# Patient Record
Sex: Female | Born: 1960
Health system: Southern US, Community
[De-identification: ages and names within clinical notes are randomized; demographics above are authoritative.]

## PROBLEM LIST (undated history)

## (undated) DIAGNOSIS — Z8619 Personal history of other infectious and parasitic diseases: Secondary | ICD-10-CM

## (undated) DIAGNOSIS — F329 Major depressive disorder, single episode, unspecified: Secondary | ICD-10-CM

## (undated) DIAGNOSIS — M2550 Pain in unspecified joint: Secondary | ICD-10-CM

## (undated) DIAGNOSIS — G2581 Restless legs syndrome: Secondary | ICD-10-CM

## (undated) DIAGNOSIS — I10 Essential (primary) hypertension: Secondary | ICD-10-CM

## (undated) DIAGNOSIS — M1990 Unspecified osteoarthritis, unspecified site: Secondary | ICD-10-CM

## (undated) DIAGNOSIS — F32A Depression, unspecified: Secondary | ICD-10-CM

## (undated) DIAGNOSIS — F419 Anxiety disorder, unspecified: Secondary | ICD-10-CM

## (undated) DIAGNOSIS — C801 Malignant (primary) neoplasm, unspecified: Secondary | ICD-10-CM

## (undated) DIAGNOSIS — R42 Dizziness and giddiness: Secondary | ICD-10-CM

## (undated) DIAGNOSIS — I509 Heart failure, unspecified: Secondary | ICD-10-CM

## (undated) DIAGNOSIS — Z8719 Personal history of other diseases of the digestive system: Secondary | ICD-10-CM

## (undated) DIAGNOSIS — E119 Type 2 diabetes mellitus without complications: Secondary | ICD-10-CM

## (undated) DIAGNOSIS — K219 Gastro-esophageal reflux disease without esophagitis: Secondary | ICD-10-CM

## (undated) DIAGNOSIS — J111 Influenza due to unidentified influenza virus with other respiratory manifestations: Secondary | ICD-10-CM

## (undated) DIAGNOSIS — Z8709 Personal history of other diseases of the respiratory system: Secondary | ICD-10-CM

## (undated) HISTORY — PX: BREAST ENHANCEMENT SURGERY: SHX7

## (undated) HISTORY — PX: OTHER SURGICAL HISTORY: SHX169

## (undated) HISTORY — PX: COLONOSCOPY: SHX174

## (undated) HISTORY — PX: TONSILLECTOMY: SUR1361

## (undated) SURGERY — Surgical Case
Anesthesia: *Unknown

---

## 1999-12-15 ENCOUNTER — Encounter: Payer: Self-pay | Admitting: Family Medicine

## 1999-12-15 ENCOUNTER — Encounter: Admission: RE | Admit: 1999-12-15 | Discharge: 1999-12-15 | Payer: Self-pay | Admitting: Family Medicine

## 2002-12-21 ENCOUNTER — Encounter: Admission: RE | Admit: 2002-12-21 | Discharge: 2003-03-21 | Payer: Self-pay | Admitting: Family Medicine

## 2003-09-08 ENCOUNTER — Encounter: Admission: RE | Admit: 2003-09-08 | Discharge: 2003-09-08 | Payer: Self-pay | Admitting: Otolaryngology

## 2004-07-07 ENCOUNTER — Other Ambulatory Visit: Admission: RE | Admit: 2004-07-07 | Discharge: 2004-07-07 | Payer: Self-pay | Admitting: Family Medicine

## 2008-07-02 ENCOUNTER — Other Ambulatory Visit: Admission: RE | Admit: 2008-07-02 | Discharge: 2008-07-02 | Payer: Self-pay | Admitting: Family Medicine

## 2009-04-22 ENCOUNTER — Encounter: Admission: RE | Admit: 2009-04-22 | Discharge: 2009-04-22 | Payer: Self-pay | Admitting: Family Medicine

## 2009-09-16 ENCOUNTER — Encounter: Admission: RE | Admit: 2009-09-16 | Discharge: 2009-09-16 | Payer: Self-pay | Admitting: Family Medicine

## 2010-01-04 ENCOUNTER — Other Ambulatory Visit
Admission: RE | Admit: 2010-01-04 | Discharge: 2010-01-04 | Payer: Self-pay | Source: Home / Self Care | Admitting: Family Medicine

## 2010-02-26 ENCOUNTER — Encounter: Payer: Self-pay | Admitting: Family Medicine

## 2010-09-18 ENCOUNTER — Emergency Department: Payer: Self-pay | Admitting: *Deleted

## 2010-09-19 ENCOUNTER — Encounter (HOSPITAL_COMMUNITY): Payer: Self-pay

## 2010-09-19 ENCOUNTER — Ambulatory Visit (HOSPITAL_COMMUNITY)
Admission: RE | Admit: 2010-09-19 | Discharge: 2010-09-19 | Disposition: A | Discharge status: Home / Self Care | Payer: BC Managed Care – PPO | Source: Ambulatory Visit | Attending: Family Medicine | Admitting: Family Medicine

## 2010-09-19 ENCOUNTER — Other Ambulatory Visit (HOSPITAL_COMMUNITY): Payer: Self-pay | Admitting: Family Medicine

## 2010-09-19 DIAGNOSIS — K7689 Other specified diseases of liver: Secondary | ICD-10-CM | POA: Insufficient documentation

## 2010-09-19 DIAGNOSIS — M47814 Spondylosis without myelopathy or radiculopathy, thoracic region: Secondary | ICD-10-CM | POA: Insufficient documentation

## 2010-09-19 DIAGNOSIS — R799 Abnormal finding of blood chemistry, unspecified: Secondary | ICD-10-CM | POA: Insufficient documentation

## 2010-09-19 DIAGNOSIS — J986 Disorders of diaphragm: Secondary | ICD-10-CM

## 2010-09-19 DIAGNOSIS — R0789 Other chest pain: Secondary | ICD-10-CM | POA: Insufficient documentation

## 2010-09-19 MED ORDER — IOHEXOL 300 MG/ML  SOLN
100.0000 mL | Freq: Once | INTRAMUSCULAR | Status: AC | PRN
  Administered 2010-09-19: 100 mL via INTRAVENOUS

## 2011-09-28 ENCOUNTER — Other Ambulatory Visit (HOSPITAL_COMMUNITY)
Admission: RE | Admit: 2011-09-28 | Discharge: 2011-09-28 | Disposition: A | Discharge status: Home / Self Care | Payer: BC Managed Care – PPO | Source: Ambulatory Visit | Attending: Family Medicine | Admitting: Family Medicine

## 2011-09-28 ENCOUNTER — Other Ambulatory Visit: Payer: Self-pay | Admitting: Family Medicine

## 2011-09-28 DIAGNOSIS — Z124 Encounter for screening for malignant neoplasm of cervix: Secondary | ICD-10-CM | POA: Insufficient documentation

## 2013-03-24 ENCOUNTER — Other Ambulatory Visit: Payer: Self-pay | Admitting: Orthopaedic Surgery

## 2013-04-06 ENCOUNTER — Other Ambulatory Visit (HOSPITAL_COMMUNITY): Payer: BC Managed Care – PPO

## 2013-04-09 ENCOUNTER — Encounter (HOSPITAL_COMMUNITY)
Admission: RE | Admit: 2013-04-09 | Discharge: 2013-04-09 | Disposition: A | Discharge status: Home / Self Care | Payer: BC Managed Care – PPO | Source: Ambulatory Visit | Attending: Orthopaedic Surgery | Admitting: Orthopaedic Surgery

## 2013-04-09 ENCOUNTER — Encounter (HOSPITAL_COMMUNITY): Payer: Self-pay

## 2013-04-09 DIAGNOSIS — Z01818 Encounter for other preprocedural examination: Secondary | ICD-10-CM | POA: Insufficient documentation

## 2013-04-09 DIAGNOSIS — Z01810 Encounter for preprocedural cardiovascular examination: Secondary | ICD-10-CM | POA: Insufficient documentation

## 2013-04-09 DIAGNOSIS — Z01812 Encounter for preprocedural laboratory examination: Secondary | ICD-10-CM | POA: Insufficient documentation

## 2013-04-09 HISTORY — DX: Personal history of other diseases of the digestive system: Z87.19

## 2013-04-09 HISTORY — DX: Depression, unspecified: F32.A

## 2013-04-09 HISTORY — DX: Personal history of other diseases of the respiratory system: Z87.09

## 2013-04-09 HISTORY — DX: Essential (primary) hypertension: I10

## 2013-04-09 HISTORY — DX: Dizziness and giddiness: R42

## 2013-04-09 HISTORY — DX: Pain in unspecified joint: M25.50

## 2013-04-09 HISTORY — DX: Influenza due to unidentified influenza virus with other respiratory manifestations: J11.1

## 2013-04-09 HISTORY — DX: Unspecified osteoarthritis, unspecified site: M19.90

## 2013-04-09 HISTORY — DX: Personal history of other infectious and parasitic diseases: Z86.19

## 2013-04-09 HISTORY — DX: Gastro-esophageal reflux disease without esophagitis: K21.9

## 2013-04-09 HISTORY — DX: Major depressive disorder, single episode, unspecified: F32.9

## 2013-04-09 LAB — CBC WITH DIFFERENTIAL/PLATELET
Basophils Absolute: 0 10*3/uL (ref 0.0–0.1)
Basophils Relative: 0 % (ref 0–1)
Eosinophils Absolute: 0.2 10*3/uL (ref 0.0–0.7)
Eosinophils Relative: 2 % (ref 0–5)
HCT: 40 % (ref 36.0–46.0)
HEMOGLOBIN: 13.6 g/dL (ref 12.0–15.0)
LYMPHS ABS: 3.5 10*3/uL (ref 0.7–4.0)
LYMPHS PCT: 34 % (ref 12–46)
MCH: 31.1 pg (ref 26.0–34.0)
MCHC: 34 g/dL (ref 30.0–36.0)
MCV: 91.5 fL (ref 78.0–100.0)
Monocytes Absolute: 0.8 10*3/uL (ref 0.1–1.0)
Monocytes Relative: 7 % (ref 3–12)
NEUTROS ABS: 5.8 10*3/uL (ref 1.7–7.7)
NEUTROS PCT: 57 % (ref 43–77)
PLATELETS: 255 10*3/uL (ref 150–400)
RBC: 4.37 MIL/uL (ref 3.87–5.11)
RDW: 13.1 % (ref 11.5–15.5)
WBC: 10.3 10*3/uL (ref 4.0–10.5)

## 2013-04-09 LAB — PROTIME-INR
INR: 0.98 (ref 0.00–1.49)
PROTHROMBIN TIME: 12.8 s (ref 11.6–15.2)

## 2013-04-09 LAB — BASIC METABOLIC PANEL
BUN: 14 mg/dL (ref 6–23)
CO2: 26 mEq/L (ref 19–32)
Calcium: 9.9 mg/dL (ref 8.4–10.5)
Chloride: 101 mEq/L (ref 96–112)
Creatinine, Ser: 0.82 mg/dL (ref 0.50–1.10)
GFR calc Af Amer: >90 mL/min (ref >90)
GFR, EST NON AFRICAN AMERICAN: 80 mL/min — AB (ref >90)
Glucose, Bld: 96 mg/dL (ref 70–99)
POTASSIUM: 4.2 meq/L (ref 3.7–5.3)
SODIUM: 141 meq/L (ref 137–147)

## 2013-04-09 LAB — ABO/RH: ABO/RH(D): O POS

## 2013-04-09 LAB — URINALYSIS, ROUTINE W REFLEX MICROSCOPIC
BILIRUBIN URINE: NEGATIVE
Glucose, UA: NEGATIVE mg/dL
Hgb urine dipstick: NEGATIVE
Ketones, ur: NEGATIVE mg/dL
Leukocytes, UA: NEGATIVE
Nitrite: NEGATIVE
PH: 6 (ref 5.0–8.0)
Protein, ur: NEGATIVE mg/dL
Specific Gravity, Urine: 1.028 (ref 1.005–1.030)
Urobilinogen, UA: 0.2 mg/dL (ref 0.0–1.0)

## 2013-04-09 LAB — SURGICAL PCR SCREEN
MRSA, PCR: POSITIVE — AB
Staphylococcus aureus: POSITIVE — AB

## 2013-04-09 LAB — HCG, SERUM, QUALITATIVE: PREG SERUM: NEGATIVE

## 2013-04-09 LAB — TYPE AND SCREEN
ABO/RH(D): O POS
ANTIBODY SCREEN: NEGATIVE

## 2013-04-09 LAB — APTT: APTT: 33 s (ref 24–37)

## 2013-04-09 MED ORDER — CHLORHEXIDINE GLUCONATE 4 % EX LIQD: 60.0000 mL | Freq: Once | CUTANEOUS | Status: DC

## 2013-04-09 NOTE — Pre-Procedure Instructions (Signed)
Traci Roy  04/09/2013   Your procedure is scheduled on:  Tues, Mar 10 @ 7:30 AM  Report to [facility] Stay Entrance A  at 5:30 AM.  Call this number if you have problems the morning of surgery: [phone]   Remember:   Do not eat food or drink liquids after midnight.      Do not wear jewelry, make-up or nail polish.  Do not wear lotions, powders, or perfumes. You may wear deodorant.  Do not shave 48 hours prior to surgery.   Do not bring valuables to the hospital.  [facility] is not responsible                  for any belongings or valuables.               Contacts, dentures or bridgework may not be worn into surgery.  Leave suitcase in the car. After surgery it may be brought to your room.  For patients admitted to the hospital, discharge time is determined by your                treatment team.               Special Instructions:  [facility] - Preparing for Surgery  Before surgery, you can play an important role.  Because skin is not sterile, your skin needs to be as free of germs as possible.  You can reduce the number of germs on you skin by washing with CHG (chlorahexidine gluconate) soap before surgery.  CHG is an antiseptic cleaner which kills germs and bonds with the skin to continue killing germs even after washing.  Please DO NOT use if you have an allergy to CHG or antibacterial soaps.  If your skin becomes reddened/irritated stop using the CHG and inform your nurse when you arrive at Short Stay.  Do not shave (including legs and underarms) for at least 48 hours prior to the first CHG shower.  You may shave your face.  Please follow these instructions carefully:   1.  Shower with CHG Soap the night before surgery and the                                morning of Surgery.  2.  If you choose to wash your hair, wash your hair first as usual with your       normal shampoo.  3.  After you shampoo, rinse your hair and body thoroughly to remove the                       Shampoo.  4.  Use CHG as you would any other liquid soap.  You can apply chg directly       to the skin and wash gently with scrungie or a clean washcloth.  5.  Apply the CHG Soap to your body ONLY FROM THE NECK DOWN.        Do not use on open wounds or open sores.  Avoid contact with your eyes,       ears, mouth and genitals (private parts).  Wash genitals (private parts)       with your normal soap.  6.  Wash thoroughly, paying special attention to the area where your surgery        will be performed.  7.  Thoroughly rinse your body with warm  water from the neck down.  8.  DO NOT shower/wash with your normal soap after using and rinsing off       the CHG Soap.  9.  Pat yourself dry with a clean towel.            10.  Wear clean pajamas.            11.  Place clean sheets on your bed the night of your first shower and do not        sleep with pets.  Day of Surgery  Do not apply any lotions/deoderants the morning of surgery.  Please wear clean clothes to the hospital/surgery center.     Please read over the following fact sheets that you were given: Pain Booklet, Coughing and Deep Breathing, Blood Transfusion Information, MRSA Information and Surgical Site Infection Prevention

## 2013-04-09 NOTE — Progress Notes (Signed)
Pt doesn't have a cardiologist  Denies ever having an echo/stress test/heart cath  Denies ever having an EKG  Denies CXR in past yr  Medical MD is Dr.Webb at [facility]

## 2013-04-09 NOTE — Progress Notes (Signed)
04/09/13 1308  OBSTRUCTIVE SLEEP APNEA  Have you ever been diagnosed with sleep apnea through a sleep study? No  Do you snore loudly (loud enough to be heard through closed doors)?  1  Do you often feel tired, fatigued, or sleepy during the daytime? 0  Has anyone observed you stop breathing during your sleep? 0  Do you have, or are you being treated for high blood pressure? 1  BMI more than 35 kg/m2? 1  Age over 53 years old? 1  Neck circumference greater than 40 cm/18 inches? 1 (18 1/2)  Gender: 0  Obstructive Sleep Apnea Score 5  Score 4 or greater  Results sent to PCP

## 2013-04-10 NOTE — H&P (Signed)
TOTAL HIP ADMISSION H&P  Patient is admitted for right total hip arthroplasty.  Subjective:  Chief Complaint: right hip pain  HPI: Traci Roy, 53 y.o. female, has a history of pain and functional disability in the right hip(s) due to arthritis and patient has failed non-surgical conservative treatments for greater than 12 weeks to include NSAID's and/or analgesics, supervised PT with diminished ADL's post treatment, use of assistive devices, weight reduction as appropriate and activity modification.  Onset of symptoms was gradual starting 6 years ago with gradually worsening course since that time.The patient noted no past surgery on the right hip(s).  Patient currently rates pain in the right hip at 9 out of 10 with activity. Patient has night pain, worsening of pain with activity and weight bearing, trendelenberg gait, pain that interfers with activities of daily living and pain with passive range of motion. Patient has evidence of subchondral sclerosis, periarticular osteophytes and joint space narrowing by imaging studies. This condition presents safety issues increasing the risk of falls. This patient has had no.  There is no current active infection.  There are no active problems to display for this patient.  Past Medical History  Diagnosis Date  . Hypertension     takes Losartan and HCTZ daily  . History of bronchitis     last time many yrs ago  . Flu     end of Dec 2014  . Dizziness     occasionally and no meds  . Numbness     right leg  . Arthritis   . Joint pain   . GERD (gastroesophageal reflux disease)     occasionally and no meds required  . History of duodenal ulcer   . Depression     takes Lexapro daily  . History of shingles     Past Surgical History  Procedure Laterality Date  . Enlarged bladder as a child    . Breast enhancement surgery    . Tonsillectomy    . Cesarean section    . Colonoscopy    . Esophagogastroduodenoscopy      No prescriptions prior  to admission   Allergies  Allergen Reactions  . Adhesive [Tape]     Itching and breaks out    History  Substance Use Topics  . Smoking status: Never Smoker   . Smokeless tobacco: Not on file  . Alcohol Use: Yes     Comment: glass of wine every now and then    No family history on file.   Review of Systems  Constitutional: Negative.   HENT: Negative.   Eyes: Negative.   Respiratory: Negative.   Cardiovascular: Negative.   Gastrointestinal: Negative.   Genitourinary: Negative.   Musculoskeletal: Positive for joint pain.  Skin: Negative.   Neurological: Negative.   Endo/Heme/Allergies: Negative.   Psychiatric/Behavioral: Negative.     Objective:  Physical Exam  Constitutional: She appears well-developed.  HENT:  Head: Normocephalic.  Eyes: Pupils are equal, round, and reactive to light.  Neck: Normal range of motion.  Cardiovascular: Normal rate.   Respiratory: Effort normal.  GI: Soft.  Musculoskeletal:  Right hip exam: Very tender with any attempts of rotation.  Leg lengths equal.  No contraction.  Good sensory motor function.  Negative Homans.  Neurological: She is alert.  Skin: Skin is warm.  Psychiatric: She has a normal mood and affect.    Vital signs in last 24 hours:    Labs:   There is no height or weight on file  to calculate BMI.   Imaging Review Plain radiographs demonstrate severe degenerative joint disease of the right hip(s). The bone quality appears to be good for age and reported activity level.  Assessment/Plan:  End stage arthritis, right hip(s)  The patient history, physical examination, clinical judgement of the provider and imaging studies are consistent with end stage degenerative joint disease of the right hip(s) and total hip arthroplasty is deemed medically necessary. The treatment options including medical management, injection therapy, arthroscopy and arthroplasty were discussed at length. The risks and benefits of total hip  arthroplasty were presented and reviewed. The risks due to aseptic loosening, infection, stiffness, dislocation/subluxation,  thromboembolic complications and other imponderables were discussed.  The patient acknowledged the explanation, agreed to proceed with the plan and consent was signed. Patient is being admitted for inpatient treatment for surgery, pain control, PT, OT, prophylactic antibiotics, VTE prophylaxis, progressive ambulation and ADL's and discharge planning.The patient is planning to be discharged home with home health services

## 2013-04-13 MED ORDER — CEFAZOLIN SODIUM-DEXTROSE 2-3 GM-% IV SOLR
2.0000 g | INTRAVENOUS | Status: AC
  Administered 2013-04-14: 2 g via INTRAVENOUS
  Filled 2013-04-13: qty 50

## 2013-04-14 ENCOUNTER — Inpatient Hospital Stay (HOSPITAL_COMMUNITY)
Admission: RE | Admit: 2013-04-14 | Discharge: 2013-04-16 | DRG: 470 | Disposition: A | Discharge status: Home / Self Care | Payer: BC Managed Care – PPO | Source: Ambulatory Visit | Attending: Orthopaedic Surgery | Admitting: Orthopaedic Surgery

## 2013-04-14 ENCOUNTER — Encounter (HOSPITAL_COMMUNITY): Payer: Self-pay | Admitting: Anesthesiology

## 2013-04-14 ENCOUNTER — Inpatient Hospital Stay (HOSPITAL_COMMUNITY): Payer: BC Managed Care – PPO

## 2013-04-14 ENCOUNTER — Encounter (HOSPITAL_COMMUNITY): Payer: BC Managed Care – PPO | Admitting: Anesthesiology

## 2013-04-14 ENCOUNTER — Encounter (HOSPITAL_COMMUNITY)
Admission: RE | Disposition: A | Discharge status: Home / Self Care | Payer: Self-pay | Source: Ambulatory Visit | Attending: Orthopaedic Surgery

## 2013-04-14 ENCOUNTER — Inpatient Hospital Stay (HOSPITAL_COMMUNITY): Payer: BC Managed Care – PPO | Admitting: Anesthesiology

## 2013-04-14 DIAGNOSIS — M1610 Unilateral primary osteoarthritis, unspecified hip: Principal | ICD-10-CM | POA: Diagnosis present

## 2013-04-14 DIAGNOSIS — F3289 Other specified depressive episodes: Secondary | ICD-10-CM | POA: Diagnosis present

## 2013-04-14 DIAGNOSIS — K219 Gastro-esophageal reflux disease without esophagitis: Secondary | ICD-10-CM | POA: Diagnosis present

## 2013-04-14 DIAGNOSIS — I10 Essential (primary) hypertension: Secondary | ICD-10-CM | POA: Diagnosis present

## 2013-04-14 DIAGNOSIS — M169 Osteoarthritis of hip, unspecified: Secondary | ICD-10-CM | POA: Diagnosis present

## 2013-04-14 DIAGNOSIS — F329 Major depressive disorder, single episode, unspecified: Secondary | ICD-10-CM | POA: Diagnosis present

## 2013-04-14 HISTORY — PX: TOTAL HIP ARTHROPLASTY: SHX124

## 2013-04-14 SURGERY — ARTHROPLASTY, HIP, TOTAL, ANTERIOR APPROACH
Anesthesia: General | Site: Hip | Laterality: Right

## 2013-04-14 MED ORDER — LOSARTAN POTASSIUM 50 MG PO TABS
50.0000 mg | ORAL_TABLET | Freq: Every day | ORAL | Status: DC
  Administered 2013-04-16: 50 mg via ORAL
  Filled 2013-04-14 (×3): qty 1

## 2013-04-14 MED ORDER — MUPIROCIN 2 % EX OINT
TOPICAL_OINTMENT | Freq: Once | CUTANEOUS | Status: AC
  Administered 2013-04-14: 06:00:00 via NASAL

## 2013-04-14 MED ORDER — LACTATED RINGERS IV SOLN
INTRAVENOUS | Status: DC
  Administered 2013-04-14 – 2013-04-15 (×2): via INTRAVENOUS

## 2013-04-14 MED ORDER — KETOROLAC TROMETHAMINE 30 MG/ML IJ SOLN
15.0000 mg | Freq: Once | INTRAMUSCULAR | Status: AC | PRN
  Administered 2013-04-14: 30 mg via INTRAVENOUS

## 2013-04-14 MED ORDER — ONDANSETRON HCL 4 MG/2ML IJ SOLN
INTRAMUSCULAR | Status: DC | PRN
  Administered 2013-04-14: 4 mg via INTRAVENOUS

## 2013-04-14 MED ORDER — ONDANSETRON HCL 4 MG PO TABS: 4.0000 mg | ORAL_TABLET | Freq: Four times a day (QID) | ORAL | Status: DC | PRN

## 2013-04-14 MED ORDER — OXYCODONE HCL 5 MG/5ML PO SOLN: 5.0000 mg | Freq: Once | ORAL | Status: DC | PRN

## 2013-04-14 MED ORDER — ONDANSETRON HCL 4 MG/2ML IJ SOLN
4.0000 mg | Freq: Four times a day (QID) | INTRAMUSCULAR | Status: DC | PRN
  Administered 2013-04-14: 4 mg via INTRAVENOUS
  Filled 2013-04-14: qty 2

## 2013-04-14 MED ORDER — FENTANYL CITRATE 0.05 MG/ML IJ SOLN
INTRAMUSCULAR | Status: DC | PRN
  Administered 2013-04-14 (×4): 50 ug via INTRAVENOUS
  Administered 2013-04-14: 200 ug via INTRAVENOUS
  Administered 2013-04-14 (×2): 50 ug via INTRAVENOUS

## 2013-04-14 MED ORDER — GLYCOPYRROLATE 0.2 MG/ML IJ SOLN
INTRAMUSCULAR | Status: AC
  Filled 2013-04-14: qty 2

## 2013-04-14 MED ORDER — PHENYLEPHRINE 40 MCG/ML (10ML) SYRINGE FOR IV PUSH (FOR BLOOD PRESSURE SUPPORT)
PREFILLED_SYRINGE | INTRAVENOUS | Status: AC
  Filled 2013-04-14: qty 10

## 2013-04-14 MED ORDER — PHENYLEPHRINE HCL 10 MG/ML IJ SOLN
INTRAMUSCULAR | Status: DC | PRN
  Administered 2013-04-14 (×5): 80 ug via INTRAVENOUS

## 2013-04-14 MED ORDER — EPHEDRINE SULFATE 50 MG/ML IJ SOLN
INTRAMUSCULAR | Status: DC | PRN
  Administered 2013-04-14 (×2): 10 mg via INTRAVENOUS

## 2013-04-14 MED ORDER — PROPOFOL 10 MG/ML IV BOLUS
INTRAVENOUS | Status: DC | PRN
  Administered 2013-04-14: 200 mg via INTRAVENOUS

## 2013-04-14 MED ORDER — ONDANSETRON HCL 4 MG/2ML IJ SOLN
INTRAMUSCULAR | Status: AC
  Filled 2013-04-14: qty 2

## 2013-04-14 MED ORDER — ACETAMINOPHEN 650 MG RE SUPP: 650.0000 mg | Freq: Four times a day (QID) | RECTAL | Status: DC | PRN

## 2013-04-14 MED ORDER — HYDROCODONE-ACETAMINOPHEN 5-325 MG PO TABS
ORAL_TABLET | ORAL | Status: AC
  Administered 2013-04-14: 2 via ORAL
  Filled 2013-04-14: qty 2

## 2013-04-14 MED ORDER — ACETAMINOPHEN 500 MG PO TABS
1000.0000 mg | ORAL_TABLET | Freq: Four times a day (QID) | ORAL | Status: AC
  Administered 2013-04-14 – 2013-04-15 (×3): 1000 mg via ORAL
  Filled 2013-04-14 (×3): qty 2

## 2013-04-14 MED ORDER — FENTANYL CITRATE 0.05 MG/ML IJ SOLN
INTRAMUSCULAR | Status: AC
  Filled 2013-04-14: qty 5

## 2013-04-14 MED ORDER — BISACODYL 5 MG PO TBEC: 5.0000 mg | DELAYED_RELEASE_TABLET | Freq: Every day | ORAL | Status: DC | PRN

## 2013-04-14 MED ORDER — HYDROCODONE-ACETAMINOPHEN 5-325 MG PO TABS
1.0000 | ORAL_TABLET | ORAL | Status: DC | PRN
  Administered 2013-04-14: 2 via ORAL
  Administered 2013-04-15 – 2013-04-16 (×5): 1 via ORAL
  Filled 2013-04-14 (×5): qty 1

## 2013-04-14 MED ORDER — METHOCARBAMOL 100 MG/ML IJ SOLN
500.0000 mg | Freq: Four times a day (QID) | INTRAVENOUS | Status: DC | PRN
  Administered 2013-04-14: 500 mg via INTRAVENOUS
  Filled 2013-04-14 (×2): qty 5

## 2013-04-14 MED ORDER — ROCURONIUM BROMIDE 50 MG/5ML IV SOLN
INTRAVENOUS | Status: AC
  Filled 2013-04-14: qty 1

## 2013-04-14 MED ORDER — 0.9 % SODIUM CHLORIDE (POUR BTL) OPTIME
TOPICAL | Status: DC | PRN
  Administered 2013-04-14: 1000 mL

## 2013-04-14 MED ORDER — LIDOCAINE HCL (CARDIAC) 20 MG/ML IV SOLN
INTRAVENOUS | Status: AC
  Filled 2013-04-14: qty 5

## 2013-04-14 MED ORDER — DIPHENHYDRAMINE HCL 12.5 MG/5ML PO ELIX: 12.5000 mg | ORAL_SOLUTION | ORAL | Status: DC | PRN

## 2013-04-14 MED ORDER — HYDROMORPHONE HCL PF 1 MG/ML IJ SOLN
0.5000 mg | INTRAMUSCULAR | Status: DC | PRN
  Administered 2013-04-14 – 2013-04-15 (×3): 0.5 mg via INTRAVENOUS
  Filled 2013-04-14 (×3): qty 1

## 2013-04-14 MED ORDER — MUPIROCIN 2 % EX OINT
TOPICAL_OINTMENT | CUTANEOUS | Status: AC
  Filled 2013-04-14: qty 22

## 2013-04-14 MED ORDER — METOCLOPRAMIDE HCL 5 MG/ML IJ SOLN
5.0000 mg | Freq: Three times a day (TID) | INTRAMUSCULAR | Status: DC | PRN
  Administered 2013-04-14: 10 mg via INTRAVENOUS
  Filled 2013-04-14: qty 2

## 2013-04-14 MED ORDER — FERROUS SULFATE 325 (65 FE) MG PO TABS
325.0000 mg | ORAL_TABLET | Freq: Two times a day (BID) | ORAL | Status: DC
  Administered 2013-04-15 – 2013-04-16 (×3): 325 mg via ORAL
  Filled 2013-04-14 (×6): qty 1

## 2013-04-14 MED ORDER — LACTATED RINGERS IV SOLN
INTRAVENOUS | Status: DC | PRN
  Administered 2013-04-14 (×2): via INTRAVENOUS

## 2013-04-14 MED ORDER — TRANEXAMIC ACID 100 MG/ML IV SOLN
1000.0000 mg | INTRAVENOUS | Status: AC
  Administered 2013-04-14: 1000 mg via INTRAVENOUS
  Filled 2013-04-14: qty 10

## 2013-04-14 MED ORDER — PROMETHAZINE HCL 25 MG/ML IJ SOLN: 6.2500 mg | INTRAMUSCULAR | Status: DC | PRN

## 2013-04-14 MED ORDER — ASPIRIN EC 325 MG PO TBEC
325.0000 mg | DELAYED_RELEASE_TABLET | Freq: Two times a day (BID) | ORAL | Status: DC
  Administered 2013-04-15 – 2013-04-16 (×3): 325 mg via ORAL
  Filled 2013-04-14 (×5): qty 1

## 2013-04-14 MED ORDER — PROPOFOL 10 MG/ML IV BOLUS
INTRAVENOUS | Status: AC
  Filled 2013-04-14: qty 20

## 2013-04-14 MED ORDER — CEFAZOLIN SODIUM-DEXTROSE 2-3 GM-% IV SOLR
2.0000 g | Freq: Four times a day (QID) | INTRAVENOUS | Status: AC
  Administered 2013-04-14 (×2): 2 g via INTRAVENOUS
  Filled 2013-04-14 (×2): qty 50

## 2013-04-14 MED ORDER — MIDAZOLAM HCL 2 MG/2ML IJ SOLN: 0.5000 mg | Freq: Once | INTRAMUSCULAR | Status: DC | PRN

## 2013-04-14 MED ORDER — HYDROMORPHONE HCL PF 1 MG/ML IJ SOLN
0.2500 mg | INTRAMUSCULAR | Status: DC | PRN
  Administered 2013-04-14 (×2): 0.25 mg via INTRAVENOUS

## 2013-04-14 MED ORDER — OXYCODONE HCL 5 MG PO TABS: 5.0000 mg | ORAL_TABLET | Freq: Once | ORAL | Status: DC | PRN

## 2013-04-14 MED ORDER — MIDAZOLAM HCL 2 MG/2ML IJ SOLN
INTRAMUSCULAR | Status: AC
  Filled 2013-04-14: qty 2

## 2013-04-14 MED ORDER — MEPERIDINE HCL 25 MG/ML IJ SOLN: 6.2500 mg | INTRAMUSCULAR | Status: DC | PRN

## 2013-04-14 MED ORDER — HYDROMORPHONE HCL PF 1 MG/ML IJ SOLN: 0.2500 mg | INTRAMUSCULAR | Status: DC | PRN

## 2013-04-14 MED ORDER — HYDROCHLOROTHIAZIDE 25 MG PO TABS
25.0000 mg | ORAL_TABLET | Freq: Every day | ORAL | Status: DC
  Administered 2013-04-16: 25 mg via ORAL
  Filled 2013-04-14 (×3): qty 1

## 2013-04-14 MED ORDER — HYDROMORPHONE HCL PF 1 MG/ML IJ SOLN
INTRAMUSCULAR | Status: AC
  Filled 2013-04-14: qty 1

## 2013-04-14 MED ORDER — LACTATED RINGERS IV SOLN: INTRAVENOUS | Status: DC

## 2013-04-14 MED ORDER — ALUM & MAG HYDROXIDE-SIMETH 200-200-20 MG/5ML PO SUSP: 30.0000 mL | ORAL | Status: DC | PRN

## 2013-04-14 MED ORDER — ACETAMINOPHEN 325 MG PO TABS: 650.0000 mg | ORAL_TABLET | Freq: Four times a day (QID) | ORAL | Status: DC | PRN

## 2013-04-14 MED ORDER — DOCUSATE SODIUM 100 MG PO CAPS
100.0000 mg | ORAL_CAPSULE | Freq: Two times a day (BID) | ORAL | Status: DC
  Administered 2013-04-14 – 2013-04-16 (×4): 100 mg via ORAL
  Filled 2013-04-14 (×5): qty 1

## 2013-04-14 MED ORDER — LIDOCAINE HCL (CARDIAC) 20 MG/ML IV SOLN
INTRAVENOUS | Status: DC | PRN
  Administered 2013-04-14: 70 mg via INTRAVENOUS

## 2013-04-14 MED ORDER — EPHEDRINE SULFATE 50 MG/ML IJ SOLN
INTRAMUSCULAR | Status: AC
  Filled 2013-04-14: qty 1

## 2013-04-14 MED ORDER — MIDAZOLAM HCL 5 MG/5ML IJ SOLN
INTRAMUSCULAR | Status: DC | PRN
  Administered 2013-04-14: 2 mg via INTRAVENOUS

## 2013-04-14 MED ORDER — METOCLOPRAMIDE HCL 10 MG PO TABS: 5.0000 mg | ORAL_TABLET | Freq: Three times a day (TID) | ORAL | Status: DC | PRN

## 2013-04-14 MED ORDER — KETOROLAC TROMETHAMINE 30 MG/ML IJ SOLN
INTRAMUSCULAR | Status: AC
  Filled 2013-04-14: qty 1

## 2013-04-14 MED ORDER — PROPRANOLOL HCL 1 MG/ML IV SOLN
INTRAVENOUS | Status: AC
  Filled 2013-04-14: qty 1

## 2013-04-14 MED ORDER — ESCITALOPRAM OXALATE 10 MG PO TABS
10.0000 mg | ORAL_TABLET | Freq: Every day | ORAL | Status: DC
  Administered 2013-04-15 – 2013-04-16 (×2): 10 mg via ORAL
  Filled 2013-04-14 (×3): qty 1

## 2013-04-14 MED ORDER — NEOSTIGMINE METHYLSULFATE 1 MG/ML IJ SOLN
INTRAMUSCULAR | Status: AC
  Filled 2013-04-14: qty 10

## 2013-04-14 MED ORDER — MUPIROCIN 2 % EX OINT: TOPICAL_OINTMENT | Freq: Two times a day (BID) | CUTANEOUS | Status: DC

## 2013-04-14 MED ORDER — PHENOL 1.4 % MT LIQD: 1.0000 | OROMUCOSAL | Status: DC | PRN

## 2013-04-14 MED ORDER — NEOSTIGMINE METHYLSULFATE 1 MG/ML IJ SOLN
INTRAMUSCULAR | Status: DC | PRN
  Administered 2013-04-14: 3 mg via INTRAVENOUS

## 2013-04-14 MED ORDER — METHOCARBAMOL 500 MG PO TABS
500.0000 mg | ORAL_TABLET | Freq: Four times a day (QID) | ORAL | Status: DC | PRN
  Administered 2013-04-14 – 2013-04-15 (×4): 500 mg via ORAL
  Filled 2013-04-14 (×5): qty 1

## 2013-04-14 MED ORDER — MENTHOL 3 MG MT LOZG: 1.0000 | LOZENGE | OROMUCOSAL | Status: DC | PRN

## 2013-04-14 MED ORDER — ROCURONIUM BROMIDE 100 MG/10ML IV SOLN
INTRAVENOUS | Status: DC | PRN
  Administered 2013-04-14: 10 mg via INTRAVENOUS
  Administered 2013-04-14: 40 mg via INTRAVENOUS

## 2013-04-14 MED ORDER — GLYCOPYRROLATE 0.2 MG/ML IJ SOLN
INTRAMUSCULAR | Status: DC | PRN
  Administered 2013-04-14: 0.4 mg via INTRAVENOUS

## 2013-04-14 SURGICAL SUPPLY — 51 items
BLADE SAW SGTL 18X1.27X75 (BLADE) ×2 IMPLANT
BLADE SURG ROTATE 9660 (MISCELLANEOUS) IMPLANT
CAPT HIP PF COP ×1 IMPLANT
CELLS DAT CNTRL 66122 CELL SVR (MISCELLANEOUS) ×1 IMPLANT
COVER SURGICAL LIGHT HANDLE (MISCELLANEOUS) ×2 IMPLANT
DRAPE C-ARM 42X72 X-RAY (DRAPES) ×2 IMPLANT
DRAPE STERI IOBAN 125X83 (DRAPES) ×2 IMPLANT
DRAPE U-SHAPE 47X51 STRL (DRAPES) ×6 IMPLANT
DRSG AQUACEL AG ADV 3.5X10 (GAUZE/BANDAGES/DRESSINGS) ×2 IMPLANT
DURAPREP 26ML APPLICATOR (WOUND CARE) ×2 IMPLANT
ELECT BLADE 4.0 EZ CLEAN MEGAD (MISCELLANEOUS)
ELECT CAUTERY BLADE 6.4 (BLADE) ×2 IMPLANT
ELECT REM PT RETURN 9FT ADLT (ELECTROSURGICAL) ×2
ELECTRODE BLDE 4.0 EZ CLN MEGD (MISCELLANEOUS) IMPLANT
ELECTRODE REM PT RTRN 9FT ADLT (ELECTROSURGICAL) ×1 IMPLANT
FACESHIELD LNG OPTICON STERILE (SAFETY) ×4 IMPLANT
GLOVE BIO SURGEON STRL SZ7 (GLOVE) ×1 IMPLANT
GLOVE BIO SURGEON STRL SZ8 (GLOVE) ×2 IMPLANT
GLOVE BIO SURGEON STRL SZ8.5 (GLOVE) ×2 IMPLANT
GLOVE BIOGEL M 7.0 STRL (GLOVE) ×1 IMPLANT
GLOVE BIOGEL PI IND STRL 7.0 (GLOVE) IMPLANT
GLOVE BIOGEL PI IND STRL 8 (GLOVE) ×1 IMPLANT
GLOVE BIOGEL PI IND STRL 8.5 (GLOVE) ×1 IMPLANT
GLOVE BIOGEL PI INDICATOR 7.0 (GLOVE) ×1
GLOVE BIOGEL PI INDICATOR 8 (GLOVE) ×1
GLOVE BIOGEL PI INDICATOR 8.5 (GLOVE) ×1
GOWN STRL REUS W/ TWL LRG LVL3 (GOWN DISPOSABLE) ×2 IMPLANT
GOWN STRL REUS W/ TWL XL LVL3 (GOWN DISPOSABLE) ×1 IMPLANT
GOWN STRL REUS W/TWL LRG LVL3 (GOWN DISPOSABLE) ×4
GOWN STRL REUS W/TWL XL LVL3 (GOWN DISPOSABLE) ×4
KIT BASIN OR (CUSTOM PROCEDURE TRAY) ×2 IMPLANT
KIT ROOM TURNOVER OR (KITS) ×2 IMPLANT
MANIFOLD NEPTUNE II (INSTRUMENTS) ×2 IMPLANT
NS IRRIG 1000ML POUR BTL (IV SOLUTION) ×2 IMPLANT
PACK TOTAL JOINT (CUSTOM PROCEDURE TRAY) ×2 IMPLANT
PAD ARMBOARD 7.5X6 YLW CONV (MISCELLANEOUS) ×4 IMPLANT
RETRACTOR WND ALEXIS 18 MED (MISCELLANEOUS) ×1 IMPLANT
RTRCTR WOUND ALEXIS 18CM MED (MISCELLANEOUS) ×2
STAPLER VISISTAT 35W (STAPLE) ×2 IMPLANT
SUT ETHIBOND NAB CT1 #1 30IN (SUTURE) ×4 IMPLANT
SUT VIC AB 0 CT1 27 (SUTURE)
SUT VIC AB 0 CT1 27XBRD ANBCTR (SUTURE) IMPLANT
SUT VIC AB 1 CT1 27 (SUTURE) ×2
SUT VIC AB 1 CT1 27XBRD ANBCTR (SUTURE) ×1 IMPLANT
SUT VIC AB 2-0 CT1 27 (SUTURE) ×2
SUT VIC AB 2-0 CT1 TAPERPNT 27 (SUTURE) ×1 IMPLANT
SUT VLOC 180 0 24IN GS25 (SUTURE) ×2 IMPLANT
TOWEL OR 17X24 6PK STRL BLUE (TOWEL DISPOSABLE) ×2 IMPLANT
TOWEL OR 17X26 10 PK STRL BLUE (TOWEL DISPOSABLE) ×4 IMPLANT
TRAY FOLEY CATH 14FR (SET/KITS/TRAYS/PACK) IMPLANT
WATER STERILE IRR 1000ML POUR (IV SOLUTION) ×4 IMPLANT

## 2013-04-14 NOTE — Progress Notes (Signed)
Utilization review completed.  

## 2013-04-14 NOTE — Addendum Note (Signed)
Addendum created 04/14/13 1131 by Purvis Kilts, CRNA   Modules edited: Anesthesia Medication Administration

## 2013-04-14 NOTE — Op Note (Signed)
PRE-OP DIAGNOSIS:  RIGHT HIP DEGENERATIVE JOINT DISEASE POST-OP DIAGNOSIS:  same PROCEDURE: RIGHT TOTAL HIP ARTHROPLASTY ANTERIOR APPROACH ANESTHESIA:  General SURGEON:  Melrose Nakayama MD ASSISTANT:  Loni Dolly PA-C   INDICATIONS FOR PROCEDURE:  The patient is a 53 y.o. female with a long history of a painful hip.  This has persisted despite multiple conservative measures.  The patient has persisted with pain and dysfunction making rest and activity difficult.  A total hip replacement is offered as surgical treatment.  Informed operative consent was obtained after discussion of possible complications including reaction to anesthesia, infection, neurovascular injury, dislocation, DVT, PE, and death.  The importance of the postoperative rehab program to optimize result was stressed with the patient.  SUMMARY OF FINDINGS AND PROCEDURE:  Under general anesthesia through a anterior approach an the Hana table a right THR was performed.  The patient had severe degenerative change and excellent bone quality.  We used DePuy components to replace the hip and these were size KA10 Corail femur capped with a +1 65mm ceramic hip ball.  On the acetabular side we used a size 48 Gription shell with a  plus 4 neutral polyethylene liner.  We did use a hole eliminator.  Loni Dolly PA-C assisted throughout and was invaluable to the completion of the case in that he helped position and retract while I performed the procedure.  He also closed simultaneously to help minimize OR time.  I used fluoroscopy throughout the case to check position of implants and leg lengths and read all of these views myself.  DESCRIPTION OF PROCEDURE:  The patient was taken to the OR suite where general anesthetic was applied.  The patient was then positioned on the Hana table supine.  All bony prominences were appropriately padded.  Prep and drape was then performed in normal sterile fashion.  The patient was given Kefzol preoperative antibiotic  and an appropriate time out was performed.  We then took an anterior approach to the right hip.  Dissection was taken through adipose to the tensor fascia lata fascia.  This structure was incised longitudinally and we dissected in the intermuscular interval just medial to this muscle.  Cobra retractors were placed superior and inferior to the femoral neck superficial to the capsule.  A capsular incision was then made and the retractors were placed along the femoral neck.  Xray was brought in to get a good level for the femoral neck cut which was made with an oscillating saw and osteotome.  The femoral head was removed with a corkscrew.  The acetabulum was exposed and some labral tissues were excised. Reaming was taken to the inside wall of the pelvis and sequentially up to 1 mm smaller than the actual component.  A trial of components was done and then the aforementioned acetabular shell was placed in appropriate tilt and anteversion confirmed by fluoroscopy. The liner was placed along with the hole eliminator and attention was turned to the femur.  The leg was brought down and over into adduction and the elevator bar was used to raise the femur up gently in the wound.  The piriformis was released with care taken to preserve the obturator internus attachment and all of the posterior capsule. The femur was reamed and then broached to the appropriate size.  A trial reduction was done and the aforementioned head and neck assembly gave [place] the best stability in extension with external rotation.  Leg lengths were felt to be about equal by fluoroscopic exam.  The trial components were removed and the wound irrigated.  We then placed the femoral component in appropriate anteversion.  The head was applied to a dry stem neck and the hip again reduced.  It was again stable in the aforementioned position.  The would was irrigated again followed by re-approximation of anterior capsule with ethibond suture. Tensor fascia was  repaired with V-loc suture  followed by subcutaneous closure with #O and #2 undyed vicryl.  Skin was closed with staples followed by a sterile dressing.  EBL and IOF can be obtained from anesthesia records.  DISPOSITION:  The patient was extubated in the OR and taken to PACU in stable condition to be admitted to the Orthopedic Surgery for appropriate post-op care to include perioperative antibiotics and DVT prophylaxis.

## 2013-04-14 NOTE — Interval H&P Note (Signed)
History and Physical Interval Note:  04/14/2013 7:25 AM  Traci Roy  has presented today for surgery, with the diagnosis of RIGHT HIP DEGENERATIVE JOINT DISEASE  The various methods of treatment have been discussed with the patient and family. After consideration of risks, benefits and other options for treatment, the patient has consented to  Procedure(s): TOTAL HIP ARTHROPLASTY ANTERIOR APPROACH (Right) as a surgical intervention .  The patient's history has been reviewed, patient examined, no change in status, stable for surgery.  I have reviewed the patient's chart and labs.  Questions were answered to the patient's satisfaction.     Taunja Brickner G

## 2013-04-14 NOTE — Evaluation (Signed)
Physical Therapy Evaluation Patient Details Name: Traci Roy MRN: [id number] DOB: 04/30/1960 Today's Date: 04/14/2013 Time: 8676-7209 PT Time Calculation (min): 34 min  PT Assessment / Plan / Recommendation History of Present Illness  Pt is a 53 y/o female admitted s/p THA anterior approach.   Clinical Impression  This patient presents with acute pain and decreased functional independence following the above mentioned procedure. At the time of PT eval, pt was limited by N/V and induced vomiting with finger down her throat to gain some relief. Functionally, pt did well with transfer from bed>chair. This patient is appropriate for skilled PT interventions to address functional limitations, improve safety and independence with functional mobility, and return to PLOF.     PT Assessment  Patient needs continued PT services    Follow Up Recommendations  Home health PT    Does the patient have the potential to tolerate intense rehabilitation      Barriers to Discharge        Equipment Recommendations  Rolling walker with 5" wheels;3in1 (PT)    Recommendations for Other Services     Frequency 7X/week    Precautions / Restrictions Precautions Precautions: Fall Precaution Comments: Direct anterior hip Restrictions Weight Bearing Restrictions: Yes RLE Weight Bearing: Weight bearing as tolerated   Pertinent Vitals/Pain Main complaint during session was N/V, and complained of minimal pain throughout       Mobility  Bed Mobility Overal bed mobility: Needs Assistance Bed Mobility: Supine to Sit Supine to sit: Min assist General bed mobility comments: VC's for sequencing and technique. Assist for movement and support of RLE while pt transitioned to EOB.  Transfers Overall transfer level: Needs assistance Equipment used: Rolling walker (2 wheeled) Transfers: Sit to/from [facility] Sit to Stand: Min assist Stand pivot transfers: Min assist General transfer  comment: VC's for hand placement on seated surface for safety. Assist to power-up to full stand, as well as for walker positioning while pivoting from bed to chair.     Exercises     PT Diagnosis: Difficulty walking;Acute pain  PT Problem List: Decreased strength;Decreased range of motion;Decreased activity tolerance;Decreased balance;Decreased mobility;Decreased knowledge of use of DME;Decreased safety awareness;Pain PT Treatment Interventions: Gait training;Stair training;DME instruction;Therapeutic activities;Functional mobility training;Therapeutic exercise;Neuromuscular re-education;Patient/family education     PT Goals(Current goals can be found in the care plan section) Acute Rehab PT Goals Patient Stated Goal: To feel better PT Goal Formulation: With patient/family Time For Goal Achievement: 04/28/13 Potential to Achieve Goals: Good  Visit Information  Last PT Received On: 04/14/13 Assistance Needed: +1 History of Present Illness: Pt is a 53 y/o female admitted s/p THA anterior approach.        Prior [place] expects to be discharged to:: Private residence Living Arrangements: Spouse/significant other Available Help at Discharge: Friend(s);Available 24 hours/day Type of Home: House Home Access: Stairs to enter [employer] of Steps: 4 Entrance Stairs-Rails: Right;Left Home Layout: One level Home Equipment: None Prior Function Level of Independence: Needs assistance ADL's / Homemaking Assistance Needed: Putting socks and shoes on Communication Communication: No difficulties Dominant Hand: Right    Cognition  Cognition Arousal/Alertness: Awake/alert Behavior During Therapy: WFL for tasks assessed/performed Overall Cognitive Status: Within Functional Limits for tasks assessed    Extremity/Trunk Assessment Upper Extremity Assessment Upper Extremity Assessment: Defer to OT evaluation Lower Extremity Assessment Lower  Extremity Assessment: RLE deficits/detail RLE Deficits / Details: Decreased strength and AROM consistent with THA RLE: Unable to fully assess due to  pain Cervical / Trunk Assessment Cervical / Trunk Assessment: Normal   Balance General Comments General comments (skin integrity, edema, etc.): Pt very nauseated during session and stuck finger down throat to initiate vomiting. Therapist encouraged pt not to induce vomiting but pt reports she needs relief.   End of Session PT - End of Session Equipment Utilized During Treatment: Gait belt Activity Tolerance: Other (comment) (Limited by N/V) Patient left: in chair;with call bell/phone within reach;with family/visitor present Nurse Communication: Mobility status  GP     Jolyn Lent 04/14/2013, 3:57 PM  Jolyn Lent, PT, DPT Acute Rehabilitation Services Pager: [phone]

## 2013-04-14 NOTE — Transfer of Care (Signed)
Immediate Anesthesia Transfer of Care Note  Patient: Traci Roy  Procedure(s) Performed: Procedure(s): TOTAL HIP ARTHROPLASTY ANTERIOR APPROACH (Right)  Patient Location: PACU  Anesthesia Type:General  Level of Consciousness: awake  Airway & Oxygen Therapy: Patient Spontanous Breathing and Patient connected to face mask oxygen  Post-op Assessment: Report given to PACU RN and Post -op Vital signs reviewed and stable  Post vital signs: Reviewed and stable  Complications: No apparent anesthesia complications

## 2013-04-14 NOTE — Preoperative (Signed)
Beta Blockers   Reason not to administer Beta Blockers:Not Applicable 

## 2013-04-14 NOTE — Anesthesia Postprocedure Evaluation (Signed)
Anesthesia Post Note  Patient: Traci Roy  Procedure(s) Performed: Procedure(s) (LRB): TOTAL HIP ARTHROPLASTY ANTERIOR APPROACH (Right)  Anesthesia type: GA  Patient location: PACU  Post pain: Pain level controlled  Post assessment: Post-op Vital signs reviewed  Last Vitals:  Filed Vitals:   04/14/13 1020  BP: 135/60  Pulse: 88  Temp:   Resp: 10    Post vital signs: Reviewed  Level of consciousness: sedated  Complications: No apparent anesthesia complications

## 2013-04-14 NOTE — Anesthesia Preprocedure Evaluation (Addendum)
Anesthesia Evaluation  Patient identified by MRN, date of birth, ID band Patient awake    Reviewed: Allergy & Precautions, H&P , Patient's Chart, lab work & pertinent test results, reviewed documented beta blocker date and time   History of Anesthesia Complications Negative for: history of anesthetic complications  Airway Mallampati: II TM Distance: >3 FB Neck ROM: full    Dental  (+) Teeth Intact, Dental Advisory Given   Pulmonary  breath sounds clear to auscultation        Cardiovascular Exercise Tolerance: Good hypertension, Rhythm:regular Rate:Normal     Neuro/Psych    GI/Hepatic GERD-  Controlled,  Endo/Other  Morbid obesity  Renal/GU      Musculoskeletal   Abdominal   Peds  Hematology   Anesthesia Other Findings   Reproductive/Obstetrics                         Anesthesia Physical Anesthesia Plan  ASA: III  Anesthesia Plan: General ETT   Post-op Pain Management:    Induction: Intravenous  Airway Management Planned: Oral ETT  Additional Equipment:   Intra-op Plan:   Post-operative Plan: Extubation in OR  Informed Consent: I have reviewed the patients History and Physical, chart, labs and discussed the procedure including the risks, benefits and alternatives for the proposed anesthesia with the patient or authorized representative who has indicated his/her understanding and acceptance.   Dental Advisory Given and Dental advisory given  Plan Discussed with: CRNA and Surgeon  Anesthesia Plan Comments:        Anesthesia Quick Evaluation

## 2013-04-15 LAB — BASIC METABOLIC PANEL
BUN: 11 mg/dL (ref 6–23)
CHLORIDE: 101 meq/L (ref 96–112)
CO2: 26 mEq/L (ref 19–32)
Calcium: 8.7 mg/dL (ref 8.4–10.5)
Creatinine, Ser: 0.95 mg/dL (ref 0.50–1.10)
GFR calc Af Amer: 78 mL/min — ABNORMAL LOW (ref >90)
GFR calc non Af Amer: 67 mL/min — ABNORMAL LOW (ref >90)
Glucose, Bld: 123 mg/dL — ABNORMAL HIGH (ref 70–99)
Potassium: 3.6 mEq/L — ABNORMAL LOW (ref 3.7–5.3)
Sodium: 140 mEq/L (ref 137–147)

## 2013-04-15 LAB — CBC
HEMATOCRIT: 32.7 % — AB (ref 36.0–46.0)
HEMOGLOBIN: 10.7 g/dL — AB (ref 12.0–15.0)
MCH: 30.8 pg (ref 26.0–34.0)
MCHC: 32.7 g/dL (ref 30.0–36.0)
MCV: 94.2 fL (ref 78.0–100.0)
PLATELETS: 252 10*3/uL (ref 150–400)
RBC: 3.47 MIL/uL — AB (ref 3.87–5.11)
RDW: 13.7 % (ref 11.5–15.5)
WBC: 10.7 10*3/uL — AB (ref 4.0–10.5)

## 2013-04-15 NOTE — Progress Notes (Signed)
Physical Therapy Treatment Patient Details Name: YATZIL CLIPPINGER MRN: [id number] DOB: Oct 31, 1960 Today's Date: 04/15/2013 Time: 0931-1001 PT Time Calculation (min): 30 min  PT Assessment / Plan / Recommendation  History of Present Illness Pt is a 53 y/o female admitted s/p THA anterior approach.    PT Comments   Pt progressing towards physical therapy goals. Was able to tolerate ambulation and therapeutic exercise with no complaints of increased pain. Overall, pt states she feels better than yesterday and demonstrated a good rehab effort.   Follow Up Recommendations  Home health PT     Does the patient have the potential to tolerate intense rehabilitation     Barriers to Discharge        Equipment Recommendations  Rolling walker with 5" wheels;3in1 (PT)    Recommendations for Other Services    Frequency 7X/week   Progress towards PT Goals Progress towards PT goals: Progressing toward goals  Plan Current plan remains appropriate    Precautions / Restrictions Precautions Precautions: Fall Precaution Comments: Direct anterior hip Restrictions Weight Bearing Restrictions: Yes RLE Weight Bearing: Weight bearing as tolerated   Pertinent Vitals/Pain Pt received pain medication prior to PT session, and had little complaints of pain throughout session.     Mobility  Bed Mobility Overal bed mobility: Needs Assistance Bed Mobility: Supine to Sit Supine to sit: Min assist General bed mobility comments: VC's for sequencing and technique. Assist for movement and support of RLE while pt transitioned to EOB.  Transfers Overall transfer level: Needs assistance Equipment used: Rolling walker (2 wheeled) Transfers: Sit to/from Stand Sit to Stand: Min guard General transfer comment: VC's for hand placement on seated surface for safety. Pt able to achieve full standing without assist, however increased time needed.  Ambulation/Gait Ambulation/Gait assistance: Min guard Ambulation  Distance (Feet): 75 Feet Assistive device: Rolling walker (2 wheeled) Gait Pattern/deviations: Step-to pattern;Decreased stride length Gait velocity: Decreased Gait velocity interpretation: Below normal speed for age/gender General Gait Details: VC's for sequencing and safety awareness with the RW. Specific cues for increased heel strike and quad activation during weight bearing.     Exercises Total Joint Exercises Ankle Circles/Pumps: 10 reps Quad Sets: 10 reps Short Arc Quad: 10 reps Heel Slides: 10 reps Hip ABduction/ADduction: 10 reps   PT Diagnosis:    PT Problem List:   PT Treatment Interventions:     PT Goals (current goals can now be found in the care plan section) Acute Rehab PT Goals Patient Stated Goal: To walk farther PT Goal Formulation: With patient/family Time For Goal Achievement: 04/28/13 Potential to Achieve Goals: Good  Visit Information  Last PT Received On: 04/15/13 Assistance Needed: +1 History of Present Illness: Pt is a 53 y/o female admitted s/p THA anterior approach.     Subjective Data  Patient Stated Goal: To walk farther   Cognition  Cognition Arousal/Alertness: Awake/alert Behavior During Therapy: WFL for tasks assessed/performed Overall Cognitive Status: Within Functional Limits for tasks assessed    Balance  Balance Overall balance assessment: No apparent balance deficits (not formally assessed)  End of Session PT - End of Session Equipment Utilized During Treatment: Gait belt Activity Tolerance: Patient tolerated treatment well Patient left: in chair;with call bell/phone within reach Nurse Communication: Mobility status   GP     Jolyn Lent 04/15/2013, 10:09 AM  Jolyn Lent, PT, DPT Acute Rehabilitation Services Pager: [phone]

## 2013-04-15 NOTE — Evaluation (Signed)
Occupational Therapy Evaluation Patient Details Name: Traci Roy MRN: [id number] DOB: May 18, 1960 Today's Date: 04/15/2013 Time: 1012-1059 OT Time Calculation (min): 47 min  OT Assessment / Plan / Recommendation History of present illness Pt is a 53 y/o female admitted s/p THA anterior approach.    Clinical Impression   Pt presents with R hip pain, weakness, and impaired balance interfering with mobility and ADL,.  Began education in use of AE for LB ADL and toileting.  Will likely need tub equipment as well. Will follow.    OT Assessment  Patient needs continued OT Services    Follow Up Recommendations  Home health OT    Barriers to Discharge      Equipment Recommendations  3 in 1 bedside comode (tub equipment TBA)    Recommendations for Other Services    Frequency  Min 2X/week    Precautions / Restrictions Precautions Precautions: Fall Precaution Comments: Direct anterior hip Restrictions Weight Bearing Restrictions: Yes RLE Weight Bearing: Weight bearing as tolerated   Pertinent Vitals/Pain 4-5/10 R hip, premedicated, iced, repositioned, VSS.    ADL  Eating/Feeding: Independent Where Assessed - Eating/Feeding: Chair Grooming: Teeth care;Wash/dry hands;Wash/dry face;Set up Where Assessed - Grooming: Unsupported sitting Upper Body Bathing: Set up Where Assessed - Upper Body Bathing: Unsupported sitting Lower Body Bathing: Maximal assistance Where Assessed - Lower Body Bathing: Unsupported sitting;Supported sit to stand Upper Body Dressing: Set up Where Assessed - Upper Body Dressing: Unsupported sitting Lower Body Dressing: Maximal assistance Where Assessed - Lower Body Dressing: Unsupported sitting;Supported sit to stand Toilet Transfer: Minimal assistance Toilet Transfer Method: Sit to stand Toilet Transfer Equipment: Bedside commode Toileting - Clothing Manipulation and Hygiene: Maximal assistance Where Assessed - Best boy and  Hygiene: Sit to stand from 3-in-1 or toilet Equipment Used: Gait belt;Rolling walker;Reacher;Long-handled sponge;Long-handled shoe horn;Sock aid Transfers/Ambulation Related to ADLs: min assist with RW ADL Comments: Educated pt in use of AE for LE bathing and dressing and toilet aides, would like to practice more with sock aide.  Pt likely to need tub equipment, will demonstrate use of tub transfer bench.    OT Diagnosis: Generalized weakness;Acute pain  OT Problem List: Decreased strength;Decreased activity tolerance;Impaired balance (sitting and/or standing);Decreased safety awareness;Decreased knowledge of use of DME or AE;Obesity;Pain OT Treatment Interventions: Self-care/ADL training;DME and/or AE instruction;Patient/family education;Balance training   OT Goals(Current goals can be found in the care plan section) Acute Rehab OT Goals Patient Stated Goal: To walk farther OT Goal Formulation: With patient Time For Goal Achievement: 04/22/13 Potential to Achieve Goals: Good ADL Goals Pt Will Perform Grooming: with supervision;standing Pt Will Perform Lower Body Bathing: with supervision;with adaptive equipment;sit to/from stand Pt Will Perform Lower Body Dressing: with supervision;sit to/from stand;with adaptive equipment Pt Will Transfer to Toilet: with supervision;ambulating;bedside commode (over toilet) Pt Will Perform Toileting - Clothing Manipulation and hygiene: with supervision;sit to/from stand;with adaptive equipment Pt Will Perform Tub/Shower Transfer: Tub transfer;with min assist;tub bench;ambulating  Visit Information  Last OT Received On: 04/15/13 Assistance Needed: +1 History of Present Illness: Pt is a 53 y/o female admitted s/p THA anterior approach.        Prior [place] expects to be discharged to:: Private residence Living Arrangements: Spouse/significant other;Children Available Help at Discharge: Friend(s);Available 24  hours/day Type of Home: House Home Access: Stairs to enter [employer] of Steps: 4 Entrance Stairs-Rails: Right;Left Home Layout: One level Home Equipment: None Prior Function Level of Independence: Needs assistance ADL's / Homemaking  Assistance Needed: Putting socks and shoes on R foot Communication Communication: No difficulties Dominant Hand: Right         Vision/Perception Vision - History Patient Visual Report: No change from baseline   Cognition  Cognition Arousal/Alertness: Awake/alert Behavior During Therapy: WFL for tasks assessed/performed Overall Cognitive Status: Within Functional Limits for tasks assessed    Extremity/Trunk Assessment Upper Extremity Assessment Upper Extremity Assessment: Overall WFL for tasks assessed Lower Extremity Assessment Lower Extremity Assessment: Defer to PT evaluation Cervical / Trunk Assessment Cervical / Trunk Assessment: Normal     Mobility Bed Mobility Overal bed mobility:  (not assessed, pt in chair)  Transfers Overall transfer level: Needs assistance Equipment used: Rolling walker (2 wheeled) Transfers: Sit to/from Stand Sit to Stand: Min guard General transfer comment: VC's for hand placement on seated surface for safety. Pt able to achieve full standing without assist, however increased time needed.      Exercise    Balance Balance Overall balance assessment: Needs assistance Sitting-balance support: Feet supported Sitting balance-Leahy Scale: Good Standing balance support: During functional activity Standing balance-Leahy Scale: Fair   End of Session OT - End of Session Activity Tolerance: Patient limited by pain Patient left: in chair;with call bell/phone within reach Nurse Communication:  (IV leaking)  GO     Malka So 04/15/2013, 11:09 AM [phone]

## 2013-04-15 NOTE — Progress Notes (Signed)
Physical Therapy Treatment Patient Details Name: Traci Roy MRN: [id number] DOB: 04-02-1960 Today's Date: 04/15/2013 Time: 1235-1300 PT Time Calculation (min): 25 min  PT Assessment / Plan / Recommendation  History of Present Illness Pt is a 53 y/o female admitted s/p THA anterior approach.    PT Comments   Pt is making slow, but steady progress. Pt needs continued focus on endurance and strengthening. Pt reports she is having a difficult time lifting her leg during gait and is concerned about steps. Will attempt step training tomorrow. Recommend continued acute PT until d/c home with HHPT.  Follow Up Recommendations  Home health PT     Does the patient have the potential to tolerate intense rehabilitation     Barriers to Discharge        Equipment Recommendations  Rolling walker with 5" wheels;3in1 (PT)    Recommendations for Other Services    Frequency 7X/week   Progress towards PT Goals Progress towards PT goals: Progressing toward goals  Plan Current plan remains appropriate    Precautions / Restrictions Precautions Precautions: Fall Precaution Comments: Direct anterior hip Restrictions Weight Bearing Restrictions: Yes RLE Weight Bearing: Weight bearing as tolerated   Pertinent Vitals/Pain    Mobility  Bed Mobility Overal bed mobility: Needs Assistance Bed Mobility: Supine to Sit;Sit to Supine Supine to sit: Min assist Sit to supine: Min assist General bed mobility comments: VC's for sequencing and technique. Assist for movement and support of RLE while pt transitioned to EOB.  Transfers Overall transfer level: Needs assistance Equipment used: Rolling walker (2 wheeled) Transfers: Sit to/from Stand Sit to Stand: Min guard General transfer comment: VC's for hand placement on seated surface for safety. Ambulation/Gait Ambulation/Gait assistance: Supervision Ambulation Distance (Feet): 85 Feet Assistive device: Rolling walker (2 wheeled) Gait  Pattern/deviations: Step-to pattern;Decreased stride length Gait velocity: Decreased Gait velocity interpretation: Below normal speed for age/gender General Gait Details: multiple standing rest breaks due to arms being tired. Reinforced deceased WB on UE and more on LEs to reduce UE fatigue.     Exercises Total Joint Exercises Ankle Circles/Pumps: 10 reps Quad Sets: AROM;Strengthening;Both;10 reps;Supine Short Arc Quad: 10 reps Heel Slides: AROM;Right;10 reps;Supine Hip ABduction/ADduction: AAROM;Strengthening;Right;10 reps Long Arc Quad: AROM;Strengthening;Right;10 reps;Seated   PT Diagnosis:    PT Problem List:   PT Treatment Interventions:     PT Goals (current goals can now be found in the care plan section) Acute Rehab PT Goals Patient Stated Goal: To walk farther PT Goal Formulation: With patient/family Time For Goal Achievement: 04/28/13 Potential to Achieve Goals: Good  Visit Information  Last PT Received On: 04/15/13 Assistance Needed: +1 History of Present Illness: Pt is a 53 y/o female admitted s/p THA anterior approach.     Subjective Data  Patient Stated Goal: To walk farther   Cognition  Cognition Arousal/Alertness: Awake/alert Behavior During Therapy: WFL for tasks assessed/performed Overall Cognitive Status: Within Functional Limits for tasks assessed    Balance  Balance Overall balance assessment: Needs assistance Sitting-balance support: Feet supported Sitting balance-Leahy Scale: Good Standing balance support: During functional activity Standing balance-Leahy Scale: Fair  End of Session PT - End of Session Equipment Utilized During Treatment: Gait belt Activity Tolerance: Patient tolerated treatment well Patient left: in bed;with call bell/phone within reach Nurse Communication: Mobility status   GP     Lelon Mast 04/15/2013, 1:02 PM

## 2013-04-15 NOTE — Plan of Care (Signed)
Problem: Consults Goal: Diagnosis- Total Joint Replacement Primary Total Hip Right     

## 2013-04-15 NOTE — Progress Notes (Signed)
Subjective: 1 Day Post-Op Procedure(s) (LRB): TOTAL HIP ARTHROPLASTY ANTERIOR APPROACH (Right)  Activity level:  wbat Diet tolerance:  Eating well Voiding:  ok Patient reports pain as 3 on 0-10 scale.    Objective: Vital signs in last 24 hours: Temp:  [98.2 F (36.8 C)-99.1 F (37.3 C)] 98.2 F (36.8 C) (03/11 0738) Pulse Rate:  [57-115] 76 (03/11 0738) Resp:  [10-18] 18 (03/11 0738) BP: (98-136)/(47-77) 107/50 mmHg (03/11 0738) SpO2:  [95 %-100 %] 97 % (03/11 0738)  Labs:  Recent Labs  04/15/13 0602  HGB 10.7*    Recent Labs  04/15/13 0602  WBC 10.7*  RBC 3.47*  HCT 32.7*  PLT 252    Recent Labs  04/15/13 0602  NA 140  K 3.6*  CL 101  CO2 26  BUN 11  CREATININE 0.95  GLUCOSE 123*  CALCIUM 8.7   No results found for this basename: LABPT, INR,  in the last 72 hours  Physical Exam:  Neurologically intact ABD soft Neurovascular intact Sensation intact distally Dorsiflexion/Plantar flexion intact Incision: dressing C/D/I No cellulitis present  Assessment/Plan:  1 Day Post-Op Procedure(s) (LRB): TOTAL HIP ARTHROPLASTY ANTERIOR APPROACH (Right) Advance diet Up with therapy D/C IV fluids Plan for discharge tomorrow Discharge home with home health Cont asa 325mg  BID x 4weeks Dressing change     Deania Siguenza PAUL 04/15/2013, 8:12 AM

## 2013-04-16 ENCOUNTER — Encounter (HOSPITAL_COMMUNITY): Payer: Self-pay | Admitting: Orthopaedic Surgery

## 2013-04-16 LAB — CBC
HCT: 30.3 % — ABNORMAL LOW (ref 36.0–46.0)
Hemoglobin: 10.1 g/dL — ABNORMAL LOW (ref 12.0–15.0)
MCH: 31.4 pg (ref 26.0–34.0)
MCHC: 33.3 g/dL (ref 30.0–36.0)
MCV: 94.1 fL (ref 78.0–100.0)
Platelets: 204 10*3/uL (ref 150–400)
RBC: 3.22 MIL/uL — ABNORMAL LOW (ref 3.87–5.11)
RDW: 13.6 % (ref 11.5–15.5)
WBC: 10.1 10*3/uL (ref 4.0–10.5)

## 2013-04-16 MED ORDER — ASPIRIN 325 MG PO TBEC: 325.0000 mg | DELAYED_RELEASE_TABLET | Freq: Two times a day (BID) | ORAL | Status: DC

## 2013-04-16 MED ORDER — HYDROCODONE-ACETAMINOPHEN 5-325 MG PO TABS: 1.0000 | ORAL_TABLET | Freq: Four times a day (QID) | ORAL | Status: DC | PRN

## 2013-04-16 MED ORDER — METHOCARBAMOL 500 MG PO TABS: 500.0000 mg | ORAL_TABLET | Freq: Four times a day (QID) | ORAL | Status: DC | PRN

## 2013-04-16 NOTE — Progress Notes (Signed)
Physical Therapy Treatment Patient Details Name: Traci Roy MRN: [id number] DOB: 1960-03-29 Today's Date: 04/16/2013 Time: 0938-1829 PT Time Calculation (min): 34 min  PT Assessment / Plan / Recommendation  History of Present Illness Pt is a 53 y/o female admitted s/p THA anterior approach.    PT Comments   Pt progressing towards physical therapy goals. Overall mobility is improving, however pt still limited by decreased tolerance for functional activity. Specific complaints include UE fatigue with using the RW. Encouraging pt to increase weight bearing on surgical LE and therefore off weight her UE's on the walker.   Follow Up Recommendations  Home health PT     Does the patient have the potential to tolerate intense rehabilitation     Barriers to Discharge        Equipment Recommendations  Rolling walker with 5" wheels;3in1 (PT)    Recommendations for Other Services    Frequency 7X/week   Progress towards PT Goals Progress towards PT goals: Progressing toward goals  Plan Current plan remains appropriate    Precautions / Restrictions Precautions Precautions: Fall Restrictions Weight Bearing Restrictions: Yes RLE Weight Bearing: Weight bearing as tolerated   Pertinent Vitals/Pain Pt reports minimal pain throughout session.     Mobility  Bed Mobility Overal bed mobility: Needs Assistance Bed Mobility: Supine to Sit Supine to sit: Supervision General bed mobility comments: Pt able to transition to EOB without physical assist. Increased time needed; HOB flat and use of bed rails permitted. Transfers Overall transfer level: Needs assistance Equipment used: Rolling walker (2 wheeled) Transfers: Sit to/from Stand Sit to Stand: Min guard General transfer comment: VC's for hand placement on seated surface for safety.  Ambulation/Gait Ambulation/Gait assistance: Min guard Ambulation Distance (Feet): 90 Feet Assistive device: Rolling walker (2 wheeled) Gait  Pattern/deviations: Step-to pattern;Decreased stride length Gait velocity: Decreased Gait velocity interpretation: Below normal speed for age/gender General Gait Details: Pt required >5 standing rest breaks and 1 seated rest break during gait training. Pt ambulated 45 feet first bout, and 45 feet second bout.  Stairs: Yes Stairs assistance: Min guard Stair Management: Two rails;Forwards Number of Stairs: 5 General stair comments: VC's for sequencing and safety awareness.    Exercises Total Joint Exercises Ankle Circles/Pumps: 10 reps Quad Sets: 10 reps Heel Slides: 10 reps Hip ABduction/ADduction: 10 reps Long Arc Quad: 10 reps   PT Diagnosis:    PT Problem List:   PT Treatment Interventions:     PT Goals (current goals can now be found in the care plan section) Acute Rehab PT Goals Patient Stated Goal: To walk farther PT Goal Formulation: With patient/family Time For Goal Achievement: 04/28/13 Potential to Achieve Goals: Good  Visit Information  Last PT Received On: 04/16/13 Assistance Needed: +1 History of Present Illness: Pt is a 53 y/o female admitted s/p THA anterior approach.     Subjective Data  Subjective: "Am I doing alright? Do you think I'm ok to go home?" Patient Stated Goal: To walk farther   Cognition  Cognition Arousal/Alertness: Awake/alert Behavior During Therapy: WFL for tasks assessed/performed Overall Cognitive Status: Within Functional Limits for tasks assessed    Balance  Balance Overall balance assessment: Needs assistance Sitting-balance support: Feet supported Sitting balance-Leahy Scale: Good Standing balance support: Bilateral upper extremity supported Standing balance-Leahy Scale: Fair  End of Session PT - End of Session Equipment Utilized During Treatment: Gait belt Activity Tolerance: Patient tolerated treatment well Patient left: in bed;with call bell/phone within reach Nurse Communication: Mobility status  GP     Jolyn Lent 04/16/2013, 1:49 PM  Jolyn Lent, PT, DPT Acute Rehabilitation Services Pager: [phone]

## 2013-04-16 NOTE — Discharge Summary (Signed)
Patient ID: Traci Roy MRN: [id number] DOB/AGE: [birth year] 53 y.o.  Admit date: 04/14/2013 Discharge date: 04/16/2013  Admission Diagnoses:  Active Problems:   Degenerative joint disease (DJD) of hip   Discharge Diagnoses:  Same  Past Medical History  Diagnosis Date  . Hypertension     takes Losartan and HCTZ daily  . History of bronchitis     last time many yrs ago  . Flu     end of Dec 2014  . Dizziness     occasionally and no meds  . Numbness     right leg  . Arthritis   . Joint pain   . GERD (gastroesophageal reflux disease)     occasionally and no meds required  . History of duodenal ulcer   . Depression     takes Lexapro daily  . History of shingles     Surgeries: Procedure(s): TOTAL HIP ARTHROPLASTY ANTERIOR APPROACH on 04/14/2013   Consultants:    Discharged Condition: Improved  Hospital Course: Traci Roy is an 53 y.o. female who was admitted 04/14/2013 for operative treatment of<principal problem not specified>. Patient has severe unremitting pain that affects sleep, daily activities, and work/hobbies. After pre-op clearance the patient was taken to the operating room on 04/14/2013 and underwent  Procedure(s): TOTAL HIP ARTHROPLASTY ANTERIOR APPROACH.    Patient was given perioperative antibiotics: Anti-infectives   Start     Dose/Rate Route Frequency Ordered Stop   04/14/13 1400  ceFAZolin (ANCEF) IVPB 2 g/50 mL premix     2 g 100 mL/hr over 30 Minutes Intravenous Every 6 hours 04/14/13 1207 04/14/13 2247   04/14/13 0600  ceFAZolin (ANCEF) IVPB 2 g/50 mL premix     2 g 100 mL/hr over 30 Minutes Intravenous On call to O.R. 04/13/13 1432 04/14/13 7253       Patient was given sequential compression devices, early ambulation, and chemoprophylaxis to prevent DVT.  Patient benefited maximally from hospital stay and there were no complications.    Recent vital signs: Patient Vitals for the past 24 hrs:  BP Temp Temp src Pulse Resp SpO2   04/16/13 0601 130/58 mmHg 98.7 F (37.1 C) Oral 90 18 98 %  04/16/13 0400 - - - - 18 96 %  04/16/13 0000 - - - - 18 97 %  04/15/13 2000 - - - - 18 99 %  04/15/13 1948 127/62 mmHg 98.4 F (36.9 C) Oral 89 18 99 %  04/15/13 1300 103/44 mmHg 98.1 F (36.7 C) Oral 84 18 100 %  04/15/13 1200 - - - - 18 92 %     Recent laboratory studies:  Recent Labs  04/15/13 0602 04/16/13 0600  WBC 10.7* 10.1  HGB 10.7* 10.1*  HCT 32.7* 30.3*  PLT 252 204  NA 140  --   K 3.6*  --   CL 101  --   CO2 26  --   BUN 11  --   CREATININE 0.95  --   GLUCOSE 123*  --   CALCIUM 8.7  --      Discharge Medications:     Medication List         acetaminophen 500 MG tablet  Commonly known as:  TYLENOL  Take 1,000 mg by mouth at bedtime as needed for mild pain.     aspirin 325 MG EC tablet  Take 1 tablet (325 mg total) by mouth 2 (two) times daily after a meal.     escitalopram 10 MG tablet  Commonly known as:  LEXAPRO  Take 10 mg by mouth daily.     hydrochlorothiazide 25 MG tablet  Commonly known as:  HYDRODIURIL  Take 25 mg by mouth daily.     HYDROcodone-acetaminophen 5-325 MG per tablet  Commonly known as:  NORCO/VICODIN  Take 1-2 tablets by mouth every 6 (six) hours as needed for moderate pain (breakthrough pain).     losartan 50 MG tablet  Commonly known as:  COZAAR  Take 50 mg by mouth daily.     methocarbamol 500 MG tablet  Commonly known as:  ROBAXIN  Take 1 tablet (500 mg total) by mouth every 6 (six) hours as needed for muscle spasms.        Diagnostic Studies: Dg Chest 2 View  04/09/2013   CLINICAL DATA:  PREOP  EXAM: CHEST  2 VIEW  COMPARISON:  CT ANGIO CHEST W/CM &/OR WO/CM dated 09/19/2010  FINDINGS: The heart size and mediastinal contours are within normal limits. Both lungs are clear. The visualized skeletal structures are unremarkable.  IMPRESSION: No active cardiopulmonary disease.   Electronically Signed   By: Margaree Mackintosh M.D.   On: 04/09/2013 14:27   Dg Hip  Operative Right  04/14/2013   CLINICAL DATA Post right-sided total hip replacement  EXAM DG OPERATIVE RIGHT HIP  FLUOROSCOPY TIME 25 seconds  COMPARISON DG FEMUR*R* dated 09/16/2009  FINDINGS Two spot intraoperative fluoroscopic images of the right hip and lower pelvis are provided for review. Images demonstrate the sequela of right total hip replacement. Alignment appears near anatomic on this solitary anterior projection radiograph. No definite fracture. There is expected subcutaneous emphysema about the operative site. No definite radiopaque foreign body.  IMPRESSION Post right total hip replacement without evidence of complication.  SIGNATURE  Electronically Signed   By: Sandi Mariscal M.D.   On: 04/14/2013 14:32    Disposition: Final discharge disposition not confirmed      Discharge Orders   Future Orders Complete By Expires   Call MD / Call 911  As directed    Comments:     If you experience chest pain or shortness of breath, CALL 911 and be transported to the hospital emergency room.  If you develope a fever above 101 F, pus (white drainage) or increased drainage or redness at the wound, or calf pain, call your surgeon's office.   Constipation Prevention  As directed    Comments:     Drink plenty of fluids.  Prune juice may be helpful.  You may use a stool softener, such as Colace (over the counter) 100 mg twice a day.  Use MiraLax (over the counter) for constipation as needed.   Diet - low sodium heart healthy  As directed    Discharge instructions  As directed    Comments:     Ice, elevation Reinforce dressing as needed ASA 325mg  BID x 4 weeks Office follow up 2 weeks   Increase activity slowly as tolerated  As directed       Follow-up Information   Follow up with Hessie Dibble, MD. Call in 2 weeks.   Specialty:  Orthopedic Surgery   Contact information:   [place] [address] [phone]        Signed: Rich Fuchs 04/16/2013, 11:11 AM

## 2013-04-16 NOTE — Progress Notes (Signed)
Subjective: 2 Days Post-Op Procedure(s) (LRB): TOTAL HIP ARTHROPLASTY ANTERIOR APPROACH (Right)  Activity level:  wbat Diet tolerance:  ok Voiding:  ok Patient reports pain as 2 on 0-10 scale.    Objective: Vital signs in last 24 hours: Temp:  [98.1 F (36.7 C)-98.7 F (37.1 C)] 98.7 F (37.1 C) (03/12 0601) Pulse Rate:  [84-90] 90 (03/12 0601) Resp:  [18] 18 (03/12 0601) BP: (103-130)/(44-62) 130/58 mmHg (03/12 0601) SpO2:  [92 %-100 %] 98 % (03/12 0601)  Labs:  Recent Labs  04/15/13 0602 04/16/13 0600  HGB 10.7* 10.1*    Recent Labs  04/15/13 0602 04/16/13 0600  WBC 10.7* 10.1  RBC 3.47* 3.22*  HCT 32.7* 30.3*  PLT 252 204    Recent Labs  04/15/13 0602  NA 140  K 3.6*  CL 101  CO2 26  BUN 11  CREATININE 0.95  GLUCOSE 123*  CALCIUM 8.7   No results found for this basename: LABPT, INR,  in the last 72 hours  Physical Exam:  Neurologically intact ABD soft Neurovascular intact Sensation intact distally Dorsiflexion/Plantar flexion intact Incision: dressing C/D/I No cellulitis present  Assessment/Plan:  2 Days Post-Op Procedure(s) (LRB): TOTAL HIP ARTHROPLASTY ANTERIOR APPROACH (Right) Advance diet Up with therapy Discharge home with home health Plan on discharge today after PT visit this afternoon Continue ASA 325mg  BID x 4 weeks    Roizy Harold, Larwance Sachs 04/16/2013, 11:04 AM

## 2013-04-16 NOTE — Progress Notes (Signed)
Physical Therapy Treatment Patient Details Name: Traci Roy MRN: [id number] DOB: [birth year] Today's Date: 04/16/2013 Time: 0175-1025 PT Time Calculation (min): 14 min  PT Assessment / Plan / Recommendation  History of Present Illness Pt is a 53 y/o female admitted s/p THA anterior approach.    PT Comments   Pt anticipates d/c home this afternoon, and states she will be ambulating around room to prepare to leave. Because of this, pt declining any OOB activity at this time. Therapist issued HEP handout and discussed car transfer and safety awareness upon returning home.   Follow Up Recommendations  Home health PT     Does the patient have the potential to tolerate intense rehabilitation     Barriers to Discharge        Equipment Recommendations  Rolling walker with 5" wheels;3in1 (PT)    Recommendations for Other Services    Frequency 7X/week   Progress towards PT Goals Progress towards PT goals: Progressing toward goals  Plan Current plan remains appropriate    Precautions / Restrictions Precautions Precautions: Fall Precaution Comments: Direct anterior hip Restrictions Weight Bearing Restrictions: Yes RLE Weight Bearing: Weight bearing as tolerated   Pertinent Vitals/Pain Pt does not complain of pain throughout session.     Mobility  Bed Mobility Overal bed mobility: Needs Assistance Bed Mobility: Supine to Sit Supine to sit: Supervision General bed mobility comments: NT - pt declined any OOB. Transfers Overall transfer level: Needs assistance Equipment used: Rolling walker (2 wheeled) Transfers: Sit to/from Stand Sit to Stand: Min guard General transfer comment: NT - pt declined any OOB. Ambulation/Gait Ambulation/Gait assistance: Min guard Ambulation Distance (Feet): 90 Feet Assistive device: Rolling walker (2 wheeled) Gait Pattern/deviations: Step-to pattern;Decreased stride length Gait velocity: Decreased Gait velocity interpretation: Below normal speed  for age/gender General Gait Details: Pt required >5 standing rest breaks and 1 seated rest break during gait training. Pt ambulated 45 feet first bout, and 45 feet second bout.  Stairs: Yes Stairs assistance: Min guard Stair Management: Two rails;Forwards Number of Stairs: 5 General stair comments: VC's for sequencing and safety awareness.    Exercises Total Joint Exercises Ankle Circles/Pumps: 10 reps Quad Sets: 10 reps Towel Squeeze: 10 reps Short Arc Quad: 10 reps Heel Slides: 10 reps Hip ABduction/ADduction: 10 reps Long Arc Quad: 10 reps   PT Diagnosis:    PT Problem List:   PT Treatment Interventions:     PT Goals (current goals can now be found in the care plan section) Acute Rehab PT Goals Patient Stated Goal: To return home this afternoon PT Goal Formulation: With patient/family Time For Goal Achievement: 04/28/13 Potential to Achieve Goals: Good  Visit Information  Last PT Received On: 04/16/13 Assistance Needed: +1 History of Present Illness: Pt is a 53 y/o female admitted s/p THA anterior approach.     Subjective Data  Subjective: "I think I'm ok. I'll be walking around to get ready to leave, I don't need to do any more before I go." Patient Stated Goal: To return home this afternoon   Cognition  Cognition Arousal/Alertness: Awake/alert Behavior During Therapy: WFL for tasks assessed/performed Overall Cognitive Status: Within Functional Limits for tasks assessed    Balance  Balance Overall balance assessment: Needs assistance Sitting-balance support: Feet supported Sitting balance-Leahy Scale: Good Standing balance support: Bilateral upper extremity supported Standing balance-Leahy Scale: Fair General Comments General comments (skin integrity, edema, etc.): Issued HEP and discussed reps/sets. Pt practiced a few reps of each.   End of Session  PT - End of Session Equipment Utilized During Treatment: Gait belt Activity Tolerance: Patient tolerated  treatment well Patient left: in bed;with call bell/phone within reach Nurse Communication: Mobility status   GP     Jolyn Lent 04/16/2013, 4:17 PM  Jolyn Lent, PT, DPT Acute Rehabilitation Services Pager: [phone]

## 2013-04-16 NOTE — Progress Notes (Signed)
OT Cancellation Note  Patient Details Name: Traci Roy MRN: [id number] DOB: [birth year]   Cancelled Treatment:    Reason Eval/Treat Not Completed: Other (comment) Pt declined OT, states no further concerns for ADLs and has 24/7 assistance at home. Acute OT to sign off.   Juluis Rainier [phone] 04/16/2013, 1:17 PM

## 2013-04-16 NOTE — Care Management Note (Signed)
CARE MANAGEMENT NOTE 04/16/2013  Patient:  LOYALTY, ARENTZ   Account Number:  [id number]  Date Initiated:  04/16/2013  Documentation initiated by:  Ricki Miller  Subjective/Objective Assessment:   53 yr old female s/p right total hip arthroplasty.     Action/Plan:   Case Manager spoke to patient and husband concerning home health and DME needs at discharge. Choice offered. Referral called to [facility], [place]. patient has family support at discharge.   Anticipated DC Date:  04/17/2013   Anticipated DC Plan:  [place]  CM consult      [facility] Choice  HOME HEALTH  DURABLE MEDICAL EQUIPMENT   Choice offered to / List presented to:  C-1 Patient   DME arranged  3-N-1  [facility]      DME agency  [employer]     HH arranged  HH-2 PT      [place].   Status of service:  Completed, signed off Medicare Important Message given?   (If response is "NO", the following Medicare IM given date fields will be blank) Date Medicare IM given:   Date Additional Medicare IM given:    Discharge Disposition:  [place]

## 2013-05-24 ENCOUNTER — Encounter (HOSPITAL_COMMUNITY): Payer: Self-pay | Admitting: Emergency Medicine

## 2013-05-24 ENCOUNTER — Emergency Department (HOSPITAL_COMMUNITY): Payer: BC Managed Care – PPO

## 2013-05-24 ENCOUNTER — Emergency Department (HOSPITAL_COMMUNITY)
Admission: EM | Admit: 2013-05-24 | Discharge: 2013-05-24 | Disposition: A | Discharge status: Home / Self Care | Payer: BC Managed Care – PPO | Attending: Emergency Medicine | Admitting: Emergency Medicine

## 2013-05-24 DIAGNOSIS — Z7982 Long term (current) use of aspirin: Secondary | ICD-10-CM | POA: Insufficient documentation

## 2013-05-24 DIAGNOSIS — Z8619 Personal history of other infectious and parasitic diseases: Secondary | ICD-10-CM | POA: Insufficient documentation

## 2013-05-24 DIAGNOSIS — F329 Major depressive disorder, single episode, unspecified: Secondary | ICD-10-CM | POA: Insufficient documentation

## 2013-05-24 DIAGNOSIS — Z79899 Other long term (current) drug therapy: Secondary | ICD-10-CM | POA: Insufficient documentation

## 2013-05-24 DIAGNOSIS — K5732 Diverticulitis of large intestine without perforation or abscess without bleeding: Secondary | ICD-10-CM | POA: Insufficient documentation

## 2013-05-24 DIAGNOSIS — F3289 Other specified depressive episodes: Secondary | ICD-10-CM | POA: Insufficient documentation

## 2013-05-24 DIAGNOSIS — Z8709 Personal history of other diseases of the respiratory system: Secondary | ICD-10-CM | POA: Insufficient documentation

## 2013-05-24 DIAGNOSIS — M129 Arthropathy, unspecified: Secondary | ICD-10-CM | POA: Insufficient documentation

## 2013-05-24 DIAGNOSIS — I10 Essential (primary) hypertension: Secondary | ICD-10-CM | POA: Insufficient documentation

## 2013-05-24 DIAGNOSIS — R300 Dysuria: Secondary | ICD-10-CM | POA: Insufficient documentation

## 2013-05-24 DIAGNOSIS — K5792 Diverticulitis of intestine, part unspecified, without perforation or abscess without bleeding: Secondary | ICD-10-CM

## 2013-05-24 LAB — CBC WITH DIFFERENTIAL/PLATELET
BASOS PCT: 0 % (ref 0–1)
Basophils Absolute: 0 10*3/uL (ref 0.0–0.1)
EOS ABS: 0.1 10*3/uL (ref 0.0–0.7)
Eosinophils Relative: 1 % (ref 0–5)
HCT: 38.9 % (ref 36.0–46.0)
HEMOGLOBIN: 12.8 g/dL (ref 12.0–15.0)
Lymphocytes Relative: 16 % (ref 12–46)
Lymphs Abs: 2 10*3/uL (ref 0.7–4.0)
MCH: 30.1 pg (ref 26.0–34.0)
MCHC: 32.9 g/dL (ref 30.0–36.0)
MCV: 91.5 fL (ref 78.0–100.0)
MONO ABS: 0.8 10*3/uL (ref 0.1–1.0)
MONOS PCT: 7 % (ref 3–12)
NEUTROS PCT: 76 % (ref 43–77)
Neutro Abs: 9 10*3/uL — ABNORMAL HIGH (ref 1.7–7.7)
Platelets: 298 10*3/uL (ref 150–400)
RBC: 4.25 MIL/uL (ref 3.87–5.11)
RDW: 13.2 % (ref 11.5–15.5)
WBC: 12 10*3/uL — ABNORMAL HIGH (ref 4.0–10.5)

## 2013-05-24 LAB — URINALYSIS, ROUTINE W REFLEX MICROSCOPIC
Bilirubin Urine: NEGATIVE
Glucose, UA: NEGATIVE mg/dL
Hgb urine dipstick: NEGATIVE
Ketones, ur: NEGATIVE mg/dL
LEUKOCYTES UA: NEGATIVE
NITRITE: NEGATIVE
PH: 7 (ref 5.0–8.0)
Protein, ur: NEGATIVE mg/dL
Specific Gravity, Urine: 1.017 (ref 1.005–1.030)
Urobilinogen, UA: 0.2 mg/dL (ref 0.0–1.0)

## 2013-05-24 LAB — COMPREHENSIVE METABOLIC PANEL
ALBUMIN: 3.5 g/dL (ref 3.5–5.2)
ALT: 13 U/L (ref 0–35)
AST: 14 U/L (ref 0–37)
Alkaline Phosphatase: 101 U/L (ref 39–117)
BUN: 14 mg/dL (ref 6–23)
CO2: 26 mEq/L (ref 19–32)
CREATININE: 0.85 mg/dL (ref 0.50–1.10)
Calcium: 9.3 mg/dL (ref 8.4–10.5)
Chloride: 101 mEq/L (ref 96–112)
GFR calc Af Amer: 89 mL/min — ABNORMAL LOW (ref >90)
GFR calc non Af Amer: 77 mL/min — ABNORMAL LOW (ref >90)
Glucose, Bld: 110 mg/dL — ABNORMAL HIGH (ref 70–99)
POTASSIUM: 3.6 meq/L — AB (ref 3.7–5.3)
Sodium: 139 mEq/L (ref 137–147)
TOTAL PROTEIN: 7.1 g/dL (ref 6.0–8.3)
Total Bilirubin: 0.3 mg/dL (ref 0.3–1.2)

## 2013-05-24 LAB — LIPASE, BLOOD: Lipase: 24 U/L (ref 11–59)

## 2013-05-24 MED ORDER — CIPROFLOXACIN HCL 500 MG PO TABS: 500.0000 mg | ORAL_TABLET | Freq: Two times a day (BID) | ORAL | Status: DC

## 2013-05-24 MED ORDER — METRONIDAZOLE 500 MG PO TABS: 500.0000 mg | ORAL_TABLET | Freq: Three times a day (TID) | ORAL | Status: DC

## 2013-05-24 MED ORDER — ONDANSETRON HCL 4 MG/2ML IJ SOLN
4.0000 mg | Freq: Once | INTRAMUSCULAR | Status: DC
  Filled 2013-05-24: qty 2

## 2013-05-24 MED ORDER — IOHEXOL 300 MG/ML  SOLN
100.0000 mL | Freq: Once | INTRAMUSCULAR | Status: AC | PRN
  Administered 2013-05-24: 100 mL via INTRAVENOUS

## 2013-05-24 MED ORDER — HYDROCODONE-ACETAMINOPHEN 5-325 MG PO TABS: 2.0000 | ORAL_TABLET | ORAL | Status: DC | PRN

## 2013-05-24 MED ORDER — SODIUM CHLORIDE 0.9 % IV BOLUS (SEPSIS)
1000.0000 mL | Freq: Once | INTRAVENOUS | Status: AC
  Administered 2013-05-24: 1000 mL via INTRAVENOUS

## 2013-05-24 MED ORDER — IOHEXOL 300 MG/ML  SOLN
25.0000 mL | INTRAMUSCULAR | Status: DC | PRN
  Administered 2013-05-24: 25 mL via ORAL

## 2013-05-24 MED ORDER — MORPHINE SULFATE 4 MG/ML IJ SOLN
6.0000 mg | Freq: Once | INTRAMUSCULAR | Status: DC
  Filled 2013-05-24: qty 2

## 2013-05-24 NOTE — ED Notes (Signed)
Pt presents to department for evaluation of lower abdominal pain radiating to back, also states dysuria. Onset Saturday morning. 5/10 pain at the time. Describes as sharp and stabbing in nature. Pt is alert and oriented x4.

## 2013-05-24 NOTE — ED Notes (Signed)
MD at bedside. Pt informed that she needs bowel rest and should not eat at this time.

## 2013-05-24 NOTE — ED Provider Notes (Signed)
CSN: [id number]     Arrival date & time 05/24/13  1344 History   First MD Initiated Contact with Patient 05/24/13 1356     Chief Complaint  Patient presents with  . Abdominal Pain  . Dysuria     (Consider location/radiation/quality/duration/timing/severity/associated sxs/prior Treatment) HPI Patient presents to the emergency department with lower abdominal pain, that started yesterday morning.  The patient, states, that the pain has gradually gotten worse over that time frame and is located mainly in the right lower quadrant, states, that she's having some lower back pain.  The patient, states, that she's not had any vomiting, diarrhea, and shortness of breath, weakness, dizziness, headache, blurred vision, fever rectal bleeding, hematuria, vaginal bleeding, vaginal discharge, or syncope.  Patient, states, that she had a right hip replacement done 5 weeks ago and has had no complications or problems from that. Past Medical History  Diagnosis Date  . Hypertension     takes Losartan and HCTZ daily  . History of bronchitis     last time many yrs ago  . Flu     end of Dec 2014  . Dizziness     occasionally and no meds  . Numbness     right leg  . Arthritis   . Joint pain   . GERD (gastroesophageal reflux disease)     occasionally and no meds required  . History of duodenal ulcer   . Depression     takes Lexapro daily  . History of shingles    Past Surgical History  Procedure Laterality Date  . Enlarged bladder as a child    . Breast enhancement surgery    . Tonsillectomy    . Cesarean section    . Colonoscopy    . Esophagogastroduodenoscopy    . Total hip arthroplasty Right 04/14/2013    Procedure: TOTAL HIP ARTHROPLASTY ANTERIOR APPROACH;  Surgeon: Hessie Dibble, MD;  Location: [facility];  Service: Orthopedics;  Laterality: Right;  . Total hip arthroplasty     No family history on file. History  Substance Use Topics  . Smoking status: Never Smoker   . Smokeless tobacco:  Not on file  . Alcohol Use: Yes     Comment: glass of wine every now and then   OB History   Grav Para Term Preterm Abortions TAB SAB Ect Mult Living                 Review of Systems All other systems negative except as documented in the HPI. All pertinent positives and negatives as reviewed in the HPI.   Allergies  Adhesive  Home Medications   Prior to Admission medications   Medication Sig Start Date End Date Taking? Authorizing Provider  acetaminophen (TYLENOL) 500 MG tablet Take 1,000 mg by mouth at bedtime as needed for mild pain.   Yes Historical Provider, MD  aspirin 325 MG tablet Take 325 mg by mouth daily.   Yes Historical Provider, MD  escitalopram (LEXAPRO) 10 MG tablet Take 10 mg by mouth daily.   Yes Historical Provider, MD  hydrochlorothiazide (HYDRODIURIL) 25 MG tablet Take 25 mg by mouth daily.   Yes Historical Provider, MD  losartan (COZAAR) 50 MG tablet Take 50 mg by mouth daily.   Yes Historical Provider, MD   BP 155/76  Pulse 94  Temp(Src) 98.3 F (36.8 C) (Oral)  Resp 19  SpO2 100% Physical Exam  Nursing note and vitals reviewed. Constitutional: She is oriented to person, place, and time.  She appears well-developed and well-nourished.  HENT:  Head: Normocephalic and atraumatic.  Mouth/Throat: Oropharynx is clear and moist.  Eyes: Pupils are equal, round, and reactive to light.  Neck: Normal range of motion. Neck supple.  Cardiovascular: Normal rate, regular rhythm and normal heart sounds.  Exam reveals no gallop and no friction rub.   No murmur heard. Pulmonary/Chest: Effort normal and breath sounds normal. No respiratory distress.  Abdominal: Soft. Normal appearance and bowel sounds are normal. She exhibits no distension. There is tenderness. There is guarding. There is no rigidity and no rebound.    Neurological: She is alert and oriented to person, place, and time.  Skin: Skin is warm and dry.    ED Course  Procedures (including critical  care time) Labs Review Labs Reviewed  CBC WITH DIFFERENTIAL - Abnormal; Notable for the following:    WBC 12.0 (*)    Neutro Abs 9.0 (*)    All other components within normal limits  COMPREHENSIVE METABOLIC PANEL - Abnormal; Notable for the following:    Potassium 3.6 (*)    Glucose, Bld 110 (*)    GFR calc non Af Amer 77 (*)    GFR calc Af Amer 89 (*)    All other components within normal limits  LIPASE, BLOOD  URINALYSIS, ROUTINE W REFLEX MICROSCOPIC    Patient will be monitored here with IV fluids and given pain medication, also, we'll obtain a CT scan to further delineate the patient's lower abdominal pain.  Patient has otherwise been stable here in the emergency department.  All questions were answered.  Patient is given an overview of the plan. Awaiting CT scan results. Margarita Mail PA-C will follow up with the patient's testing.  The patient has been seen by the attending Physician.    Brent General, PA-C 05/28/13 [phone]

## 2013-05-24 NOTE — ED Provider Notes (Signed)
Patient here for progressively worsening abdominal pain. + Leukocytosis.   Awaiting CT. If negative NON ob US/ R/O torsion   6:21 PM BP 126/63  Pulse 92  Temp(Src) 98.3 F (36.8 C) (Oral)  Resp 19  SpO2 100% Patient with CT + for diverticulitis.  Pain is well controlled at this time. Discussed CT findings, lab results with patient. D/c with cipro/flagyll Patient is nontoxic, nonseptic appearing, in no apparent distress.  Patient's pain and other symptoms adequately managed in emergency department.  Fluid bolus given.  Labs, imaging and vitals reviewed.  Patient does not meet the SIRS or Sepsis criteria.  On repeat exam patient does not have a surgical abdomin and there are nor peritoneal signs.  No indication of appendicitis, bowel obstruction, bowel perforation, cholecystitis. Patient discharged home with symptomatic treatment and given strict instructions for follow-up with their primary care physician.  I have also discussed reasons to return immediately to the ER.  Patient expresses understanding and agrees with plan.     Margarita Mail, PA-C 05/24/13 [phone]

## 2013-05-24 NOTE — ED Notes (Signed)
Pt transported to CT ?

## 2013-05-24 NOTE — ED Notes (Signed)
Pt does not want pain medication at this time. ?

## 2013-05-24 NOTE — Discharge Instructions (Signed)
Diverticulitis A diverticulum is a small pouch or sac on the colon. Diverticulosis is the presence of these diverticula on the colon. Diverticulitis is the irritation (inflammation) or infection of diverticula. CAUSES  The colon and its diverticula contain bacteria. If food particles block the tiny opening to a diverticulum, the bacteria inside can grow and cause an increase in pressure. This leads to infection and inflammation and is called diverticulitis. SYMPTOMS   Abdominal pain and tenderness. Usually, the pain is located on the left side of your abdomen. However, it could be located elsewhere.  Fever.  Bloating.  Feeling sick to your stomach (nausea).  Throwing up (vomiting).  Abnormal stools. DIAGNOSIS  Your caregiver will take a history and perform a physical exam. Since many things can cause abdominal pain, other tests may be necessary. Tests may include:  Blood tests.  Urine tests.  X-ray of the abdomen.  CT scan of the abdomen. Sometimes, surgery is needed to determine if diverticulitis or other conditions are causing your symptoms. TREATMENT  Most of the time, you can be treated without surgery. Treatment includes:  Resting the bowels by only having liquids for a few days. As you improve, you will need to eat a low-fiber diet.  Intravenous (IV) fluids if you are losing body fluids (dehydrated).  Antibiotic medicines that treat infections may be given.  Pain and nausea medicine, if needed.  Surgery if the inflamed diverticulum has burst. HOME CARE INSTRUCTIONS   Try a clear liquid diet (broth, tea, or water for as long as directed by your caregiver). You may then gradually begin a low-fiber diet as tolerated.  A low-fiber diet is a diet with less than 10 grams of fiber. Choose the foods below to reduce fiber in the diet:  White breads, cereals, rice, and pasta.  Cooked fruits and vegetables or soft fresh fruits and vegetables without the skin.  Ground or  well-cooked tender beef, ham, veal, lamb, pork, or poultry.  Eggs and seafood.  After your diverticulitis symptoms have improved, your caregiver may put you on a high-fiber diet. A high-fiber diet includes 14 grams of fiber for every 1000 calories consumed. For a standard 2000 calorie diet, you would need 28 grams of fiber. Follow these diet guidelines to help you increase the fiber in your diet. It is important to slowly increase the amount fiber in your diet to avoid gas, constipation, and bloating.  Choose whole-grain breads, cereals, pasta, and brown rice.  Choose fresh fruits and vegetables with the skin on. Do not overcook vegetables because the more vegetables are cooked, the more fiber is lost.  Choose more nuts, seeds, legumes, dried peas, beans, and lentils.  Look for food products that have greater than 3 grams of fiber per serving on the Nutrition Facts label.  Take all medicine as directed by your caregiver.  If your caregiver has given you a follow-up appointment, it is very important that you go. Not going could result in lasting (chronic) or permanent injury, pain, and disability. If there is any problem keeping the appointment, call to reschedule. SEEK MEDICAL CARE IF:   Your pain does not improve.  You have a hard time advancing your diet beyond clear liquids.  Your bowel movements do not return to normal. SEEK IMMEDIATE MEDICAL CARE IF:   Your pain becomes worse.  You have an oral temperature above 102 F (38.9 C), not controlled by medicine.  You have repeated vomiting.  You have bloody or black, tarry stools.    Symptoms that brought you to your caregiver become worse or are not getting better. MAKE SURE YOU:   Understand these instructions.  Will watch your condition.  Will get help right away if you are not doing well or get worse. Document Released: 11/01/2004 Document Revised: 04/16/2011 Document Reviewed: 02/27/2010 ExitCare Patient Information  2014 [employer].   Diet The clear liquid diet consists of foods that are liquid or will become liquid at room temperature. Examples of foods allowed on a clear liquid diet include fruit juice, broth or bouillon, gelatin, or frozen ice pops. You should be able to see through the liquid. The purpose of this diet is to provide the necessary fluids, electrolytes (such as sodium and potassium), and energy to keep the body functioning during times when you are not able to consume a regular diet. A clear liquid diet should not be continued for long periods of time, as it is not nutritionally adequate.  A CLEAR LIQUID DIET MAY BE NEEDED:  When a sudden-onset (acute) condition occurs before or after surgery.   As the first step in oral feeding.   For fluid and electrolyte replacement in diarrheal diseases.   As a diet before certain medical tests are performed.  ADEQUACY The clear liquid diet is adequate only in ascorbic acid, according to the Recommended Dietary Allowances of the National Research Council.  CHOOSING FOODS Breads and Starches  Allowed: None are allowed.   Avoid: All are to be avoided.  Vegetables  Allowed: Strained vegetable juices.   Avoid: Any others.  Fruit  Allowed: Strained fruit juices and fruit drinks. Include 1 serving of citrus or vitamin C-enriched fruit juice daily.   Avoid: Any others.  Meat and Meat Substitutes  Allowed: None are allowed.   Avoid: All are to be avoided.  Milk Products  Allowed: None are allowed.   Avoid: All are to be avoided.  Soups and Combination Foods  Allowed: Clear bouillon, broth, or strained broth-based soups.   Avoid: Any others.  Desserts and Sweets  Allowed: Sugar, honey. High-protein gelatin. Flavored gelatin, ices, or frozen ice pops that do not contain milk.   Avoid: Any others.  Fats and Oils  Allowed: None are allowed.   Avoid: All are to be avoided.   Beverages  Allowed: Cereal beverages, coffee (regular or decaffeinated), tea, or soda at the discretion of your health care provider.   Avoid: Any others.  Condiments  Allowed: Salt.   Avoid: Any others, including pepper.  Supplements  Allowed: Liquid nutrition beverages that you can see through.   Avoid: Any others that contain lactose or fiber. SAMPLE MEAL PLAN Breakfast  4 oz (120 mL) strained orange juice.   to 1 cup (120 to 240 mL) gelatin (plain or fortified).  1 cup (240 mL) beverage (coffee or tea).  Sugar, if desired. Midmorning Snack   cup (120 mL) gelatin (plain or fortified). Lunch  1 cup (240 mL) broth or consomm.  4 oz (120 mL) strained grapefruit juice.   cup (120 mL) gelatin (plain or fortified).  1 cup (240 mL) beverage (coffee or tea).  Sugar, if desired. Midafternoon Snack   cup (120 mL) fruit ice.   cup (120 mL) strained fruit juice. Dinner  1 cup (240 mL) broth or consomm.   cup (120 mL) cranberry juice.   cup (120 mL) flavored gelatin (plain or fortified).  1 cup (240 mL) beverage (coffee or tea).  Sugar, if desired. Evening Snack  4 oz (120 mL)   strained apple juice (vitamin C-fortified).   cup (120 mL) flavored gelatin (plain or fortified). MAKE SURE YOU:  Understand these instructions.  Will watch your child's condition.  Will get help right away if your child is not doing well or gets worse. Document Released: 01/22/2005 Document Revised: 09/24/2012 Document Reviewed: 06/24/2012 ExitCare Patient Information 2014 [employer].  

## 2013-05-24 NOTE — ED Notes (Signed)
Upon speaking to pt. She is tearful and expresses that she is stressed at home because he daughter and grandson are there and her daughter talks down to her and is unappreciative of him. Also she is worried that her daughter will move away to [place] with a guy and take her grandson away from her.

## 2013-05-24 NOTE — ED Provider Notes (Signed)
Medical screening examination/treatment/procedure(s) were performed by non-physician practitioner and as supervising physician I was immediately available for consultation/collaboration.   EKG Interpretation None        Santos Hardwick, MD 05/24/13 2337 

## 2013-05-28 NOTE — ED Provider Notes (Signed)
Medical screening examination/treatment/procedure(s) were conducted as a shared visit with non-physician practitioner(s) and myself.  I personally evaluated the patient during the encounter.   EKG Interpretation None      Patient here with lower abdominal pain. Unclear if urinary source, intra-abdominal. On exam, suprapubic pain, bilateral lower abdominal pain. No peritoneal signs, no concern for surgical abdomen. Will obtain labs, CT abdomen.   Osvaldo Shipper, MD 05/28/13 [phone]

## 2014-02-08 ENCOUNTER — Other Ambulatory Visit: Payer: Self-pay

## 2014-02-08 DIAGNOSIS — Z1231 Encounter for screening mammogram for malignant neoplasm of breast: Secondary | ICD-10-CM

## 2014-02-19 ENCOUNTER — Ambulatory Visit
Admission: RE | Admit: 2014-02-19 | Discharge: 2014-02-19 | Disposition: A | Discharge status: Home / Self Care | Payer: BLUE CROSS/BLUE SHIELD | Source: Ambulatory Visit

## 2014-02-19 ENCOUNTER — Other Ambulatory Visit: Payer: Self-pay

## 2014-02-19 DIAGNOSIS — Z1231 Encounter for screening mammogram for malignant neoplasm of breast: Secondary | ICD-10-CM

## 2014-05-07 ENCOUNTER — Other Ambulatory Visit (HOSPITAL_COMMUNITY)
Admission: RE | Admit: 2014-05-07 | Discharge: 2014-05-07 | Disposition: A | Discharge status: Home / Self Care | Payer: BLUE CROSS/BLUE SHIELD | Source: Ambulatory Visit | Attending: Family Medicine | Admitting: Family Medicine

## 2014-05-07 ENCOUNTER — Other Ambulatory Visit: Payer: Self-pay | Admitting: Family Medicine

## 2014-05-07 DIAGNOSIS — Z1151 Encounter for screening for human papillomavirus (HPV): Secondary | ICD-10-CM | POA: Diagnosis present

## 2014-05-07 DIAGNOSIS — Z124 Encounter for screening for malignant neoplasm of cervix: Secondary | ICD-10-CM | POA: Insufficient documentation

## 2014-05-19 LAB — CYTOLOGY - PAP

## 2014-07-06 ENCOUNTER — Other Ambulatory Visit (HOSPITAL_COMMUNITY): Payer: Self-pay | Admitting: Orthopaedic Surgery

## 2014-07-06 DIAGNOSIS — Z96641 Presence of right artificial hip joint: Secondary | ICD-10-CM

## 2014-08-10 ENCOUNTER — Encounter (HOSPITAL_COMMUNITY)
Admission: RE | Admit: 2014-08-10 | Discharge: 2014-08-10 | Disposition: A | Discharge status: Home / Self Care | Payer: BLUE CROSS/BLUE SHIELD | Source: Ambulatory Visit | Attending: Orthopaedic Surgery | Admitting: Orthopaedic Surgery

## 2014-08-10 DIAGNOSIS — Z9660 Presence of unspecified orthopedic joint implant: Secondary | ICD-10-CM | POA: Diagnosis not present

## 2014-08-10 DIAGNOSIS — Z96641 Presence of right artificial hip joint: Secondary | ICD-10-CM

## 2014-08-10 MED ORDER — TECHNETIUM TC 99M MEDRONATE IV KIT
26.0000 | PACK | Freq: Once | INTRAVENOUS | Status: AC | PRN
  Administered 2014-08-10: 26 via INTRAVENOUS

## 2014-11-12 ENCOUNTER — Other Ambulatory Visit (HOSPITAL_COMMUNITY): Payer: Self-pay | Admitting: General Surgery

## 2014-11-18 ENCOUNTER — Other Ambulatory Visit (HOSPITAL_COMMUNITY): Payer: Self-pay | Admitting: General Surgery

## 2014-12-24 ENCOUNTER — Encounter: Payer: BLUE CROSS/BLUE SHIELD | Attending: General Surgery | Admitting: Dietician

## 2014-12-24 ENCOUNTER — Encounter: Payer: Self-pay | Admitting: Dietician

## 2014-12-24 DIAGNOSIS — Z713 Dietary counseling and surveillance: Secondary | ICD-10-CM | POA: Diagnosis not present

## 2014-12-24 DIAGNOSIS — Z6841 Body Mass Index (BMI) 40.0 and over, adult: Secondary | ICD-10-CM | POA: Diagnosis not present

## 2014-12-24 NOTE — Progress Notes (Signed)
  Pre-Op Assessment Visit:  Pre-Operative Sleeve gastrectomy Surgery  Medical Nutrition Therapy:  Appt start time: [id number]   End time:  0900.  Patient was seen on 12/24/2014 for Pre-Operative Nutrition Assessment. Assessment and letter of approval faxed to [facility] Program coordinator on 12/24/2014.   Preferred Learning Style:   No preference indicated   Learning Readiness:   Ready  Handouts given during visit include:  Pre-Op Goals Bariatric Surgery Protein Shakes   During the appointment today the following Pre-Op Goals were reviewed with the patient: Maintain or lose weight as instructed by your surgeon Make healthy food choices Begin to limit portion sizes Limited concentrated sugars and fried foods Keep fat/sugar in the single digits per serving on   food labels Practice CHEWING your food  (aim for 30 chews per bite or until applesauce consistency) Practice not drinking 15 minutes before, during, and 30 minutes after each meal/snack Avoid all carbonated beverages  Avoid/limit caffeinated beverages  Avoid all sugar-sweetened beverages Consume 3 meals per day; eat every 3-5 hours Make a list of non-food related activities Aim for 64-100 ounces of FLUID daily  Aim for at least 60-80 grams of PROTEIN daily Look for a liquid protein source that contain ?15 g protein and ?5 g carbohydrate  (ex: shakes, drinks, shots)  Demonstrated degree of understanding via:  Teach Back  Teaching Method Utilized:  Visual Auditory Hands on  Barriers to learning/adherence to lifestyle change: none  Patient to call the [facility] to enroll in Pre-Op and Post-Op Nutrition Education when surgery date is scheduled.

## 2015-02-04 ENCOUNTER — Encounter: Payer: BLUE CROSS/BLUE SHIELD | Attending: General Surgery | Admitting: Dietician

## 2015-02-04 DIAGNOSIS — Z6841 Body Mass Index (BMI) 40.0 and over, adult: Secondary | ICD-10-CM | POA: Diagnosis not present

## 2015-02-04 DIAGNOSIS — Z713 Dietary counseling and surveillance: Secondary | ICD-10-CM | POA: Insufficient documentation

## 2015-02-04 NOTE — Patient Instructions (Addendum)
-  Keep working on [employer] out [employer] -Aim to do walking DVD 2-3x a week -Try an approved protein shake

## 2015-02-04 NOTE — Progress Notes (Signed)
Supervised Weight Loss:  Appt start time: 0810 end time:  0825.  SWL visit 1:  Primary concerns today: Jelesia returns having gained 4.8 lbs since initial assessment last month. 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 Tried to stay on track with foods during the holidays but found this difficult. Feels like she eats out too often. Has been working on chewing thoroughly and eating slowly but states this is a challenge during lunch at work because she has limited time.   MEDICATIONS: see list  DIETARY INTAKE:  24-hr recall:  B ( AM): Life or Captain Crunch cereal OR oatmeal  Snk ( AM): nuts or cheese or yogurt  L ( PM): homemade vegetable and sausage soup  Snk ( PM): candy at work D ( PM): BLT sandwich OR rotisserie chicken and macaroni and cheese  Snk ( PM): sometimes ice cream  Beverages: water, unsweet tea, 2% milk  Recent physical activity: none  Estimated energy needs: 1400-1600 calories  Progress Towards Goal(s):  In progress.   Nutritional Diagnosis:  [place]-3.3 Overweight/obesity related to past poor dietary habits and physical inactivity as evidenced by patient in SWL for pending bariatric surgery following dietary guidelines for continued weight loss.     Intervention:  Nutrition counseling provided. Plan: -Keep working on phasing out Pepsi -Aim to do walking DVD 2-3x a week -Try an approved protein shake  Monitoring/Evaluation:  Dietary intake, exercise, and body weight in 4 week(s).

## 2015-03-04 ENCOUNTER — Encounter: Payer: Self-pay | Admitting: Dietician

## 2015-03-04 ENCOUNTER — Encounter: Payer: BLUE CROSS/BLUE SHIELD | Attending: General Surgery | Admitting: Dietician

## 2015-03-04 DIAGNOSIS — Z6841 Body Mass Index (BMI) 40.0 and over, adult: Secondary | ICD-10-CM | POA: Insufficient documentation

## 2015-03-04 DIAGNOSIS — Z713 Dietary counseling and surveillance: Secondary | ICD-10-CM | POA: Diagnosis not present

## 2015-03-04 NOTE — Progress Notes (Signed)
Supervised Weight Loss:  Appt start time: 0800 end time:  0815.  SWL visit 2:  Primary concerns today: Traci Roy returns having gained 0.5 lbs in the last month.  Has pretty much completely cut out Pepsi. Has still been going to her dance class 1x a week. Found Premier protein shake but does not like the amount of "fake sugar" and chemicals.    Samples provided and patient instructed on proper use:  [place] "Planted" pea protein powder (unflavored - qty 1) Lot#: [id number] Exp: 11/2016  Unjury "Planted" pea protein powder (chocolate - qty 1)   Lot#: [id number] Exp: 11/2016  Unjury protein powder (chocolate - qty 1) Lot#: [id number] Exp: 08/2015  Unjury protein powder (strawberry - qty 1) Lot#: [id number] Exp: 12/2015  Unjury protein powder (vanilla - qty 1) Lot#: [id number] Exp: 10/2015   Weight: 258.7 lbs BMI: 44.5  MEDICATIONS: see list  DIETARY INTAKE:  24-hr recall:  B ( AM): Life or Captain Crunch cereal OR oatmeal  Snk ( AM): nuts or cheese or yogurt  L ( PM): homemade vegetable and sausage soup  Snk ( PM): candy at work D ( PM): BLT sandwich OR rotisserie chicken and macaroni and cheese  Snk ( PM): sometimes ice cream  Beverages: water, unsweet tea, 2% milk  Recent physical activity: none  Estimated energy needs: 1400-1600 calories  Progress Towards Goal(s):  In progress.   Nutritional Diagnosis:  [place]-3.3 Overweight/obesity related to past poor dietary habits and physical inactivity as evidenced by patient in SWL for pending bariatric surgery following dietary guidelines for continued weight loss.     Intervention:  Nutrition counseling provided. Plan: -Keep working on phasing out Pepsi -Aim to do walking DVD 2-3x a week -Try another approved protein shake -Work on chewing thoroughly, eating slowly, and taking tiny bites  Monitoring/Evaluation:  Dietary intake, exercise, and body weight in 4 week(s).

## 2015-03-04 NOTE — Patient Instructions (Signed)
Plan: -Keep working on phasing out Pepsi -Aim to do walking DVD 2-3x a week -Try another approved protein shake -Work on chewing thoroughly, eating slowly, and taking tiny bites

## 2015-04-01 ENCOUNTER — Encounter: Payer: Self-pay | Admitting: Skilled Nursing Facility1

## 2015-04-01 ENCOUNTER — Encounter: Payer: BLUE CROSS/BLUE SHIELD | Attending: General Surgery | Admitting: Skilled Nursing Facility1

## 2015-04-01 DIAGNOSIS — Z713 Dietary counseling and surveillance: Secondary | ICD-10-CM | POA: Insufficient documentation

## 2015-04-01 DIAGNOSIS — Z6841 Body Mass Index (BMI) 40.0 and over, adult: Secondary | ICD-10-CM | POA: Diagnosis not present

## 2015-04-01 NOTE — Progress Notes (Signed)
Supervised Weight Loss:  Appt start time: 9:15 end time:  9:30.  SWL visit 3:  Primary concerns today: Traci Roy returns having lost 4 lbs in the last month. Pt states she is very concerned for the surgery as well as being able to get the proper nutrients in her body after surgery. Pt states she absolutely wants to lose weight but she is more concerned with being healthy. Pt states she is concerned fruits are not consumed after surgery because they have nutrients and are healthy. Dietitian detailed the physiology of her body after surgery and why the recommendations are what they are. Dietitian also described the purpose of having SWL and dietitians work with the bariatric population. Once Dietitian explained supplementation and the goals of teh recommendations the pt stated she felt better. Pt states she is still anxious to hurry up and get over with the surgery. Dietitian offered support group as an option but pt states she would find that uncomfortable. Pt states she really misses pepsi and still sips on it sometimes. Pt states she does not drink premier protein because it has soy in it and she states soy is bad for you. Dietitian educated the pt on the current research for soy-pt still did not want to drink protein products with long worded ingredients. Pt states she has a powder from [place] and stated it has 5 grams of carbohydrate but does not know its protein amount. Pt has not been working on drinking with meals. Pt states she is concerned Dr. Kieth Roy is preforming her surgery simply because he is young. Dietitian assuaged her concerns by educating the pt on [place] being an accredited program and all surgeons needing substantial experience/education; pt stated she felt a little better now.   Physical activity: dance lessons with her husband   Weight: 254 lbs BMI: 43.7  MEDICATIONS: see list  DIETARY INTAKE:  24-hr recall:  B ( AM): Life or Captain Crunch cereal OR oatmeal  Snk ( AM): nuts  or cheese or yogurt  L ( PM): homemade vegetable and sausage soup  Snk ( PM): candy at work D ( PM): BLT sandwich OR rotisserie chicken and macaroni and cheese  Snk ( PM): sometimes ice cream  Beverages: water, unsweet tea, 2% milk  Recent physical activity: none  Estimated energy needs: 1400-1600 calories  Progress Towards Goal(s):  In progress.   Nutritional Diagnosis:  [name]-3.3 Overweight/obesity related to past poor dietary habits and physical inactivity as evidenced by patient in SWL for pending bariatric surgery following dietary guidelines for continued weight loss.     Intervention:  Nutrition counseling provided. Plan: -Keep working on phasing out [place] your protein powder for protein content -Work on chewing thoroughly, eating slowly, and taking tiny bites -Work on not drinking fluid 15 minutes before, during, and 30 minutes after a meal  Monitoring/Evaluation:  Dietary intake, exercise, and body weight in 4 week(s).

## 2015-04-29 ENCOUNTER — Encounter: Payer: Self-pay | Admitting: Skilled Nursing Facility1

## 2015-04-29 ENCOUNTER — Encounter: Payer: BLUE CROSS/BLUE SHIELD | Attending: General Surgery | Admitting: Skilled Nursing Facility1

## 2015-04-29 DIAGNOSIS — Z6841 Body Mass Index (BMI) 40.0 and over, adult: Secondary | ICD-10-CM | POA: Diagnosis not present

## 2015-04-29 DIAGNOSIS — Z713 Dietary counseling and surveillance: Secondary | ICD-10-CM | POA: Diagnosis not present

## 2015-04-29 NOTE — Progress Notes (Signed)
Supervised Weight Loss:  Appt start time: 9:15 end time:  9:30.  SWL visit 4:  Primary concerns today: Verdie returns having maintained weight. Pt is feeling very inpatient and wants to have the surgery as soon as possible. Pt states she has a Shake for breakfast and then a midmorning breakfast bar. Pt states her protein powder has 13 grams of protein: Dietitian reviewed the protein recommendations with her.   Physical activity: dance lessons with her husband, now zumba once a week, considering doing something else   Weight: 254 lbs BMI: 43.7   MEDICATIONS: see list  Beverages: water, unsweet tea, 2% milk   Estimated energy needs: 1400-1600 calories  Progress Towards Goal(s):  In progress.   Nutritional Diagnosis:  [place]-3.3 Overweight/obesity related to past poor dietary habits and physical inactivity as evidenced by patient in SWL for pending bariatric surgery following dietary guidelines for continued weight loss.     Intervention:  Nutrition counseling provided. Plan: -Keep working on [employer] out [employer] -Work on chewing thoroughly, eating slowly, and taking tiny bites -Work on not drinking fluid 15 minutes before, during, and 30 minutes after a meal  Monitoring/Evaluation:  Dietary intake, exercise, and body weight in 4 week(s).

## 2015-05-09 ENCOUNTER — Other Ambulatory Visit (HOSPITAL_COMMUNITY): Payer: Self-pay | Admitting: General Surgery

## 2015-05-13 ENCOUNTER — Ambulatory Visit (HOSPITAL_COMMUNITY)
Admission: RE | Admit: 2015-05-13 | Discharge: 2015-05-13 | Disposition: A | Discharge status: Home / Self Care | Payer: BLUE CROSS/BLUE SHIELD | Source: Ambulatory Visit | Attending: General Surgery | Admitting: General Surgery

## 2015-05-13 DIAGNOSIS — K224 Dyskinesia of esophagus: Secondary | ICD-10-CM | POA: Insufficient documentation

## 2015-05-13 DIAGNOSIS — K449 Diaphragmatic hernia without obstruction or gangrene: Secondary | ICD-10-CM | POA: Diagnosis not present

## 2015-05-23 ENCOUNTER — Other Ambulatory Visit: Payer: Self-pay

## 2015-05-23 DIAGNOSIS — Z1231 Encounter for screening mammogram for malignant neoplasm of breast: Secondary | ICD-10-CM

## 2015-05-27 ENCOUNTER — Encounter: Payer: BLUE CROSS/BLUE SHIELD | Attending: General Surgery | Admitting: Skilled Nursing Facility1

## 2015-05-27 ENCOUNTER — Encounter: Payer: Self-pay | Admitting: Skilled Nursing Facility1

## 2015-05-27 DIAGNOSIS — Z713 Dietary counseling and surveillance: Secondary | ICD-10-CM | POA: Diagnosis not present

## 2015-05-27 DIAGNOSIS — Z6841 Body Mass Index (BMI) 40.0 and over, adult: Secondary | ICD-10-CM | POA: Insufficient documentation

## 2015-05-27 NOTE — Progress Notes (Signed)
Supervised Weight Loss:  Appt start time: 9:15 end time:  9:30.  SWL visit 4:  Primary concerns today: Traci Roy returns having lost 1 pound. Pt is feeling very impatient and wants to have the surgery as soon as possible. Pt states she has a Shake for breakfast and then a midmorning breakfast bar. Pt showed dietitian a picture of her protein powder nutrients which had 60 grams of protein and 8 grams of carbohydrates: Dietitian reviewed the protein shake recommendations and the bodies inability to absorb 60 grams of protein at once. Pt states she has switched from 2% milk to whole milk because she read somwhere whole milk has more nutrients: Dietitian advised the pt to consume fat free milk and educated her on the difference between the different percentage milks. Pt states she still struggles with not drinking during meals but has been able to work on limiting her soda consumption. Pt states when she eats pizza and pasta these are triggers for soda consumption. Pt states she is going to call [facility] for her SWL requirements.    Physical activity: dance lessons with her husband, now zumba once a week, considering doing something else   Weight: 253 lbs BMI: 43.5   MEDICATIONS: see list  Beverages: water, whole milk, soda   Estimated energy needs: 1400-1600 calories  Progress Towards Goal(s):  In progress.   Nutritional Diagnosis:  [place]-3.3 Overweight/obesity related to past poor dietary habits and physical inactivity as evidenced by patient in SWL for pending bariatric surgery following dietary guidelines for continued weight loss.     Intervention:  Nutrition counseling provided. Plan: -Keep working on [employer] out [employer] -Work on chewing thoroughly, eating slowly, and taking tiny bites -Work on not drinking fluid 15 minutes before, during, and 30 minutes after a meal  Monitoring/Evaluation:  Dietary intake, exercise, and body weight in 4 week(s).

## 2015-06-03 ENCOUNTER — Ambulatory Visit: Payer: BLUE CROSS/BLUE SHIELD

## 2015-06-10 ENCOUNTER — Ambulatory Visit: Payer: BLUE CROSS/BLUE SHIELD

## 2015-06-16 ENCOUNTER — Ambulatory Visit (INDEPENDENT_AMBULATORY_CARE_PROVIDER_SITE_OTHER): Payer: BLUE CROSS/BLUE SHIELD | Admitting: Psychiatry

## 2015-06-23 ENCOUNTER — Ambulatory Visit (INDEPENDENT_AMBULATORY_CARE_PROVIDER_SITE_OTHER): Payer: BLUE CROSS/BLUE SHIELD | Admitting: Psychiatry

## 2015-06-24 ENCOUNTER — Encounter: Payer: BLUE CROSS/BLUE SHIELD | Attending: General Surgery | Admitting: Skilled Nursing Facility1

## 2015-06-24 VITALS — Ht 64.0 in | Wt 254.0 lb

## 2015-06-24 DIAGNOSIS — Z713 Dietary counseling and surveillance: Secondary | ICD-10-CM | POA: Insufficient documentation

## 2015-06-24 DIAGNOSIS — E669 Obesity, unspecified: Secondary | ICD-10-CM

## 2015-06-24 DIAGNOSIS — Z6841 Body Mass Index (BMI) 40.0 and over, adult: Secondary | ICD-10-CM | POA: Diagnosis not present

## 2015-06-24 NOTE — Progress Notes (Signed)
Supervised Weight Loss:  Appt start time: 9:15 end time:  9:30.  SWL visit 6:  Primary concerns today: Mahima returns having gained 1 pound. Pt states she has a small hiatal hernia in her esophagus but it has not effected her at all. Pt states she can chew and swallow with no difficulty. Pt states this visit completes her SWL visits.  Physical activity: dance lessons with her husband, now zumba once a week, considering doing something else   Weight: 254 lbs BMI: 43.7   MEDICATIONS: see list  Beverages: water, whole milk, soda   Estimated energy needs: 1400-1600 calories  Progress Towards Goal(s):  In progress.   Nutritional Diagnosis:  [place]-3.3 Overweight/obesity related to past poor dietary habits and physical inactivity as evidenced by patient in SWL for pending bariatric surgery following dietary guidelines for continued weight loss.     Intervention:  Nutrition counseling provided. Plan: -Keep working on [employer] out [employer] -Work on chewing thoroughly, eating slowly, and taking tiny bites -Work on not drinking fluid 15 minutes before, during, and 30 minutes after a meal  Monitoring/Evaluation:  Dietary intake, exercise, and body weight in 4 week(s).

## 2015-07-01 ENCOUNTER — Ambulatory Visit: Payer: BLUE CROSS/BLUE SHIELD

## 2015-07-15 ENCOUNTER — Ambulatory Visit: Payer: BLUE CROSS/BLUE SHIELD

## 2015-07-22 ENCOUNTER — Ambulatory Visit: Payer: Self-pay | Admitting: General Surgery

## 2015-07-25 ENCOUNTER — Ambulatory Visit: Payer: Self-pay

## 2015-07-27 ENCOUNTER — Other Ambulatory Visit (HOSPITAL_COMMUNITY): Payer: Self-pay | Admitting: *Deleted

## 2015-07-27 NOTE — Patient Instructions (Addendum)
Traci Roy  07/27/2015   Your procedure is scheduled on: 08-08-15  Report to [place]  Entrance take [facility]  elevators to 3rd floor to  [place] at 800  AM.  Call this number if you have problems the morning of surgery [phone]   Remember: ONLY 1 PERSON MAY GO WITH YOU TO SHORT STAY TO GET  READY MORNING OF [place].  Do not eat food or drink liquids :After Midnight.     Take these medicines the morning of surgery with A SIP OF WATER: escitalopram (lexapro)                              You may not have any metal on your body including hair pins and              piercings  Do not wear jewelry, make-up, lotions, powders or perfumes, deodorant             Do not wear nail polish.  Do not shave  48 hours prior to surgery.              Men may shave face and neck.   Do not bring valuables to the hospital. [place].  Contacts, dentures or bridgework may not be worn into surgery.  Leave suitcase in the car. After surgery it may be brought to your room.                 Please read over the following fact sheets you were given: _____________________________________________________________________             [facility] - Preparing for Surgery Before surgery, you can play an important role.  Because skin is not sterile, your skin needs to be as free of germs as possible.  You can reduce the number of germs on your skin by washing with CHG (chlorahexidine gluconate) soap before surgery.  CHG is an antiseptic cleaner which kills germs and bonds with the skin to continue killing germs even after washing. Please DO NOT use if you have an allergy to CHG or antibacterial soaps.  If your skin becomes reddened/irritated stop using the CHG and inform your nurse when you arrive at Short Stay. Do not shave (including legs and underarms) for at least 48 hours prior to the first CHG shower.  You may shave  your face/neck. Please follow these instructions carefully:  1.  Shower with CHG Soap the night before surgery and the  morning of Surgery.  2.  If you choose to wash your hair, wash your hair first as usual with your  normal  shampoo.  3.  After you shampoo, rinse your hair and body thoroughly to remove the  shampoo.                           4.  Use CHG as you would any other liquid soap.  You can apply chg directly  to the skin and wash                       Gently with a scrungie or clean washcloth.  5.  Apply the CHG Soap to  your body ONLY FROM THE NECK DOWN.   Do not use on face/ open                           Wound or open sores. Avoid contact with eyes, ears mouth and genitals (private parts).                       Wash face,  Genitals (private parts) with your normal soap.             6.  Wash thoroughly, paying special attention to the area where your surgery  will be performed.  7.  Thoroughly rinse your body with warm water from the neck down.  8.  DO NOT shower/wash with your normal soap after using and rinsing off  the CHG Soap.                9.  Pat yourself dry with a clean towel.            10.  Wear clean pajamas.            11.  Place clean sheets on your bed the night of your first shower and do not  sleep with pets. Day of Surgery : Do not apply any lotions/deodorants the morning of surgery.  Please wear clean clothes to the hospital/surgery center.  FAILURE TO FOLLOW THESE INSTRUCTIONS MAY RESULT IN THE CANCELLATION OF YOUR SURGERY PATIENT SIGNATURE_________________________________  NURSE SIGNATURE__________________________________  ________________________________________________________________________

## 2015-07-27 NOTE — Progress Notes (Signed)
ekg 05-13-15 epic

## 2015-07-29 ENCOUNTER — Encounter (HOSPITAL_COMMUNITY): Payer: Self-pay

## 2015-07-29 ENCOUNTER — Encounter (HOSPITAL_COMMUNITY)
Admission: RE | Admit: 2015-07-29 | Discharge: 2015-07-29 | Disposition: A | Discharge status: Home / Self Care | Payer: BLUE CROSS/BLUE SHIELD | Source: Ambulatory Visit | Attending: General Surgery | Admitting: General Surgery

## 2015-07-29 ENCOUNTER — Other Ambulatory Visit (HOSPITAL_COMMUNITY): Payer: Self-pay

## 2015-07-29 DIAGNOSIS — Z6841 Body Mass Index (BMI) 40.0 and over, adult: Secondary | ICD-10-CM | POA: Diagnosis not present

## 2015-07-29 DIAGNOSIS — Z01812 Encounter for preprocedural laboratory examination: Secondary | ICD-10-CM | POA: Diagnosis present

## 2015-07-29 DIAGNOSIS — E669 Obesity, unspecified: Secondary | ICD-10-CM | POA: Diagnosis not present

## 2015-07-29 HISTORY — DX: Restless legs syndrome: G25.81

## 2015-07-29 LAB — CBC WITH DIFFERENTIAL/PLATELET
BASOS PCT: 0 %
Basophils Absolute: 0 10*3/uL (ref 0.0–0.1)
EOS ABS: 0.1 10*3/uL (ref 0.0–0.7)
EOS PCT: 2 %
HCT: 39.2 % (ref 36.0–46.0)
Hemoglobin: 13.3 g/dL (ref 12.0–15.0)
LYMPHS ABS: 2.2 10*3/uL (ref 0.7–4.0)
Lymphocytes Relative: 35 %
MCH: 30.4 pg (ref 26.0–34.0)
MCHC: 33.9 g/dL (ref 30.0–36.0)
MCV: 89.5 fL (ref 78.0–100.0)
MONOS PCT: 9 %
Monocytes Absolute: 0.5 10*3/uL (ref 0.1–1.0)
Neutro Abs: 3.4 10*3/uL (ref 1.7–7.7)
Neutrophils Relative %: 54 %
PLATELETS: 243 10*3/uL (ref 150–400)
RBC: 4.38 MIL/uL (ref 3.87–5.11)
RDW: 12.8 % (ref 11.5–15.5)
WBC: 6.3 10*3/uL (ref 4.0–10.5)

## 2015-07-29 LAB — COMPREHENSIVE METABOLIC PANEL
ALBUMIN: 4.4 g/dL (ref 3.5–5.0)
ALT: 26 U/L (ref 14–54)
ANION GAP: 7 (ref 5–15)
AST: 29 U/L (ref 15–41)
Alkaline Phosphatase: 71 U/L (ref 38–126)
BUN: 21 mg/dL — ABNORMAL HIGH (ref 6–20)
CHLORIDE: 105 mmol/L (ref 101–111)
CO2: 28 mmol/L (ref 22–32)
Calcium: 9.4 mg/dL (ref 8.9–10.3)
Creatinine, Ser: 0.92 mg/dL (ref 0.44–1.00)
GFR calc non Af Amer: >60 mL/min (ref >60)
Glucose, Bld: 118 mg/dL — ABNORMAL HIGH (ref 65–99)
POTASSIUM: 4.8 mmol/L (ref 3.5–5.1)
SODIUM: 140 mmol/L (ref 135–145)
Total Bilirubin: 1 mg/dL (ref 0.3–1.2)
Total Protein: 7.4 g/dL (ref 6.5–8.1)

## 2015-07-29 LAB — SURGICAL PCR SCREEN
MRSA, PCR: POSITIVE — AB
Staphylococcus aureus: POSITIVE — AB

## 2015-08-01 ENCOUNTER — Encounter: Payer: BLUE CROSS/BLUE SHIELD | Attending: General Surgery

## 2015-08-01 DIAGNOSIS — Z6841 Body Mass Index (BMI) 40.0 and over, adult: Secondary | ICD-10-CM | POA: Diagnosis not present

## 2015-08-01 DIAGNOSIS — Z713 Dietary counseling and surveillance: Secondary | ICD-10-CM | POA: Diagnosis not present

## 2015-08-02 NOTE — Progress Notes (Signed)
  Pre-Operative Nutrition Class:  Appt start time: 1610   End time:  1830.  Patient was seen on 08/01/2015 for Pre-Operative Bariatric Surgery [facility]   Surgery date: 08/08/2015 Surgery type: sleeve gastrectomy Start weight at [facility]: 254 lbs on 12/24/2014 Weight today: 249.4 lbs  TANITA  BODY COMP RESULTS  08/01/15   BMI (kg/m^2) 42.8   Fat Mass (lbs) 123.2   Fat Free Mass (lbs) 126.2   Total Body Water (lbs) 92.2   Samples given per MNT protocol. Patient educated on appropriate usage: Bariatric Advantage Multivitamin chewable (mixed fruit - qty 1) Lot #: [id number] Exp: 08/2016  Bariatric Advantage Calcium Citrate chew (orange - qty 1) Lot #: [id number] Exp: 09/2015  Premier protein shake (strawberry - qty 1) Lot #: [id number] Exp: 06/2016  Renee Pain Protein Powder (chocolate - qty 1) Lot #: [id number] Exp: 12/2016  The following the learning objectives were met by the patient during this course:  Identify Pre-Op Dietary Goals and will begin 2 weeks pre-operatively  Identify appropriate sources of fluids and proteins   State protein recommendations and appropriate sources pre and post-operatively  Identify Post-Operative Dietary Goals and will follow for 2 weeks post-operatively  Identify appropriate multivitamin and calcium sources  Describe the need for physical activity post-operatively and will follow MD recommendations  State when to call healthcare provider regarding medication questions or post-operative complications  Handouts given during class include:  Pre-Op Bariatric Surgery Diet Handout  Protein Shake Handout  Post-Op Bariatric Surgery Nutrition Handout  BELT Program Information Flyer  Support Group Information Flyer  [facility] Bariatric Supplements Price List  Follow-Up Plan: Patient will follow-up at [facility] 2 weeks post operatively for diet advancement per MD.

## 2015-08-05 DIAGNOSIS — Z0000 Encounter for general adult medical examination without abnormal findings: Secondary | ICD-10-CM | POA: Diagnosis not present

## 2015-08-08 ENCOUNTER — Encounter (HOSPITAL_COMMUNITY): Admission: RE | Payer: Self-pay | Source: Ambulatory Visit

## 2015-08-08 ENCOUNTER — Inpatient Hospital Stay (HOSPITAL_COMMUNITY): Admission: RE | Admit: 2015-08-08 | Payer: BLUE CROSS/BLUE SHIELD | Source: Ambulatory Visit

## 2015-08-08 SURGERY — GASTRECTOMY, SLEEVE, LAPAROSCOPIC
Anesthesia: General

## 2015-08-16 ENCOUNTER — Other Ambulatory Visit: Payer: Self-pay | Admitting: Family Medicine

## 2015-08-16 DIAGNOSIS — Z1231 Encounter for screening mammogram for malignant neoplasm of breast: Secondary | ICD-10-CM

## 2015-08-23 ENCOUNTER — Ambulatory Visit: Payer: Self-pay

## 2015-09-16 DIAGNOSIS — F331 Major depressive disorder, recurrent, moderate: Secondary | ICD-10-CM | POA: Diagnosis not present

## 2015-09-16 DIAGNOSIS — M722 Plantar fascial fibromatosis: Secondary | ICD-10-CM | POA: Diagnosis not present

## 2015-09-23 ENCOUNTER — Ambulatory Visit: Payer: Self-pay

## 2015-10-21 ENCOUNTER — Ambulatory Visit: Payer: Self-pay

## 2016-02-01 DIAGNOSIS — I10 Essential (primary) hypertension: Secondary | ICD-10-CM | POA: Diagnosis not present

## 2016-02-01 DIAGNOSIS — G5762 Lesion of plantar nerve, left lower limb: Secondary | ICD-10-CM | POA: Diagnosis not present

## 2016-02-01 DIAGNOSIS — E785 Hyperlipidemia, unspecified: Secondary | ICD-10-CM | POA: Diagnosis not present

## 2016-02-01 DIAGNOSIS — R5382 Chronic fatigue, unspecified: Secondary | ICD-10-CM | POA: Diagnosis not present

## 2016-02-03 ENCOUNTER — Other Ambulatory Visit: Payer: Self-pay | Admitting: Family Medicine

## 2016-02-03 DIAGNOSIS — N644 Mastodynia: Secondary | ICD-10-CM

## 2016-02-08 ENCOUNTER — Other Ambulatory Visit: Payer: Self-pay | Admitting: Family Medicine

## 2016-02-08 DIAGNOSIS — N644 Mastodynia: Secondary | ICD-10-CM

## 2016-02-20 DIAGNOSIS — M67911 Unspecified disorder of synovium and tendon, right shoulder: Secondary | ICD-10-CM | POA: Diagnosis not present

## 2016-03-09 ENCOUNTER — Other Ambulatory Visit: Payer: Self-pay

## 2016-03-09 ENCOUNTER — Other Ambulatory Visit: Payer: Self-pay | Admitting: Family Medicine

## 2016-03-09 ENCOUNTER — Ambulatory Visit
Admission: RE | Admit: 2016-03-09 | Discharge: 2016-03-09 | Disposition: A | Discharge status: Home / Self Care | Payer: BLUE CROSS/BLUE SHIELD | Source: Ambulatory Visit | Attending: Family Medicine | Admitting: Family Medicine

## 2016-03-09 DIAGNOSIS — N644 Mastodynia: Secondary | ICD-10-CM

## 2016-03-09 DIAGNOSIS — R921 Mammographic calcification found on diagnostic imaging of breast: Secondary | ICD-10-CM | POA: Diagnosis not present

## 2016-05-29 DIAGNOSIS — R5382 Chronic fatigue, unspecified: Secondary | ICD-10-CM | POA: Diagnosis not present

## 2016-07-31 DIAGNOSIS — H1045 Other chronic allergic conjunctivitis: Secondary | ICD-10-CM | POA: Diagnosis not present

## 2016-08-05 DIAGNOSIS — L299 Pruritus, unspecified: Secondary | ICD-10-CM | POA: Diagnosis not present

## 2016-08-05 DIAGNOSIS — T7840XA Allergy, unspecified, initial encounter: Secondary | ICD-10-CM | POA: Diagnosis not present

## 2016-08-17 DIAGNOSIS — M79671 Pain in right foot: Secondary | ICD-10-CM | POA: Diagnosis not present

## 2016-08-17 DIAGNOSIS — M79672 Pain in left foot: Secondary | ICD-10-CM | POA: Diagnosis not present

## 2016-08-29 DIAGNOSIS — R739 Hyperglycemia, unspecified: Secondary | ICD-10-CM | POA: Diagnosis not present

## 2016-08-29 DIAGNOSIS — R1030 Lower abdominal pain, unspecified: Secondary | ICD-10-CM | POA: Diagnosis not present

## 2016-10-19 DIAGNOSIS — Z08 Encounter for follow-up examination after completed treatment for malignant neoplasm: Secondary | ICD-10-CM | POA: Diagnosis not present

## 2016-10-19 DIAGNOSIS — Z85828 Personal history of other malignant neoplasm of skin: Secondary | ICD-10-CM | POA: Diagnosis not present

## 2016-10-19 DIAGNOSIS — L821 Other seborrheic keratosis: Secondary | ICD-10-CM | POA: Diagnosis not present

## 2016-10-19 DIAGNOSIS — L918 Other hypertrophic disorders of the skin: Secondary | ICD-10-CM | POA: Diagnosis not present

## 2016-10-19 DIAGNOSIS — M25572 Pain in left ankle and joints of left foot: Secondary | ICD-10-CM | POA: Diagnosis not present

## 2016-10-19 DIAGNOSIS — Z1283 Encounter for screening for malignant neoplasm of skin: Secondary | ICD-10-CM | POA: Diagnosis not present

## 2017-01-15 DIAGNOSIS — S76311A Strain of muscle, fascia and tendon of the posterior muscle group at thigh level, right thigh, initial encounter: Secondary | ICD-10-CM | POA: Diagnosis not present

## 2017-01-15 DIAGNOSIS — Z5181 Encounter for therapeutic drug level monitoring: Secondary | ICD-10-CM | POA: Diagnosis not present

## 2017-01-17 ENCOUNTER — Ambulatory Visit
Admission: RE | Admit: 2017-01-17 | Discharge: 2017-01-17 | Disposition: A | Discharge status: Home / Self Care | Payer: BLUE CROSS/BLUE SHIELD | Source: Ambulatory Visit | Attending: Family Medicine | Admitting: Family Medicine

## 2017-01-17 ENCOUNTER — Other Ambulatory Visit: Payer: Self-pay | Admitting: Family Medicine

## 2017-01-17 DIAGNOSIS — M79604 Pain in right leg: Secondary | ICD-10-CM

## 2017-01-18 DIAGNOSIS — M5431 Sciatica, right side: Secondary | ICD-10-CM | POA: Diagnosis not present

## 2017-01-25 ENCOUNTER — Other Ambulatory Visit: Payer: Self-pay | Admitting: Family Medicine

## 2017-01-25 DIAGNOSIS — M5431 Sciatica, right side: Secondary | ICD-10-CM

## 2017-02-02 ENCOUNTER — Ambulatory Visit
Admission: RE | Admit: 2017-02-02 | Discharge: 2017-02-02 | Disposition: A | Discharge status: Home / Self Care | Payer: Self-pay | Source: Ambulatory Visit | Attending: Family Medicine | Admitting: Family Medicine

## 2017-02-02 DIAGNOSIS — M5431 Sciatica, right side: Secondary | ICD-10-CM

## 2017-02-02 DIAGNOSIS — M48061 Spinal stenosis, lumbar region without neurogenic claudication: Secondary | ICD-10-CM | POA: Diagnosis not present

## 2017-02-08 DIAGNOSIS — Z6841 Body Mass Index (BMI) 40.0 and over, adult: Secondary | ICD-10-CM | POA: Diagnosis not present

## 2017-02-08 DIAGNOSIS — I10 Essential (primary) hypertension: Secondary | ICD-10-CM | POA: Diagnosis not present

## 2017-02-08 DIAGNOSIS — M5417 Radiculopathy, lumbosacral region: Secondary | ICD-10-CM | POA: Diagnosis not present

## 2017-02-08 DIAGNOSIS — M5127 Other intervertebral disc displacement, lumbosacral region: Secondary | ICD-10-CM | POA: Diagnosis not present

## 2017-02-15 DIAGNOSIS — R300 Dysuria: Secondary | ICD-10-CM | POA: Diagnosis not present

## 2017-02-19 DIAGNOSIS — Z111 Encounter for screening for respiratory tuberculosis: Secondary | ICD-10-CM | POA: Diagnosis not present

## 2017-03-13 DIAGNOSIS — R509 Fever, unspecified: Secondary | ICD-10-CM | POA: Diagnosis not present

## 2017-03-13 DIAGNOSIS — J111 Influenza due to unidentified influenza virus with other respiratory manifestations: Secondary | ICD-10-CM | POA: Diagnosis not present

## 2017-06-10 DIAGNOSIS — M5441 Lumbago with sciatica, right side: Secondary | ICD-10-CM | POA: Diagnosis not present

## 2017-07-24 ENCOUNTER — Other Ambulatory Visit: Payer: Self-pay | Admitting: Family Medicine

## 2017-07-24 ENCOUNTER — Other Ambulatory Visit (HOSPITAL_COMMUNITY)
Admission: RE | Admit: 2017-07-24 | Discharge: 2017-07-24 | Disposition: A | Discharge status: Home / Self Care | Payer: BLUE CROSS/BLUE SHIELD | Source: Ambulatory Visit | Attending: Family Medicine | Admitting: Family Medicine

## 2017-07-24 DIAGNOSIS — Z01411 Encounter for gynecological examination (general) (routine) with abnormal findings: Secondary | ICD-10-CM | POA: Diagnosis not present

## 2017-07-24 DIAGNOSIS — Z124 Encounter for screening for malignant neoplasm of cervix: Secondary | ICD-10-CM | POA: Diagnosis not present

## 2017-07-24 DIAGNOSIS — N898 Other specified noninflammatory disorders of vagina: Secondary | ICD-10-CM | POA: Diagnosis not present

## 2017-07-24 DIAGNOSIS — R399 Unspecified symptoms and signs involving the genitourinary system: Secondary | ICD-10-CM | POA: Diagnosis not present

## 2017-07-24 DIAGNOSIS — R102 Pelvic and perineal pain: Secondary | ICD-10-CM

## 2017-07-25 LAB — CYTOLOGY - PAP
Adequacy: ABSENT
Diagnosis: NEGATIVE
HPV (WINDOPATH): NOT DETECTED

## 2017-07-30 DIAGNOSIS — N9489 Other specified conditions associated with female genital organs and menstrual cycle: Secondary | ICD-10-CM | POA: Diagnosis not present

## 2017-07-31 ENCOUNTER — Ambulatory Visit
Admission: RE | Admit: 2017-07-31 | Discharge: 2017-07-31 | Disposition: A | Discharge status: Home / Self Care | Payer: BLUE CROSS/BLUE SHIELD | Source: Ambulatory Visit | Attending: Family Medicine | Admitting: Family Medicine

## 2017-07-31 DIAGNOSIS — R102 Pelvic and perineal pain: Secondary | ICD-10-CM

## 2017-07-31 DIAGNOSIS — D259 Leiomyoma of uterus, unspecified: Secondary | ICD-10-CM | POA: Diagnosis not present

## 2017-08-01 ENCOUNTER — Other Ambulatory Visit: Payer: Self-pay

## 2017-09-09 DIAGNOSIS — M545 Low back pain: Secondary | ICD-10-CM | POA: Diagnosis not present

## 2017-09-09 DIAGNOSIS — Z6841 Body Mass Index (BMI) 40.0 and over, adult: Secondary | ICD-10-CM | POA: Diagnosis not present

## 2017-09-09 DIAGNOSIS — I10 Essential (primary) hypertension: Secondary | ICD-10-CM | POA: Diagnosis not present

## 2017-09-16 DIAGNOSIS — I10 Essential (primary) hypertension: Secondary | ICD-10-CM | POA: Diagnosis not present

## 2017-09-16 DIAGNOSIS — L680 Hirsutism: Secondary | ICD-10-CM | POA: Diagnosis not present

## 2017-09-26 DIAGNOSIS — R233 Spontaneous ecchymoses: Secondary | ICD-10-CM | POA: Diagnosis not present

## 2017-09-26 DIAGNOSIS — L821 Other seborrheic keratosis: Secondary | ICD-10-CM | POA: Diagnosis not present

## 2017-09-26 DIAGNOSIS — L578 Other skin changes due to chronic exposure to nonionizing radiation: Secondary | ICD-10-CM | POA: Diagnosis not present

## 2017-09-26 DIAGNOSIS — L820 Inflamed seborrheic keratosis: Secondary | ICD-10-CM | POA: Diagnosis not present

## 2017-10-02 DIAGNOSIS — M5127 Other intervertebral disc displacement, lumbosacral region: Secondary | ICD-10-CM | POA: Diagnosis not present

## 2017-10-23 DIAGNOSIS — R5382 Chronic fatigue, unspecified: Secondary | ICD-10-CM | POA: Diagnosis not present

## 2017-11-04 DIAGNOSIS — M5127 Other intervertebral disc displacement, lumbosacral region: Secondary | ICD-10-CM | POA: Diagnosis not present

## 2018-01-13 DIAGNOSIS — R05 Cough: Secondary | ICD-10-CM | POA: Diagnosis not present

## 2018-01-15 DIAGNOSIS — R05 Cough: Secondary | ICD-10-CM | POA: Diagnosis not present

## 2018-01-28 IMAGING — RF DG UGI W/ KUB
11 of 12 series · 12 of 24 positions shown · non-contrast
Comparison: 05/24/2013

CLINICAL DATA: Preoperative for sleeve gastrectomy.

EXAM:
UPPER GI SERIES WITH KUB
TECHNIQUE: After obtaining a scout radiograph a routine upper GI series was
performed using thin barium
FLUOROSCOPY TIME:  Radiation Exposure Index (as provided by the
fluoroscopic device): 29.4 mGy
If the device does not provide the exposure index:
Fluoroscopy Time (in minutes and seconds):
Number of Acquired Images:

[Series 2: cp_standard · 0.35mm/px · 1 of 48 frames shown (1 of 11)]
[frame 15/48]
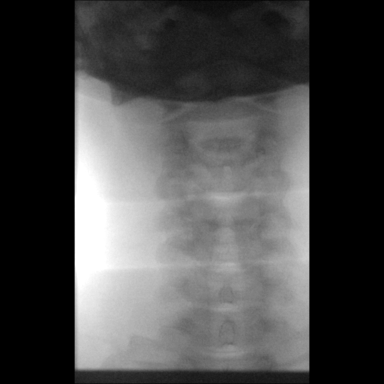

[Series 3: cp_standard · 0.35mm/px · 1 of 48 frames shown (2 of 11)]
[frame 25/48]
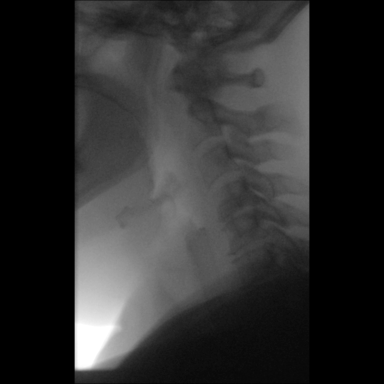

[Series 4: cp_standard · 0.34mm/px · 1 of 37 frames shown (3 of 11)]
[frame 32/37]
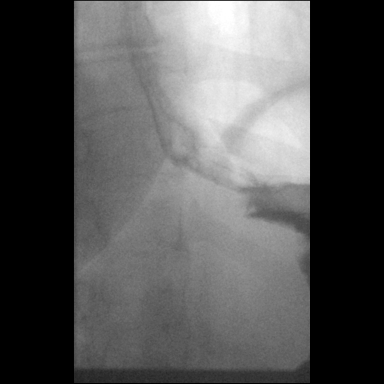

[Series 6: cp_standard · 0.37mm/px · 1 of 31 frames shown (4 of 11)]
[frame 5/31]
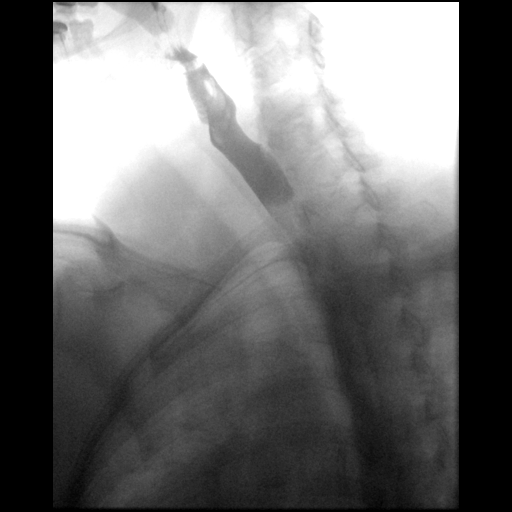

[Series 7: cp_standard · 0.37mm/px · 1 of 36 frames shown (5 of 11)]
[frame 6/36]
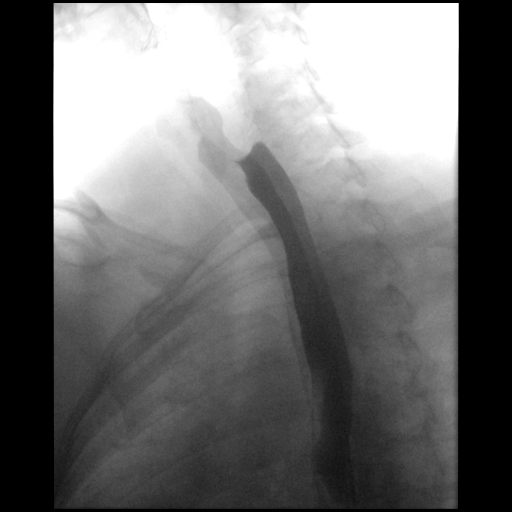

[Series 8: cp_standard · 0.37mm/px · 1 of 54 frames shown (6 of 11)]
[frame 9/54]
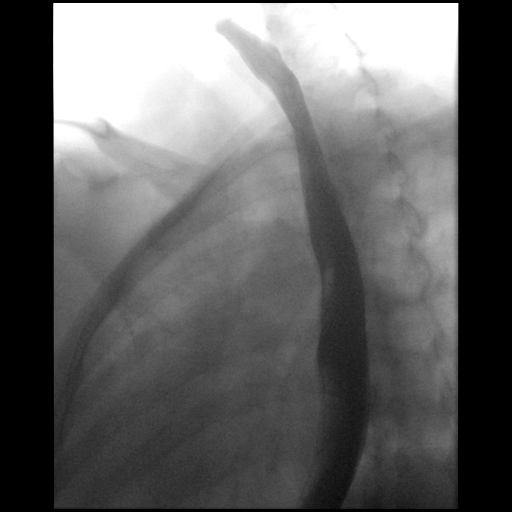

[Series 9: cp_standard · 0.37mm/px · 1 of 33 frames shown (7 of 11)]
[frame 5/33]
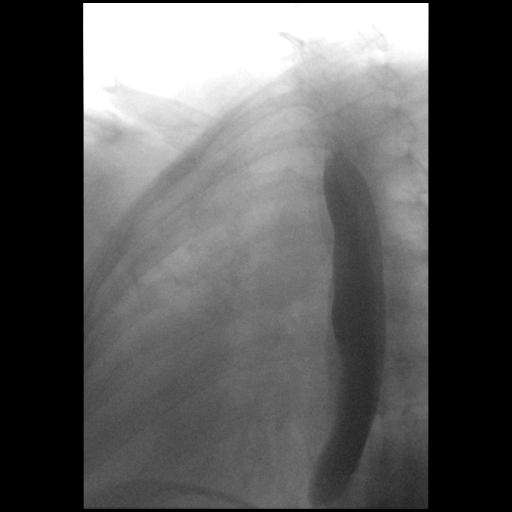

[Series 10: cp_standard · 0.37mm/px · 1 of 39 frames shown (8 of 11)]
[frame 3/39]
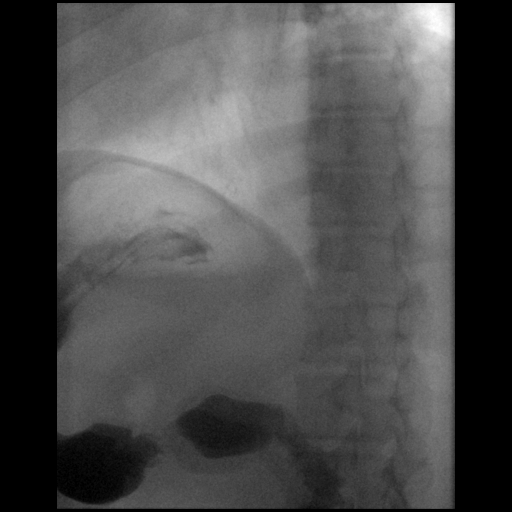

[Series 11: cp_standard · 0.37mm/px · 2 of 18 frames shown (9 of 11)]
[frame 3/18]
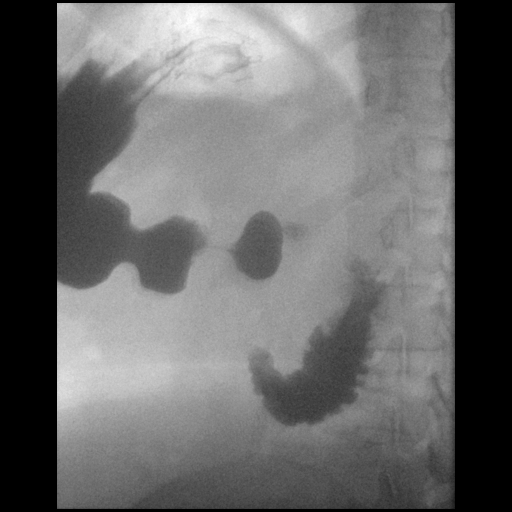
[frame 16/18]
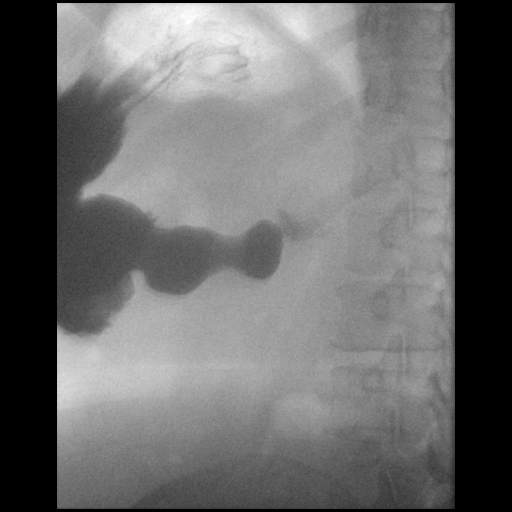

[Series 13: cp_standard · 0.36mm/px · 1 of 6 frames shown (10 of 11)]
[frame 6/6]
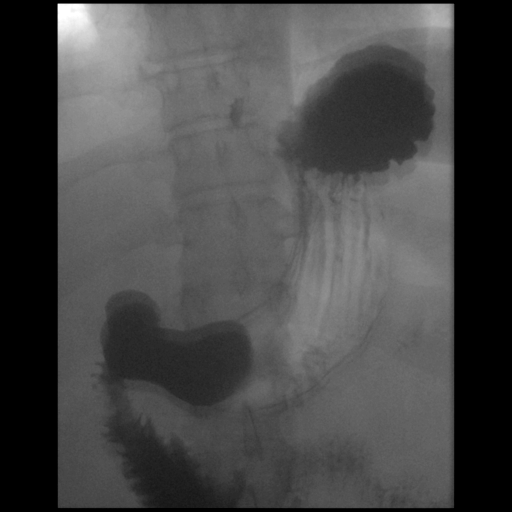

[Series 14: cp_standard · 0.36mm/px · 1 of 12 frames shown (11 of 11)]
[frame 11/12]
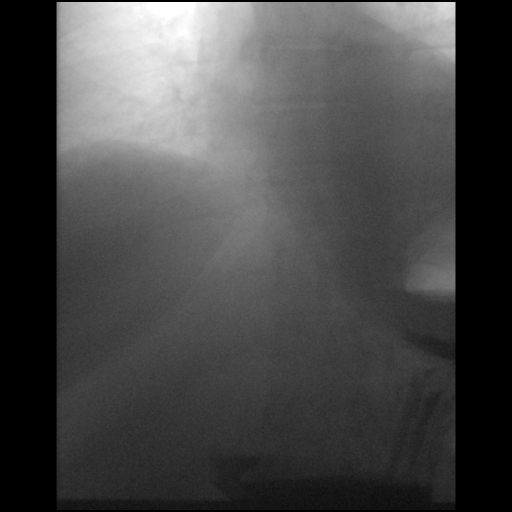

[12 of 24 positions shown; findings below may reference images not displayed]

FINDINGS: Initial KUB unremarkable.

Pharyngeal phase of swallowing normal.

There is mild disruption of primary peristaltic waves in the mid
thoracic is a esophagus on [DATE] swallows. Secondary swallows
allow for partial clearing of the esophagus.

A small hiatal hernia is visible on image 48/8. This was transient
and type 1.

Contraction of the duodenal bulb unremarkable without ulcer
identified. Expected duodenal configuration without findings of
malrotation. Mucosal relief assessment of the stomach normal.

A 13 mm barium tablet passed without difficulty into the stomach.
IMPRESSION: 1. Small intermittent type 1 hiatal hernia.
2. Mild esophageal dysmotility with partial disruption of primary
peristaltic waves in the mid esophagus on [DATE] swallows.
Appearance is nonspecific.

1.

## 2018-02-25 DIAGNOSIS — M5127 Other intervertebral disc displacement, lumbosacral region: Secondary | ICD-10-CM | POA: Diagnosis not present

## 2018-02-27 ENCOUNTER — Other Ambulatory Visit: Payer: Self-pay | Admitting: Anesthesiology

## 2018-02-27 DIAGNOSIS — M5127 Other intervertebral disc displacement, lumbosacral region: Secondary | ICD-10-CM

## 2018-03-01 ENCOUNTER — Ambulatory Visit
Admission: RE | Admit: 2018-03-01 | Discharge: 2018-03-01 | Disposition: A | Discharge status: Home / Self Care | Payer: BLUE CROSS/BLUE SHIELD | Source: Ambulatory Visit | Attending: Anesthesiology | Admitting: Anesthesiology

## 2018-03-01 DIAGNOSIS — M5127 Other intervertebral disc displacement, lumbosacral region: Secondary | ICD-10-CM | POA: Diagnosis not present

## 2018-03-01 DIAGNOSIS — M4807 Spinal stenosis, lumbosacral region: Secondary | ICD-10-CM | POA: Diagnosis not present

## 2018-03-07 DIAGNOSIS — M545 Low back pain: Secondary | ICD-10-CM | POA: Diagnosis not present

## 2018-03-17 DIAGNOSIS — M5432 Sciatica, left side: Secondary | ICD-10-CM | POA: Diagnosis not present

## 2018-03-17 DIAGNOSIS — M5431 Sciatica, right side: Secondary | ICD-10-CM | POA: Diagnosis not present

## 2018-04-14 DIAGNOSIS — R05 Cough: Secondary | ICD-10-CM | POA: Diagnosis not present

## 2018-07-16 DIAGNOSIS — M4316 Spondylolisthesis, lumbar region: Secondary | ICD-10-CM | POA: Diagnosis not present

## 2018-07-29 DIAGNOSIS — L821 Other seborrheic keratosis: Secondary | ICD-10-CM | POA: Diagnosis not present

## 2018-07-29 DIAGNOSIS — L918 Other hypertrophic disorders of the skin: Secondary | ICD-10-CM | POA: Diagnosis not present

## 2018-07-29 DIAGNOSIS — L820 Inflamed seborrheic keratosis: Secondary | ICD-10-CM | POA: Diagnosis not present

## 2018-07-29 DIAGNOSIS — L578 Other skin changes due to chronic exposure to nonionizing radiation: Secondary | ICD-10-CM | POA: Diagnosis not present

## 2018-10-03 DIAGNOSIS — F419 Anxiety disorder, unspecified: Secondary | ICD-10-CM | POA: Diagnosis not present

## 2018-10-03 DIAGNOSIS — F331 Major depressive disorder, recurrent, moderate: Secondary | ICD-10-CM | POA: Diagnosis not present

## 2018-10-24 DIAGNOSIS — R5382 Chronic fatigue, unspecified: Secondary | ICD-10-CM | POA: Diagnosis not present

## 2018-10-24 DIAGNOSIS — I10 Essential (primary) hypertension: Secondary | ICD-10-CM | POA: Diagnosis not present

## 2018-10-24 DIAGNOSIS — E538 Deficiency of other specified B group vitamins: Secondary | ICD-10-CM | POA: Diagnosis not present

## 2018-10-24 DIAGNOSIS — R739 Hyperglycemia, unspecified: Secondary | ICD-10-CM | POA: Diagnosis not present

## 2018-10-24 DIAGNOSIS — E611 Iron deficiency: Secondary | ICD-10-CM | POA: Diagnosis not present

## 2018-10-24 DIAGNOSIS — M722 Plantar fascial fibromatosis: Secondary | ICD-10-CM | POA: Diagnosis not present

## 2018-12-19 DIAGNOSIS — Z6841 Body Mass Index (BMI) 40.0 and over, adult: Secondary | ICD-10-CM | POA: Diagnosis not present

## 2019-01-06 DIAGNOSIS — R070 Pain in throat: Secondary | ICD-10-CM | POA: Diagnosis not present

## 2019-01-06 DIAGNOSIS — R05 Cough: Secondary | ICD-10-CM | POA: Diagnosis not present

## 2019-01-06 DIAGNOSIS — R509 Fever, unspecified: Secondary | ICD-10-CM | POA: Diagnosis not present

## 2019-01-06 DIAGNOSIS — J029 Acute pharyngitis, unspecified: Secondary | ICD-10-CM | POA: Diagnosis not present

## 2019-01-06 DIAGNOSIS — Z20828 Contact with and (suspected) exposure to other viral communicable diseases: Secondary | ICD-10-CM | POA: Diagnosis not present

## 2019-02-09 DIAGNOSIS — M48062 Spinal stenosis, lumbar region with neurogenic claudication: Secondary | ICD-10-CM | POA: Diagnosis not present

## 2019-03-30 DIAGNOSIS — F331 Major depressive disorder, recurrent, moderate: Secondary | ICD-10-CM | POA: Diagnosis not present

## 2019-03-30 DIAGNOSIS — M5431 Sciatica, right side: Secondary | ICD-10-CM | POA: Diagnosis not present

## 2019-04-06 DIAGNOSIS — M545 Low back pain: Secondary | ICD-10-CM | POA: Diagnosis not present

## 2019-04-09 DIAGNOSIS — D485 Neoplasm of uncertain behavior of skin: Secondary | ICD-10-CM | POA: Diagnosis not present

## 2019-04-09 DIAGNOSIS — L918 Other hypertrophic disorders of the skin: Secondary | ICD-10-CM | POA: Diagnosis not present

## 2019-04-09 DIAGNOSIS — D225 Melanocytic nevi of trunk: Secondary | ICD-10-CM | POA: Diagnosis not present

## 2019-04-09 DIAGNOSIS — L578 Other skin changes due to chronic exposure to nonionizing radiation: Secondary | ICD-10-CM | POA: Diagnosis not present

## 2019-04-16 DIAGNOSIS — M545 Low back pain: Secondary | ICD-10-CM | POA: Diagnosis not present

## 2019-04-21 DIAGNOSIS — Z20828 Contact with and (suspected) exposure to other viral communicable diseases: Secondary | ICD-10-CM | POA: Diagnosis not present

## 2019-04-24 DIAGNOSIS — M545 Low back pain: Secondary | ICD-10-CM | POA: Diagnosis not present

## 2019-04-24 DIAGNOSIS — M4696 Unspecified inflammatory spondylopathy, lumbar region: Secondary | ICD-10-CM | POA: Diagnosis not present

## 2019-05-01 DIAGNOSIS — M47816 Spondylosis without myelopathy or radiculopathy, lumbar region: Secondary | ICD-10-CM | POA: Diagnosis not present

## 2019-05-07 DIAGNOSIS — M47816 Spondylosis without myelopathy or radiculopathy, lumbar region: Secondary | ICD-10-CM | POA: Diagnosis not present

## 2019-06-15 ENCOUNTER — Other Ambulatory Visit: Payer: Self-pay | Admitting: Family Medicine

## 2019-06-15 DIAGNOSIS — Z1231 Encounter for screening mammogram for malignant neoplasm of breast: Secondary | ICD-10-CM

## 2019-06-22 DIAGNOSIS — M47816 Spondylosis without myelopathy or radiculopathy, lumbar region: Secondary | ICD-10-CM | POA: Diagnosis not present

## 2019-08-28 DIAGNOSIS — W460XXA Contact with hypodermic needle, initial encounter: Secondary | ICD-10-CM | POA: Diagnosis not present

## 2019-08-28 DIAGNOSIS — Z7721 Contact with and (suspected) exposure to potentially hazardous body fluids: Secondary | ICD-10-CM | POA: Diagnosis not present

## 2019-08-28 DIAGNOSIS — S61439A Puncture wound without foreign body of unspecified hand, initial encounter: Secondary | ICD-10-CM | POA: Diagnosis not present

## 2019-09-11 DIAGNOSIS — R0981 Nasal congestion: Secondary | ICD-10-CM | POA: Diagnosis not present

## 2019-09-11 DIAGNOSIS — J309 Allergic rhinitis, unspecified: Secondary | ICD-10-CM | POA: Diagnosis not present

## 2019-10-01 DIAGNOSIS — Z6841 Body Mass Index (BMI) 40.0 and over, adult: Secondary | ICD-10-CM | POA: Diagnosis not present

## 2019-10-01 DIAGNOSIS — M47816 Spondylosis without myelopathy or radiculopathy, lumbar region: Secondary | ICD-10-CM | POA: Diagnosis not present

## 2019-10-11 DIAGNOSIS — R05 Cough: Secondary | ICD-10-CM | POA: Diagnosis not present

## 2019-10-11 DIAGNOSIS — Z20828 Contact with and (suspected) exposure to other viral communicable diseases: Secondary | ICD-10-CM | POA: Diagnosis not present

## 2019-10-19 DIAGNOSIS — R5383 Other fatigue: Secondary | ICD-10-CM | POA: Diagnosis not present

## 2019-10-19 DIAGNOSIS — U071 COVID-19: Secondary | ICD-10-CM | POA: Diagnosis not present

## 2019-10-19 DIAGNOSIS — R110 Nausea: Secondary | ICD-10-CM | POA: Diagnosis not present

## 2019-10-21 ENCOUNTER — Inpatient Hospital Stay (HOSPITAL_COMMUNITY)
Admission: EM | Admit: 2019-10-21 | Discharge: 2019-10-29 | DRG: 177 | Disposition: A | Discharge status: Home / Self Care | Payer: BC Managed Care – PPO | Attending: Internal Medicine | Admitting: Internal Medicine

## 2019-10-21 ENCOUNTER — Emergency Department (HOSPITAL_COMMUNITY): Payer: BC Managed Care – PPO

## 2019-10-21 DIAGNOSIS — Z96641 Presence of right artificial hip joint: Secondary | ICD-10-CM | POA: Diagnosis not present

## 2019-10-21 DIAGNOSIS — Z8619 Personal history of other infectious and parasitic diseases: Secondary | ICD-10-CM

## 2019-10-21 DIAGNOSIS — Z6841 Body Mass Index (BMI) 40.0 and over, adult: Secondary | ICD-10-CM

## 2019-10-21 DIAGNOSIS — J1282 Pneumonia due to coronavirus disease 2019: Secondary | ICD-10-CM | POA: Diagnosis not present

## 2019-10-21 DIAGNOSIS — I10 Essential (primary) hypertension: Secondary | ICD-10-CM | POA: Diagnosis present

## 2019-10-21 DIAGNOSIS — N179 Acute kidney failure, unspecified: Secondary | ICD-10-CM

## 2019-10-21 DIAGNOSIS — T380X5A Adverse effect of glucocorticoids and synthetic analogues, initial encounter: Secondary | ICD-10-CM | POA: Diagnosis not present

## 2019-10-21 DIAGNOSIS — K219 Gastro-esophageal reflux disease without esophagitis: Secondary | ICD-10-CM | POA: Diagnosis not present

## 2019-10-21 DIAGNOSIS — R0600 Dyspnea, unspecified: Secondary | ICD-10-CM

## 2019-10-21 DIAGNOSIS — Z79899 Other long term (current) drug therapy: Secondary | ICD-10-CM

## 2019-10-21 DIAGNOSIS — G2581 Restless legs syndrome: Secondary | ICD-10-CM | POA: Diagnosis not present

## 2019-10-21 DIAGNOSIS — U071 COVID-19: Secondary | ICD-10-CM | POA: Diagnosis not present

## 2019-10-21 DIAGNOSIS — E1165 Type 2 diabetes mellitus with hyperglycemia: Secondary | ICD-10-CM | POA: Diagnosis not present

## 2019-10-21 DIAGNOSIS — I959 Hypotension, unspecified: Secondary | ICD-10-CM | POA: Diagnosis not present

## 2019-10-21 DIAGNOSIS — J9601 Acute respiratory failure with hypoxia: Secondary | ICD-10-CM

## 2019-10-21 DIAGNOSIS — Z8711 Personal history of peptic ulcer disease: Secondary | ICD-10-CM | POA: Diagnosis not present

## 2019-10-21 DIAGNOSIS — Z91048 Other nonmedicinal substance allergy status: Secondary | ICD-10-CM

## 2019-10-21 DIAGNOSIS — R0602 Shortness of breath: Secondary | ICD-10-CM | POA: Diagnosis not present

## 2019-10-21 DIAGNOSIS — R0902 Hypoxemia: Secondary | ICD-10-CM | POA: Diagnosis not present

## 2019-10-21 DIAGNOSIS — R05 Cough: Secondary | ICD-10-CM | POA: Diagnosis not present

## 2019-10-21 DIAGNOSIS — F329 Major depressive disorder, single episode, unspecified: Secondary | ICD-10-CM | POA: Diagnosis present

## 2019-10-21 DIAGNOSIS — E876 Hypokalemia: Secondary | ICD-10-CM

## 2019-10-21 DIAGNOSIS — J189 Pneumonia, unspecified organism: Secondary | ICD-10-CM | POA: Diagnosis not present

## 2019-10-21 LAB — CBC
HCT: 44.6 % (ref 36.0–46.0)
Hemoglobin: 14.5 g/dL (ref 12.0–15.0)
MCH: 29.8 pg (ref 26.0–34.0)
MCHC: 32.5 g/dL (ref 30.0–36.0)
MCV: 91.6 fL (ref 80.0–100.0)
Platelets: 218 10*3/uL (ref 150–400)
RBC: 4.87 MIL/uL (ref 3.87–5.11)
RDW: 12.6 % (ref 11.5–15.5)
WBC: 9.6 10*3/uL (ref 4.0–10.5)
nRBC: 0 % (ref 0.0–0.2)

## 2019-10-21 LAB — COMPREHENSIVE METABOLIC PANEL
ALT: 48 U/L — ABNORMAL HIGH (ref 0–44)
AST: 98 U/L — ABNORMAL HIGH (ref 15–41)
Albumin: 3 g/dL — ABNORMAL LOW (ref 3.5–5.0)
Alkaline Phosphatase: 59 U/L (ref 38–126)
Anion gap: 12 (ref 5–15)
BUN: 22 mg/dL — ABNORMAL HIGH (ref 6–20)
CO2: 31 mmol/L (ref 22–32)
Calcium: 8.8 mg/dL — ABNORMAL LOW (ref 8.9–10.3)
Chloride: 95 mmol/L — ABNORMAL LOW (ref 98–111)
Creatinine, Ser: 1.3 mg/dL — ABNORMAL HIGH (ref 0.44–1.00)
GFR calc Af Amer: 52 mL/min — ABNORMAL LOW (ref >60)
GFR calc non Af Amer: 45 mL/min — ABNORMAL LOW (ref >60)
Glucose, Bld: 114 mg/dL — ABNORMAL HIGH (ref 70–99)
Potassium: 3.2 mmol/L — ABNORMAL LOW (ref 3.5–5.1)
Sodium: 138 mmol/L (ref 135–145)
Total Bilirubin: 0.6 mg/dL (ref 0.3–1.2)
Total Protein: 6.1 g/dL — ABNORMAL LOW (ref 6.5–8.1)

## 2019-10-21 LAB — SARS CORONAVIRUS 2 BY RT PCR (HOSPITAL ORDER, PERFORMED IN ~~LOC~~ HOSPITAL LAB): SARS Coronavirus 2: POSITIVE — AB

## 2019-10-21 NOTE — ED Triage Notes (Signed)
Pt tested + for covid on Sunday, has been monitoring O2 at home, was reading in the low-mid 80s this afternoon. Pt's fingers cold, but initial room air SpO2 was 82%. Pt placed on 4L with improvement to 100%. Endorses nausea without vomiting. 500 NS bolus given by EMS.

## 2019-10-22 DIAGNOSIS — U071 COVID-19: Secondary | ICD-10-CM | POA: Diagnosis present

## 2019-10-22 DIAGNOSIS — N179 Acute kidney failure, unspecified: Secondary | ICD-10-CM | POA: Diagnosis present

## 2019-10-22 DIAGNOSIS — I10 Essential (primary) hypertension: Secondary | ICD-10-CM | POA: Diagnosis present

## 2019-10-22 DIAGNOSIS — E876 Hypokalemia: Secondary | ICD-10-CM

## 2019-10-22 DIAGNOSIS — Z8619 Personal history of other infectious and parasitic diseases: Secondary | ICD-10-CM | POA: Diagnosis not present

## 2019-10-22 DIAGNOSIS — J1282 Pneumonia due to coronavirus disease 2019: Secondary | ICD-10-CM | POA: Diagnosis present

## 2019-10-22 DIAGNOSIS — Z91048 Other nonmedicinal substance allergy status: Secondary | ICD-10-CM | POA: Diagnosis not present

## 2019-10-22 DIAGNOSIS — T380X5A Adverse effect of glucocorticoids and synthetic analogues, initial encounter: Secondary | ICD-10-CM | POA: Diagnosis not present

## 2019-10-22 DIAGNOSIS — Z96641 Presence of right artificial hip joint: Secondary | ICD-10-CM | POA: Diagnosis present

## 2019-10-22 DIAGNOSIS — J9601 Acute respiratory failure with hypoxia: Secondary | ICD-10-CM

## 2019-10-22 DIAGNOSIS — Z8711 Personal history of peptic ulcer disease: Secondary | ICD-10-CM | POA: Diagnosis not present

## 2019-10-22 DIAGNOSIS — K219 Gastro-esophageal reflux disease without esophagitis: Secondary | ICD-10-CM | POA: Diagnosis present

## 2019-10-22 DIAGNOSIS — F329 Major depressive disorder, single episode, unspecified: Secondary | ICD-10-CM | POA: Diagnosis present

## 2019-10-22 DIAGNOSIS — Z79899 Other long term (current) drug therapy: Secondary | ICD-10-CM | POA: Diagnosis not present

## 2019-10-22 DIAGNOSIS — Z6841 Body Mass Index (BMI) 40.0 and over, adult: Secondary | ICD-10-CM | POA: Diagnosis not present

## 2019-10-22 DIAGNOSIS — E1165 Type 2 diabetes mellitus with hyperglycemia: Secondary | ICD-10-CM | POA: Diagnosis not present

## 2019-10-22 DIAGNOSIS — G2581 Restless legs syndrome: Secondary | ICD-10-CM | POA: Diagnosis present

## 2019-10-22 LAB — HEMOGLOBIN A1C
Hgb A1c MFr Bld: 7.1 % — ABNORMAL HIGH (ref 4.8–5.6)
Mean Plasma Glucose: 157.07 mg/dL

## 2019-10-22 LAB — GLUCOSE, CAPILLARY
Glucose-Capillary: 159 mg/dL — ABNORMAL HIGH (ref 70–99)
Glucose-Capillary: 241 mg/dL — ABNORMAL HIGH (ref 70–99)
Glucose-Capillary: 174 mg/dL — ABNORMAL HIGH (ref 70–99)
Glucose-Capillary: 191 mg/dL — ABNORMAL HIGH (ref 70–99)

## 2019-10-22 LAB — MRSA PCR SCREENING: MRSA by PCR: POSITIVE — AB

## 2019-10-22 LAB — D-DIMER, QUANTITATIVE: D-Dimer, Quant: 0.44 ug/mL-FEU (ref 0.00–0.50)

## 2019-10-22 LAB — HIV ANTIBODY (ROUTINE TESTING W REFLEX): HIV Screen 4th Generation wRfx: NONREACTIVE

## 2019-10-22 LAB — PROCALCITONIN: Procalcitonin: <0.1 ng/mL

## 2019-10-22 LAB — C-REACTIVE PROTEIN: CRP: 3.9 mg/dL — ABNORMAL HIGH (ref <1.0)

## 2019-10-22 MED ORDER — DEXAMETHASONE SODIUM PHOSPHATE 10 MG/ML IJ SOLN
10.0000 mg | Freq: Once | INTRAMUSCULAR | Status: AC
  Administered 2019-10-22: 10 mg via INTRAVENOUS

## 2019-10-22 MED ORDER — HYDROCOD POLST-CPM POLST ER 10-8 MG/5ML PO SUER
5.0000 mL | Freq: Two times a day (BID) | ORAL | Status: DC | PRN
  Administered 2019-10-25 – 2019-10-27 (×2): 5 mL via ORAL
  Filled 2019-10-22: qty 5

## 2019-10-22 MED ORDER — METHYLPREDNISOLONE SODIUM SUCC 125 MG IJ SOLR
80.0000 mg | Freq: Two times a day (BID) | INTRAMUSCULAR | Status: AC
  Administered 2019-10-22 – 2019-10-25 (×6): 80 mg via INTRAVENOUS
  Filled 2019-10-22 (×7): qty 2

## 2019-10-22 MED ORDER — INSULIN ASPART 100 UNIT/ML ~~LOC~~ SOLN
0.0000 [IU] | Freq: Three times a day (TID) | SUBCUTANEOUS | Status: DC
  Administered 2019-10-22: 5 [IU] via SUBCUTANEOUS
  Administered 2019-10-22 (×2): 3 [IU] via SUBCUTANEOUS
  Administered 2019-10-23 (×3): 5 [IU] via SUBCUTANEOUS
  Administered 2019-10-24: 11 [IU] via SUBCUTANEOUS
  Administered 2019-10-24: 8 [IU] via SUBCUTANEOUS
  Administered 2019-10-24: 3 [IU] via SUBCUTANEOUS
  Administered 2019-10-25: 11 [IU] via SUBCUTANEOUS
  Administered 2019-10-25: 3 [IU] via SUBCUTANEOUS
  Administered 2019-10-25: 8 [IU] via SUBCUTANEOUS
  Administered 2019-10-26: 3 [IU] via SUBCUTANEOUS
  Administered 2019-10-26 (×2): 5 [IU] via SUBCUTANEOUS
  Administered 2019-10-27: 8 [IU] via SUBCUTANEOUS
  Administered 2019-10-27: 11 [IU] via SUBCUTANEOUS
  Administered 2019-10-27: 15 [IU] via SUBCUTANEOUS
  Administered 2019-10-28: 11 [IU] via SUBCUTANEOUS
  Administered 2019-10-28: 15 [IU] via SUBCUTANEOUS
  Administered 2019-10-28: 11 [IU] via SUBCUTANEOUS
  Administered 2019-10-29: 5 [IU] via SUBCUTANEOUS
  Administered 2019-10-29: 3 [IU] via SUBCUTANEOUS

## 2019-10-22 MED ORDER — GUAIFENESIN-DM 100-10 MG/5ML PO SYRP
10.0000 mL | ORAL_SOLUTION | ORAL | Status: DC | PRN
  Administered 2019-10-22 – 2019-10-28 (×3): 10 mL via ORAL
  Filled 2019-10-22 (×4): qty 10

## 2019-10-22 MED ORDER — DEXAMETHASONE SODIUM PHOSPHATE 10 MG/ML IJ SOLN
10.0000 mg | Freq: Once | INTRAMUSCULAR | Status: DC
  Administered 2019-10-22: 10 mg via INTRAVENOUS
  Filled 2019-10-22: qty 1

## 2019-10-22 MED ORDER — SODIUM CHLORIDE 0.9 % IV SOLN
100.0000 mg | INTRAVENOUS | Status: AC
  Administered 2019-10-22 (×2): 100 mg via INTRAVENOUS
  Filled 2019-10-22 (×2): qty 20

## 2019-10-22 MED ORDER — ONDANSETRON HCL 4 MG/2ML IJ SOLN
4.0000 mg | Freq: Four times a day (QID) | INTRAMUSCULAR | Status: DC | PRN
  Administered 2019-10-26: 4 mg via INTRAVENOUS
  Filled 2019-10-22: qty 2

## 2019-10-22 MED ORDER — METHYLPREDNISOLONE SODIUM SUCC 125 MG IJ SOLR: 0.5000 mg/kg | Freq: Two times a day (BID) | INTRAMUSCULAR | Status: DC

## 2019-10-22 MED ORDER — ONDANSETRON HCL 4 MG PO TABS: 4.0000 mg | ORAL_TABLET | Freq: Four times a day (QID) | ORAL | Status: DC | PRN

## 2019-10-22 MED ORDER — PREDNISONE 20 MG PO TABS: 50.0000 mg | ORAL_TABLET | Freq: Every day | ORAL | Status: DC

## 2019-10-22 MED ORDER — SODIUM CHLORIDE 0.9 % IV SOLN
100.0000 mg | Freq: Every day | INTRAVENOUS | Status: AC
  Administered 2019-10-23 – 2019-10-26 (×4): 100 mg via INTRAVENOUS
  Filled 2019-10-22 (×4): qty 20

## 2019-10-22 MED ORDER — INSULIN ASPART 100 UNIT/ML ~~LOC~~ SOLN
0.0000 [IU] | Freq: Every day | SUBCUTANEOUS | Status: DC
  Administered 2019-10-23: 2 [IU] via SUBCUTANEOUS
  Administered 2019-10-24: 3 [IU] via SUBCUTANEOUS
  Administered 2019-10-26 – 2019-10-27 (×2): 4 [IU] via SUBCUTANEOUS
  Administered 2019-10-28: 5 [IU] via SUBCUTANEOUS

## 2019-10-22 MED ORDER — POTASSIUM CHLORIDE CRYS ER 20 MEQ PO TBCR
40.0000 meq | EXTENDED_RELEASE_TABLET | Freq: Once | ORAL | Status: AC
  Administered 2019-10-22: 40 meq via ORAL
  Filled 2019-10-22: qty 2

## 2019-10-22 MED ORDER — ACETAMINOPHEN 325 MG PO TABS
650.0000 mg | ORAL_TABLET | Freq: Four times a day (QID) | ORAL | Status: DC | PRN
  Administered 2019-10-22: 650 mg via ORAL
  Filled 2019-10-22: qty 2

## 2019-10-22 MED ORDER — ENOXAPARIN SODIUM 40 MG/0.4ML ~~LOC~~ SOLN
40.0000 mg | Freq: Every day | SUBCUTANEOUS | Status: DC
  Administered 2019-10-22 – 2019-10-23 (×2): 40 mg via SUBCUTANEOUS
  Filled 2019-10-22 (×2): qty 0.4

## 2019-10-22 NOTE — ED Notes (Signed)
Pt 84% on RA. Pt placed on 2L [place], now 90%. Dr. Betsey Holiday notified

## 2019-10-22 NOTE — Progress Notes (Signed)
[facility] PROGRESS NOTE    Progress Note  Traci Roy  LGX:[id number] DOB: January 03, 1961 DOA: 10/21/2019 PCP: Maurice Small, MD     Brief Narrative:   Traci Roy is an 59 y.o. female past medical history significant for obesity essential hypertension who has not gotten vaccinated started having cough and congestion went and got herself tested 3 days prior to admission on 10/19/2019 and she tested positive for COVID-19 since then her breathing has gotten significantly worse especially with exertion.  Brought into the ED found hypoxic in the 70s and placed on 2 L and she stabilized.  Assessment/Plan:   Acute respiratory failure with hypoxia secondarily to Pneumonia due to COVID-19 virus She continues to require 3 L of oxygen to keep saturations greater than 92%. She was started on IV remdesivir and Decadron, will switch her to IV Solu-Medrol at a higher dose. Her inflammatory markers are pending this morning. Try to keep the patient prone for Lee 16 hours a day if not prone out of bed to chair, encouraged to use incentive spirometry and flutter valve.  When she is not prone in the bed please move her to the chair.    Essential hypertension: She is currently on no antihypertensive medication.   AKI (acute kidney injury) [facility]): Her baseline creatinine is less 1, she is on no antihypertensive medication she is taking ibuprofen at home with decreased oral intake likely prerenal azotemia we will start her on IV fluid hydration and recheck basic metabolic panel in the morning.  Hypokalemia: Replete orally recheck in the morning  DVT prophylaxis: lovenox Family Communication:none Status is: Inpatient  Remains inpatient appropriate because:Hemodynamically unstable   Dispo: The patient is from: Home              Anticipated d/c is to: Home              Anticipated d/c date is: > 3 days              Patient currently is not medically stable to d/c.        Code Status:       Code Status Orders  (From admission, onward)         Start     Ordered   10/22/19 0446  Full code  Continuous        10/22/19 0453        Code Status History    Date Active Date Inactive Code Status Order ID Comments User Context   04/14/2013 1207 04/16/2013 1913 Full Code [id number]  Rich Fuchs, PA-C Inpatient   Advance Care Planning Activity        IV Access:    Peripheral IV   Procedures and diagnostic studies:   DG Chest Portable 1 View  Result Date: 10/21/2019 CLINICAL DATA:  COVID, shortness of breath EXAM: PORTABLE CHEST 1 VIEW COMPARISON:  Chest x-ray 04/09/2013. FINDINGS: The heart size and mediastinal contours are within normal limits. Hazy patchy airspace opacities that are more prominent peripherally within the left mid lung zone and right mid upper lung. No pulmonary edema. No pleural effusion. No pneumothorax. No acute osseous abnormality. IMPRESSION: Findings consistent with COVID-19 infection. Electronically Signed   By: Iven Finn M.D.   On: 10/21/2019 17:27     Medical Consultants:    None.  Anti-Infectives:   Remdesivir  Subjective:    Traci Roy she relates her breathing is worse than yesterday.  Objective:    Vitals:  10/22/19 0500 10/22/19 0515 10/22/19 0528 10/22/19 0604  BP: (!) 110/59 111/62  122/73  Pulse: 82 81  81  Resp: 17 17  17   Temp:   99 F (37.2 C) 98.2 F (36.8 C)  TempSrc:   Oral   SpO2: 94% 95%  96%  Weight:   115.2 kg    SpO2: 96 % O2 Flow Rate (L/min): 3 L/min  No intake or output data in the 24 hours ending 10/22/19 0736 Filed Weights   10/22/19 0528  Weight: 115.2 kg    Exam: General exam: In no acute distress. Respiratory system: Good air movement with diffuse crackles bilaterally. Cardiovascular system: S1 & S2 heard, RRR. No JVD. Gastrointestinal system: Abdomen is nondistended, soft and nontender.  Extremities: No pedal edema. Skin: No rashes, lesions or ulcers  Data  Reviewed:    Labs: Basic Metabolic Panel: Recent Labs  Lab 10/21/19 1707  NA 138  K 3.2*  CL 95*  CO2 31  GLUCOSE 114*  BUN 22*  CREATININE 1.30*  CALCIUM 8.8*   GFR CrCl cannot be calculated (Unknown ideal weight.). Liver Function Tests: Recent Labs  Lab 10/21/19 1707  AST 98*  ALT 48*  ALKPHOS 59  BILITOT 0.6  PROT 6.1*  ALBUMIN 3.0*   No results for input(s): LIPASE, AMYLASE in the last 168 hours. No results for input(s): AMMONIA in the last 168 hours. Coagulation profile No results for input(s): INR, PROTIME in the last 168 hours. COVID-19 Labs  No results for input(s): DDIMER, FERRITIN, LDH, CRP in the last 72 hours.  Lab Results  Component Value Date   SARSCOV2NAA POSITIVE (A) 10/21/2019    CBC: Recent Labs  Lab 10/21/19 1707  WBC 9.6  HGB 14.5  HCT 44.6  MCV 91.6  PLT 218   Cardiac Enzymes: No results for input(s): CKTOTAL, CKMB, CKMBINDEX, TROPONINI in the last 168 hours. BNP (last 3 results) No results for input(s): PROBNP in the last 8760 hours. CBG: No results for input(s): GLUCAP in the last 168 hours. D-Dimer: No results for input(s): DDIMER in the last 72 hours. Hgb A1c: No results for input(s): HGBA1C in the last 72 hours. Lipid Profile: No results for input(s): CHOL, HDL, LDLCALC, TRIG, CHOLHDL, LDLDIRECT in the last 72 hours. Thyroid function studies: No results for input(s): TSH, T4TOTAL, T3FREE, THYROIDAB in the last 72 hours.  Invalid input(s): FREET3 Anemia work up: No results for input(s): VITAMINB12, FOLATE, FERRITIN, TIBC, IRON, RETICCTPCT in the last 72 hours. Sepsis Labs: Recent Labs  Lab 10/21/19 1707  WBC 9.6   Microbiology Recent Results (from the past 240 hour(s))  SARS Coronavirus 2 by RT PCR (hospital order, performed in [facility] hospital lab) Nasopharyngeal Nasopharyngeal Swab     Status: Abnormal   Collection Time: 10/21/19  5:07 PM   Specimen: Nasopharyngeal Swab  Result Value Ref Range Status    SARS Coronavirus 2 POSITIVE (A) NEGATIVE Final    Comment: RESULT CALLED TO, READ BACK BY AND VERIFIED WITH: A,OLEARY @1851  10/21/19 EB (NOTE) SARS-CoV-2 target nucleic acids are DETECTED  SARS-CoV-2 RNA is generally detectable in upper respiratory specimens  during the acute phase of infection.  Positive results are indicative  of the presence of the identified virus, but do not rule out bacterial infection or co-infection with other pathogens not detected by the test.  Clinical correlation with patient history and  other diagnostic information is necessary to determine patient infection status.  The expected result is negative.  Fact Sheet for  Patients:   StrictlyIdeas.no   Fact Sheet for Healthcare Providers:   BankingDealers.co.za    This test is not yet approved or cleared by the [place] FDA and  has been authorized for detection and/or diagnosis of SARS-CoV-2 by FDA under an Emergency Use Authorization (EUA).  This EUA will remain in effect (meaning this test can be  used) for the duration of  the COVID-19 declaration under Section 564(b)(1) of the Act, 21 U.S.C. section 360-bbb-3(b)(1), unless the authorization is terminated or revoked sooner.  Performed at [facility], [place] [address], [place], [place] [address]      Medications:    enoxaparin (LOVENOX) injection  40 mg Subcutaneous Q0600   insulin aspart  0-15 Units Subcutaneous TID WC   insulin aspart  0-5 Units Subcutaneous QHS   methylPREDNISolone (SOLU-MEDROL) injection  0.5 mg/kg Intravenous Q12H   Followed by   Derrill Memo ON 10/25/2019] predniSONE  50 mg Oral Daily   Continuous Infusions:  [START ON 10/23/2019] remdesivir 100 mg in NS 100 mL        LOS: 0 days   Charlynne Cousins  [facility]  10/22/2019, 7:36 AM

## 2019-10-22 NOTE — H&P (Signed)
History and Physical    Traci Roy QIH:[id number] DOB: [birth year] DOA: 10/21/2019  PCP: Maurice Small, MD  Patient coming from: Home  I have personally briefly reviewed patient's old medical records in [place]  Chief Complaint: COVID  HPI: Traci Roy is a 59 y.o. female with medical history significant of HTN and obesity.  Pt didn't get COVID vaccine.  Pt diagnosed with COVID-19 x3 days ago.  Having cough and congestion.  Breathing worsened today significantly with increased SOB.  She has been monitoring O2 sats at home, these are now going into the low 80s which prompted her to call her PCP who in turn told her to call EMS.   ED Course: Hypoxic to mid-upper 80s on RA.  Improved with 2L via [place].  Remdesivir and decadron ordered in ED.  WBC nl  Repeat COVID test is positive.  CXR shows "Findings consistent with COVID-19 infection".   Review of Systems: As per HPI, otherwise all review of systems negative.  Past Medical History:  Diagnosis Date  . Arthritis   . Depression    takes Lexapro daily  . Dizziness    occasionally and no meds  . Flu    end of Dec 2014  . GERD (gastroesophageal reflux disease)    occasionally and no meds required  . History of bronchitis    last time many yrs ago  . History of duodenal ulcer   . History of shingles 3-5 YRS AGO  . Hypertension    takes Losartan and HCTZ daily  . Joint pain   . Restless legs     Past Surgical History:  Procedure Laterality Date  . BREAST ENHANCEMENT SURGERY Bilateral   . CESAREAN SECTION     X 1  . COLONOSCOPY    . enlarged bladder as a child     HAD SURGERY TO ENLARGE BLADDER  . TONSILLECTOMY    . TOTAL HIP ARTHROPLASTY Right 04/14/2013   Procedure: TOTAL HIP ARTHROPLASTY ANTERIOR APPROACH;  Surgeon: Hessie Dibble, MD;  Location: [facility];  Service: Orthopedics;  Laterality: Right;     reports that she has never smoked. She has never used smokeless tobacco. She reports current  alcohol use. She reports that she does not use drugs.  Allergies  Allergen Reactions  . Adhesive [Tape] Itching and Rash    Itching and breaks out    No family history on file. No reported sick contacts.  Prior to Admission medications   Medication Sig Start Date End Date Taking? Authorizing Provider  escitalopram (LEXAPRO) 10 MG tablet Take 10 mg by mouth daily. Reported on 04/01/2015    [provider]  hydrochlorothiazide (HYDRODIURIL) 25 MG tablet Take 25 mg by mouth daily.    [provider]  ibuprofen (ADVIL,MOTRIN) 200 MG tablet Take 200-600 mg by mouth every 6 (six) hours as needed for headache or moderate pain.    [provider]  losartan (COZAAR) 50 MG tablet Take 50 mg by mouth daily.    [provider]  magnesium oxide (MAG-OX) 400 MG tablet Take 400-800 mg by mouth at bedtime as needed (restless legs).    [provider]  Multiple Vitamins-Minerals (MULTIVITAMIN WITH MINERALS) tablet Take 1 tablet by mouth daily.    [provider]    Physical Exam: Vitals:   10/22/19 0445 10/22/19 0500 10/22/19 0515 10/22/19 0528  BP: (!) 101/52 (!) 110/59 111/62   Pulse: 76 82 81   Resp: (!) 21 17  17   Temp:    99 F (37.2 C)  TempSrc:    Oral  SpO2: 93% 94% 95%     Constitutional: NAD, calm, comfortable Eyes: PERRL, lids and conjunctivae normal ENMT: Mucous membranes are moist. Posterior pharynx clear of any exudate or lesions.Normal dentition.  Neck: normal, supple, no masses, no thyromegaly Respiratory: clear to auscultation bilaterally, no wheezing, no crackles. Normal respiratory effort. No accessory muscle use.  Cardiovascular: Regular rate and rhythm, no murmurs / rubs / gallops. No extremity edema. 2+ pedal pulses. No carotid bruits.  Abdomen: no tenderness, no masses palpated. No hepatosplenomegaly. Bowel sounds positive.  Musculoskeletal: no clubbing / cyanosis. No joint deformity upper and lower extremities. Good  ROM, no contractures. Normal muscle tone.  Skin: no rashes, lesions, ulcers. No induration Neurologic: CN 2-12 grossly intact. Sensation intact, DTR normal. Strength 5/5 in all 4.  Psychiatric: Normal judgment and insight. Alert and oriented x 3. Normal mood.    Labs on Admission: I have personally reviewed following labs and imaging studies  CBC: Recent Labs  Lab 10/21/19 1707  WBC 9.6  HGB 14.5  HCT 44.6  MCV 91.6  PLT 341   Basic Metabolic Panel: Recent Labs  Lab 10/21/19 1707  NA 138  K 3.2*  CL 95*  CO2 31  GLUCOSE 114*  BUN 22*  CREATININE 1.30*  CALCIUM 8.8*   GFR: CrCl cannot be calculated (Unknown ideal weight.). Liver Function Tests: Recent Labs  Lab 10/21/19 1707  AST 98*  ALT 48*  ALKPHOS 59  BILITOT 0.6  PROT 6.1*  ALBUMIN 3.0*   No results for input(s): LIPASE, AMYLASE in the last 168 hours. No results for input(s): AMMONIA in the last 168 hours. Coagulation Profile: No results for input(s): INR, PROTIME in the last 168 hours. Cardiac Enzymes: No results for input(s): CKTOTAL, CKMB, CKMBINDEX, TROPONINI in the last 168 hours. BNP (last 3 results) No results for input(s): PROBNP in the last 8760 hours. HbA1C: No results for input(s): HGBA1C in the last 72 hours. CBG: No results for input(s): GLUCAP in the last 168 hours. Lipid Profile: No results for input(s): CHOL, HDL, LDLCALC, TRIG, CHOLHDL, LDLDIRECT in the last 72 hours. Thyroid Function Tests: No results for input(s): TSH, T4TOTAL, FREET4, T3FREE, THYROIDAB in the last 72 hours. Anemia Panel: No results for input(s): VITAMINB12, FOLATE, FERRITIN, TIBC, IRON, RETICCTPCT in the last 72 hours. Urine analysis:    Component Value Date/Time   COLORURINE YELLOW 05/24/2013 1456   APPEARANCEUR CLEAR 05/24/2013 1456   LABSPEC 1.017 05/24/2013 1456   PHURINE 7.0 05/24/2013 1456   GLUCOSEU NEGATIVE 05/24/2013 1456   HGBUR NEGATIVE 05/24/2013 1456   BILIRUBINUR NEGATIVE 05/24/2013 1456    KETONESUR NEGATIVE 05/24/2013 1456   PROTEINUR NEGATIVE 05/24/2013 1456   UROBILINOGEN 0.2 05/24/2013 1456   NITRITE NEGATIVE 05/24/2013 1456   LEUKOCYTESUR NEGATIVE 05/24/2013 1456    Radiological Exams on Admission: DG Chest Portable 1 View  Result Date: 10/21/2019 CLINICAL DATA:  COVID, shortness of breath EXAM: PORTABLE CHEST 1 VIEW COMPARISON:  Chest x-ray 04/09/2013. FINDINGS: The heart size and mediastinal contours are within normal limits. Hazy patchy airspace opacities that are more prominent peripherally within the left mid lung zone and right mid upper lung. No pulmonary edema. No pleural effusion. No pneumothorax. No acute osseous abnormality. IMPRESSION: Findings consistent with COVID-19 infection. Electronically Signed   By: Iven Finn M.D.   On: 10/21/2019 17:27    EKG: Independently reviewed.  Assessment/Plan Principal Problem:  Acute hypoxemic respiratory failure due to COVID-19 [facility]) Active Problems:   HTN (hypertension)    1. Acute hypoxic resp failure due to COVID-19 1. COVID pathway 2. remdesivir 3. Decadron in ED, will put on 0.5mg  solumedrol Q12H to start later today for 3 days followed by prednisone 4. Tele monitor 5. Cont pulse ox 6. O2 via [place] 7. CRP, procalcitonin, D.Dimer pending 8. Daily labs 2. HTN - 1. Cont home BP meds when med rec completed  DVT prophylaxis: Lovenox Code Status: Full Family Communication: no family in room Disposition Plan: Home after O2 requirement improves Consults called: None Admission status: Admit to inpatient  Severity of Illness: The appropriate patient status for this patient is INPATIENT. Inpatient status is judged to be reasonable and necessary in order to provide the required intensity of service to ensure the patient's safety. The patient's presenting symptoms, physical exam findings, and initial radiographic and laboratory data in the context of their chronic comorbidities is felt to place them at high risk  for further clinical deterioration. Furthermore, it is not anticipated that the patient will be medically stable for discharge from the hospital within 2 midnights of admission. The following factors support the patient status of inpatient.   IP status due to new O2 requirement from COVID-19.   * I certify that at the point of admission it is my clinical judgment that the patient will require inpatient hospital care spanning beyond 2 midnights from the point of admission due to high intensity of service, high risk for further deterioration and high frequency of surveillance required.*    Marchelle Rinella M. DO [facility]  How to contact the [facility] Attending or Consulting provider [place] or covering provider during after hours [place], for this patient?  1. Check the care team in [facility] and look for a) attending/consulting [facility] provider listed and b) the [facility] team listed 2. Log into [email or web address]  Amion Physician Scheduling and messaging for groups and whole hospitals  On call and physician scheduling software for group practices, residents, hospitalists and other medical providers for call, clinic, rotation and shift schedules. On[employer] is a hospital-wide system for scheduling doctors and paging doctors on call. EasyPlot is for scientific plotting and data analysis.  [email or web address]  and use 's universal password to access. If you do not have the password, please contact the hospital operator.  3. Locate the [facility] provider you are looking for under [facility] and page to a number that you can be directly reached. 4. If you still have difficulty reaching the provider, please page the [facility] (Director on Call) for the Hospitalists listed on amion for assistance.  10/22/2019, 5:42 AM

## 2019-10-22 NOTE — ED Provider Notes (Signed)
[facility] EMERGENCY DEPARTMENT Provider Note   CSN: [id number] Arrival date & time: 10/21/19  1655     History No chief complaint on file.   Traci Roy is a 59 y.o. female.  Patient presents to the emergency department for evaluation of difficulty breathing.  Patient tested positive for Covid 3 days ago.  She has been experiencing cough and chest congestion.  Patient reports that her breathing worsened today.  Cough has not significantly worsened.  She has been monitoring her oxygen saturations at home.  Oxygen saturations dropped into the mid to low 80s range.  She called her doctor and was told to call EMS to be reevaluated and brought to the ED.        Past Medical History:  Diagnosis Date  . Arthritis   . Depression    takes Lexapro daily  . Dizziness    occasionally and no meds  . Flu    end of Dec 2014  . GERD (gastroesophageal reflux disease)    occasionally and no meds required  . History of bronchitis    last time many yrs ago  . History of duodenal ulcer   . History of shingles 3-5 YRS AGO  . Hypertension    takes Losartan and HCTZ daily  . Joint pain   . Restless legs     Patient Active Problem List   Diagnosis Date Noted  . Degenerative joint disease (DJD) of hip 04/14/2013    Past Surgical History:  Procedure Laterality Date  . BREAST ENHANCEMENT SURGERY Bilateral   . CESAREAN SECTION     X 1  . COLONOSCOPY    . enlarged bladder as a child     HAD SURGERY TO ENLARGE BLADDER  . TONSILLECTOMY    . TOTAL HIP ARTHROPLASTY Right 04/14/2013   Procedure: TOTAL HIP ARTHROPLASTY ANTERIOR APPROACH;  Surgeon: Hessie Dibble, MD;  Location: [facility];  Service: Orthopedics;  Laterality: Right;     OB History   No obstetric history on file.     No family history on file.  Social History   Tobacco Use  . Smoking status: Never Smoker  . Smokeless tobacco: Never Used  Substance Use Topics  . Alcohol use: Yes    Comment:  glass of wine every now and then  . Drug use: No    Home Medications Prior to Admission medications   Medication Sig Start Date End Date Taking? Authorizing Provider  escitalopram (LEXAPRO) 10 MG tablet Take 10 mg by mouth daily. Reported on 04/01/2015    [provider]  hydrochlorothiazide (HYDRODIURIL) 25 MG tablet Take 25 mg by mouth daily.    [provider]  ibuprofen (ADVIL,MOTRIN) 200 MG tablet Take 200-600 mg by mouth every 6 (six) hours as needed for headache or moderate pain.    [provider]  losartan (COZAAR) 50 MG tablet Take 50 mg by mouth daily.    [provider]  magnesium oxide (MAG-OX) 400 MG tablet Take 400-800 mg by mouth at bedtime as needed (restless legs).    [provider]  Multiple Vitamins-Minerals (MULTIVITAMIN WITH MINERALS) tablet Take 1 tablet by mouth daily.    [provider]    Allergies    Adhesive [tape]  Review of Systems   Review of Systems  Respiratory: Positive for cough and shortness of breath.   All other systems reviewed and are negative.   Physical Exam Updated Vital Signs BP 127/72   Pulse  73   Temp 98.1 F (36.7 C) (Oral)   Resp (!) 21   LMP 02/05/2014   SpO2 95%   Physical Exam Vitals and nursing note reviewed.  Constitutional:      General: She is not in acute distress.    Appearance: Normal appearance. She is well-developed.  HENT:     Head: Normocephalic and atraumatic.     Right Ear: Hearing normal.     Left Ear: Hearing normal.     Nose: Nose normal.  Eyes:     Conjunctiva/sclera: Conjunctivae normal.     Pupils: Pupils are equal, round, and reactive to light.  Cardiovascular:     Rate and Rhythm: Regular rhythm.     Heart sounds: S1 normal and S2 normal. No murmur heard.  No friction rub. No gallop.   Pulmonary:     Effort: Pulmonary effort is normal. No respiratory distress.     Breath sounds: Normal breath sounds.  Chest:     Chest wall: No tenderness.   Abdominal:     General: Bowel sounds are normal.     Palpations: Abdomen is soft.     Tenderness: There is no abdominal tenderness. There is no guarding or rebound. Negative signs include Murphy's sign and McBurney's sign.     Hernia: No hernia is present.  Musculoskeletal:        General: Normal range of motion.     Cervical back: Normal range of motion and neck supple.  Skin:    General: Skin is warm and dry.     Findings: No rash.  Neurological:     Mental Status: She is alert and oriented to person, place, and time.     GCS: GCS eye subscore is 4. GCS verbal subscore is 5. GCS motor subscore is 6.     Cranial Nerves: No cranial nerve deficit.     Sensory: No sensory deficit.     Coordination: Coordination normal.  Psychiatric:        Speech: Speech normal.        Behavior: Behavior normal.        Thought Content: Thought content normal.     ED Results / Procedures / Treatments   Labs (all labs ordered are listed, but only abnormal results are displayed) Labs Reviewed  SARS CORONAVIRUS 2 BY RT PCR (HOSPITAL ORDER, [facility]) - Abnormal; Notable for the following components:      Result Value   SARS Coronavirus 2 POSITIVE (*)    All other components within normal limits  COMPREHENSIVE METABOLIC PANEL - Abnormal; Notable for the following components:   Potassium 3.2 (*)    Chloride 95 (*)    Glucose, Bld 114 (*)    BUN 22 (*)    Creatinine, Ser 1.30 (*)    Calcium 8.8 (*)    Total Protein 6.1 (*)    Albumin 3.0 (*)    AST 98 (*)    ALT 48 (*)    GFR calc non Af Amer 45 (*)    GFR calc Af Amer 52 (*)    All other components within normal limits  CBC    EKG EKG Interpretation  Date/Time:  Wednesday October 21 2019 16:59:08 EDT Ventricular Rate:  73 PR Interval:  134 QRS Duration: 84 QT Interval:  380 QTC Calculation: 418 R Axis:   -17 Text Interpretation: Normal sinus rhythm Low voltage QRS Cannot rule out Anterior infarct ,  age undetermined Abnormal ECG  Confirmed by Orpah Greek [phone]) on 10/22/2019 4:05:54 AM   Radiology DG Chest Portable 1 View  Result Date: 10/21/2019 CLINICAL DATA:  COVID, shortness of breath EXAM: PORTABLE CHEST 1 VIEW COMPARISON:  Chest x-ray 04/09/2013. FINDINGS: The heart size and mediastinal contours are within normal limits. Hazy patchy airspace opacities that are more prominent peripherally within the left mid lung zone and right mid upper lung. No pulmonary edema. No pleural effusion. No pneumothorax. No acute osseous abnormality. IMPRESSION: Findings consistent with COVID-19 infection. Electronically Signed   By: Iven Finn M.D.   On: 10/21/2019 17:27    Procedures Procedures (including critical care time)  Medications Ordered in ED Medications  dexamethasone (DECADRON) injection 10 mg (has no administration in time range)  remdesivir 100 mg in sodium chloride 0.9 % 100 mL IVPB (has no administration in time range)  remdesivir 100 mg in sodium chloride 0.9 % 100 mL IVPB (has no administration in time range)    ED Course  I have reviewed the triage vital signs and the nursing notes.  Pertinent labs & imaging results that were available during my care of the patient were reviewed by me and considered in my medical decision making (see chart for details).    MDM Rules/Calculators/A&P                          Patient presents to the emergency department for evaluation of increasing shortness of breath in the setting of Covid infection.  Patient does not appear septic.  Heart rate, respiratory rate and blood pressure are normal.  Chest x-ray shows evidence of Covid pneumonia.  Patient does not have any chronic lung illness.  She drops into the low 80% range off of oxygen with minimal exertion, therefore has a new oxygen requirement.  She will require hospitalization.  Initiate remdesivir and Decadron.  Final Clinical Impression(s) / ED Diagnoses Final diagnoses:    Pneumonia due to COVID-19 virus    Rx / DC Orders ED Discharge Orders    None       Rainee Sweatt, Gwenyth Allegra, MD 10/22/19 [phone]

## 2019-10-23 DIAGNOSIS — N179 Acute kidney failure, unspecified: Secondary | ICD-10-CM

## 2019-10-23 LAB — COMPREHENSIVE METABOLIC PANEL
ALT: 70 U/L — ABNORMAL HIGH (ref 0–44)
AST: 83 U/L — ABNORMAL HIGH (ref 15–41)
Albumin: 2.9 g/dL — ABNORMAL LOW (ref 3.5–5.0)
Alkaline Phosphatase: 58 U/L (ref 38–126)
Anion gap: 9 (ref 5–15)
BUN: 21 mg/dL — ABNORMAL HIGH (ref 6–20)
CO2: 30 mmol/L (ref 22–32)
Calcium: 9.2 mg/dL (ref 8.9–10.3)
Chloride: 98 mmol/L (ref 98–111)
Creatinine, Ser: 1.04 mg/dL — ABNORMAL HIGH (ref 0.44–1.00)
GFR calc Af Amer: >60 mL/min (ref >60)
GFR calc non Af Amer: 59 mL/min — ABNORMAL LOW (ref >60)
Glucose, Bld: 204 mg/dL — ABNORMAL HIGH (ref 70–99)
Potassium: 3.6 mmol/L (ref 3.5–5.1)
Sodium: 137 mmol/L (ref 135–145)
Total Bilirubin: 0.6 mg/dL (ref 0.3–1.2)
Total Protein: 6.1 g/dL — ABNORMAL LOW (ref 6.5–8.1)

## 2019-10-23 LAB — CBC WITH DIFFERENTIAL/PLATELET
Abs Immature Granulocytes: 0.04 10*3/uL (ref 0.00–0.07)
Basophils Absolute: 0 10*3/uL (ref 0.0–0.1)
Basophils Relative: 0 %
Eosinophils Absolute: 0 10*3/uL (ref 0.0–0.5)
Eosinophils Relative: 0 %
HCT: 41.8 % (ref 36.0–46.0)
Hemoglobin: 13.8 g/dL (ref 12.0–15.0)
Immature Granulocytes: 1 %
Lymphocytes Relative: 16 %
Lymphs Abs: 0.7 10*3/uL (ref 0.7–4.0)
MCH: 30.4 pg (ref 26.0–34.0)
MCHC: 33 g/dL (ref 30.0–36.0)
MCV: 92.1 fL (ref 80.0–100.0)
Monocytes Absolute: 0.1 10*3/uL (ref 0.1–1.0)
Monocytes Relative: 3 %
Neutro Abs: 3.5 10*3/uL (ref 1.7–7.7)
Neutrophils Relative %: 80 %
Platelets: 231 10*3/uL (ref 150–400)
RBC: 4.54 MIL/uL (ref 3.87–5.11)
RDW: 12.5 % (ref 11.5–15.5)
WBC: 4.3 10*3/uL (ref 4.0–10.5)
nRBC: 0 % (ref 0.0–0.2)

## 2019-10-23 LAB — GLUCOSE, CAPILLARY
Glucose-Capillary: 217 mg/dL — ABNORMAL HIGH (ref 70–99)
Glucose-Capillary: 237 mg/dL — ABNORMAL HIGH (ref 70–99)
Glucose-Capillary: 250 mg/dL — ABNORMAL HIGH (ref 70–99)
Glucose-Capillary: 241 mg/dL — ABNORMAL HIGH (ref 70–99)

## 2019-10-23 LAB — D-DIMER, QUANTITATIVE: D-Dimer, Quant: 0.41 ug/mL-FEU (ref 0.00–0.50)

## 2019-10-23 LAB — C-REACTIVE PROTEIN: CRP: 4.1 mg/dL — ABNORMAL HIGH (ref <1.0)

## 2019-10-23 MED ORDER — PREDNISONE 20 MG PO TABS
50.0000 mg | ORAL_TABLET | Freq: Every day | ORAL | Status: DC
  Administered 2019-10-26: 50 mg via ORAL
  Filled 2019-10-23 (×2): qty 2

## 2019-10-23 MED ORDER — ESCITALOPRAM OXALATE 20 MG PO TABS
20.0000 mg | ORAL_TABLET | Freq: Every day | ORAL | Status: DC
  Administered 2019-10-23 – 2019-10-29 (×7): 20 mg via ORAL
  Filled 2019-10-23 (×7): qty 1

## 2019-10-23 MED ORDER — ENOXAPARIN SODIUM 60 MG/0.6ML ~~LOC~~ SOLN
55.0000 mg | Freq: Every day | SUBCUTANEOUS | Status: DC
  Administered 2019-10-24 – 2019-10-29 (×6): 55 mg via SUBCUTANEOUS
  Filled 2019-10-23 (×6): qty 0.6

## 2019-10-23 NOTE — Progress Notes (Signed)
PROGRESS NOTE  Traci Roy:[id number] DOB: [birth year] DOA: 10/21/2019 PCP: Traci Small, MD   LOS: 1 day   Brief Narrative / Interim history: Traci Roy is an 59 y.o. female past medical history significant for obesity essential hypertension who has not gotten vaccinated started having cough and congestion went and got herself tested 3 days prior to admission on 10/19/2019 and she tested positive for COVID-19 since then her breathing has gotten significantly worse especially with exertion.  Brought into the ED found hypoxic in the 70s and placed on 2 L and she stabilized.  Subjective / 24h Interval events: She states that she is feeling better this morning, appreciates that her breathing is improved.  Denies any chest pain, no abdominal pain, no nausea or vomiting.  Asking to go home  Assessment & Plan:  Principal Problem Acute Hypoxic Respiratory Failure due to Covid-19 Viral Illness -Remains hypoxic this morning with ongoing 2 L nasal cannula. -She was started on remdesivir, scheduled to be done on 9/20 on Monday.  If she improves this can be finished as an outpatient -Continue Solu-Medrol -Incentive spirometry, encourage being prone I-S, flutter valve   COVID-19 Labs  Recent Labs    10/22/19 1006 10/23/19 0040  DDIMER 0.44 0.41  CRP 3.9* 4.1*    Lab Results  Component Value Date   SARSCOV2NAA POSITIVE (A) 10/21/2019   Active Problems Essential hypertension-not sure if he truly has hypertension as her blood pressure is normal and she is not on antihypertensives at home  Acute kidney injury -creatinine has now normalized  Morbid obesity-BMI 43 -patient will benefit from weight loss  Diabetes mellitus with steroid-induced hyperglycemia-continue sliding scale while here.  Her A1c is 7.1  Scheduled Meds: . enoxaparin (LOVENOX) injection  40 mg Subcutaneous Q0600  . insulin aspart  0-15 Units Subcutaneous TID WC  . insulin aspart  0-5 Units Subcutaneous QHS  .  methylPREDNISolone (SOLU-MEDROL) injection  80 mg Intravenous Q12H   Continuous Infusions: . remdesivir 100 mg in NS 100 mL 100 mg (10/23/19 1036)   PRN Meds:.acetaminophen, chlorpheniramine-HYDROcodone, guaiFENesin-dextromethorphan, ondansetron **OR** ondansetron (ZOFRAN) IV  DVT prophylaxis: Lovenox Code Status: Full code Family Communication: d/w husband over the phone   Status is: Inpatient  Remains inpatient appropriate because:IV treatments appropriate due to intensity of illness or inability to take PO Dispo: The patient is from: Home              Anticipated d/c is to: Home              Anticipated d/c date is: 2 days              Patient currently is not medically stable to d/c.  Consultants:  None   Procedures:  None   Microbiology: None   Antibacterials: None    Objective: Vitals:   10/22/19 1658 10/22/19 2104 10/23/19 0850 10/23/19 1113  BP: 104/68 133/74 113/61   Pulse: 78 78 70   Resp: 19 20 20    Temp: 98 F (36.7 C) 98.2 F (36.8 C) (!) 97.5 F (36.4 C)   TempSrc:  Oral Oral   SpO2: 94% 94% 91%   Weight:      Height:    5\' 4"  (1.626 m)   No intake or output data in the 24 hours ending 10/23/19 1146 Filed Weights   10/22/19 0528  Weight: 115.2 kg    Examination:  Constitutional: NAD Eyes: no scleral icterus ENMT: Mucous membranes are moist.  Neck: normal,  supple Respiratory: Faint bibasilar rhonchi, no wheezing, no crackles Cardiovascular: Regular rate and rhythm, no murmurs / rubs / gallops. No LE edema. Abdomen: non distended, no tenderness. Bowel sounds positive.  Musculoskeletal: no clubbing / cyanosis.  Skin: no rashes Neurologic: CN 2-12 grossly intact. Strength 5/5 in all 4.   Data Reviewed: I have independently reviewed following labs and imaging studies   CBC: Recent Labs  Lab 10/21/19 1707 10/23/19 0040  WBC 9.6 4.3  NEUTROABS  --  3.5  HGB 14.5 13.8  HCT 44.6 41.8  MCV 91.6 92.1  PLT 218 846   Basic Metabolic  Panel: Recent Labs  Lab 10/21/19 1707 10/23/19 0040  NA 138 137  K 3.2* 3.6  CL 95* 98  CO2 31 30  GLUCOSE 114* 204*  BUN 22* 21*  CREATININE 1.30* 1.04*  CALCIUM 8.8* 9.2   GFR: Estimated Creatinine Clearance: 72.5 mL/min (A) (by C-G formula based on SCr of 1.04 mg/dL (H)). Liver Function Tests: Recent Labs  Lab 10/21/19 1707 10/23/19 0040  AST 98* 83*  ALT 48* 70*  ALKPHOS 59 58  BILITOT 0.6 0.6  PROT 6.1* 6.1*  ALBUMIN 3.0* 2.9*   No results for input(s): LIPASE, AMYLASE in the last 168 hours. No results for input(s): AMMONIA in the last 168 hours. Coagulation Profile: No results for input(s): INR, PROTIME in the last 168 hours. Cardiac Enzymes: No results for input(s): CKTOTAL, CKMB, CKMBINDEX, TROPONINI in the last 168 hours. BNP (last 3 results) No results for input(s): PROBNP in the last 8760 hours. HbA1C: Recent Labs    10/22/19 1006  HGBA1C 7.1*   CBG: Recent Labs  Lab 10/22/19 0747 10/22/19 1142 10/22/19 1624 10/22/19 2103 10/23/19 0852  GLUCAP 159* 241* 174* 191* 217*   Lipid Profile: No results for input(s): CHOL, HDL, LDLCALC, TRIG, CHOLHDL, LDLDIRECT in the last 72 hours. Thyroid Function Tests: No results for input(s): TSH, T4TOTAL, FREET4, T3FREE, THYROIDAB in the last 72 hours. Anemia Panel: No results for input(s): VITAMINB12, FOLATE, FERRITIN, TIBC, IRON, RETICCTPCT in the last 72 hours. Urine analysis:    Component Value Date/Time   COLORURINE YELLOW 05/24/2013 1456   APPEARANCEUR CLEAR 05/24/2013 1456   LABSPEC 1.017 05/24/2013 1456   PHURINE 7.0 05/24/2013 1456   GLUCOSEU NEGATIVE 05/24/2013 1456   HGBUR NEGATIVE 05/24/2013 1456   BILIRUBINUR NEGATIVE 05/24/2013 1456   KETONESUR NEGATIVE 05/24/2013 1456   PROTEINUR NEGATIVE 05/24/2013 1456   UROBILINOGEN 0.2 05/24/2013 1456   NITRITE NEGATIVE 05/24/2013 1456   LEUKOCYTESUR NEGATIVE 05/24/2013 1456   Sepsis Labs: Invalid input(s): PROCALCITONIN, LACTICIDVEN  Recent  Results (from the past 240 hour(s))  SARS Coronavirus 2 by RT PCR (hospital order, performed in [facility] hospital lab) Nasopharyngeal Nasopharyngeal Swab     Status: Abnormal   Collection Time: 10/21/19  5:07 PM   Specimen: Nasopharyngeal Swab  Result Value Ref Range Status   SARS Coronavirus 2 POSITIVE (A) NEGATIVE Final    Comment: RESULT CALLED TO, READ BACK BY AND VERIFIED WITH: A,OLEARY @1851  10/21/19 EB (NOTE) SARS-CoV-2 target nucleic acids are DETECTED  SARS-CoV-2 RNA is generally detectable in upper respiratory specimens  during the acute phase of infection.  Positive results are indicative  of the presence of the identified virus, but do not rule out bacterial infection or co-infection with other pathogens not detected by the test.  Clinical correlation with patient history and  other diagnostic information is necessary to determine patient infection status.  The expected result is negative.  Fact  Sheet for Patients:   StrictlyIdeas.no   Fact Sheet for Healthcare Providers:   BankingDealers.co.za    This test is not yet approved or cleared by the [place] FDA and  has been authorized for detection and/or diagnosis of SARS-CoV-2 by FDA under an Emergency Use Authorization (EUA).  This EUA will remain in effect (meaning this test can be  used) for the duration of  the COVID-19 declaration under Section 564(b)(1) of the Act, 21 U.S.C. section 360-bbb-3(b)(1), unless the authorization is terminated or revoked sooner.  Performed at [facility], [place] [address] [place], [place] [address]   MRSA PCR Screening     Status: Abnormal   Collection Time: 10/22/19 11:11 AM   Specimen: Nasal Mucosa; Nasopharyngeal  Result Value Ref Range Status   MRSA by PCR POSITIVE (A) NEGATIVE Final    Comment:        The GeneXpert MRSA Assay (FDA approved for NASAL specimens only), is one component of a comprehensive MRSA  colonization surveillance program. It is not intended to diagnose MRSA infection nor to guide or monitor treatment for MRSA infections. CRITICAL RESULT CALLED TO, READ BACK BY AND VERIFIED WITH: RN Paulla Fore AT 6301 10/22/19 BY MM Performed at [facility], [place] [address] [place], [place] [address]       Radiology Studies: DG Chest Portable 1 View  Result Date: 10/21/2019 CLINICAL DATA:  COVID, shortness of breath EXAM: PORTABLE CHEST 1 VIEW COMPARISON:  Chest x-ray 04/09/2013. FINDINGS: The heart size and mediastinal contours are within normal limits. Hazy patchy airspace opacities that are more prominent peripherally within the left mid lung zone and right mid upper lung. No pulmonary edema. No pleural effusion. No pneumothorax. No acute osseous abnormality. IMPRESSION: Findings consistent with COVID-19 infection. Electronically Signed   By: Iven Finn M.D.   On: 10/21/2019 17:27    Marzetta Board, MD, PhD [facility]  Between 7 am - 7 pm I am available, please contact me via Amion or Securechat  Between 7 pm - 7 am I am not available, please contact night coverage MD/APP via Amion

## 2019-10-23 NOTE — Progress Notes (Signed)
SATURATION QUALIFICATIONS: (This note is used to comply with regulatory documentation for home oxygen)  Patient Saturations on Room Air at Rest =94%  Patient Saturations on Room Air while Ambulating = 82%  Patient Saturations on 6 Liters of oxygen while Ambulating = 87% . Fluctuating 88% with rest break.  Please briefly explain why patient needs home oxygen:

## 2019-10-24 LAB — CBC WITH DIFFERENTIAL/PLATELET
Abs Immature Granulocytes: 0.09 10*3/uL — ABNORMAL HIGH (ref 0.00–0.07)
Basophils Absolute: 0 10*3/uL (ref 0.0–0.1)
Basophils Relative: 0 %
Eosinophils Absolute: 0 10*3/uL (ref 0.0–0.5)
Eosinophils Relative: 0 %
HCT: 41.1 % (ref 36.0–46.0)
Hemoglobin: 13.6 g/dL (ref 12.0–15.0)
Immature Granulocytes: 1 %
Lymphocytes Relative: 9 %
Lymphs Abs: 0.9 10*3/uL (ref 0.7–4.0)
MCH: 30.6 pg (ref 26.0–34.0)
MCHC: 33.1 g/dL (ref 30.0–36.0)
MCV: 92.6 fL (ref 80.0–100.0)
Monocytes Absolute: 0.5 10*3/uL (ref 0.1–1.0)
Monocytes Relative: 5 %
Neutro Abs: 8.1 10*3/uL — ABNORMAL HIGH (ref 1.7–7.7)
Neutrophils Relative %: 85 %
Platelets: 279 10*3/uL (ref 150–400)
RBC: 4.44 MIL/uL (ref 3.87–5.11)
RDW: 12.2 % (ref 11.5–15.5)
WBC: 9.5 10*3/uL (ref 4.0–10.5)
nRBC: 0 % (ref 0.0–0.2)

## 2019-10-24 LAB — C-REACTIVE PROTEIN: CRP: 1.4 mg/dL — ABNORMAL HIGH (ref <1.0)

## 2019-10-24 LAB — COMPREHENSIVE METABOLIC PANEL
ALT: 66 U/L — ABNORMAL HIGH (ref 0–44)
AST: 53 U/L — ABNORMAL HIGH (ref 15–41)
Albumin: 2.8 g/dL — ABNORMAL LOW (ref 3.5–5.0)
Alkaline Phosphatase: 54 U/L (ref 38–126)
Anion gap: 9 (ref 5–15)
BUN: 23 mg/dL — ABNORMAL HIGH (ref 6–20)
CO2: 31 mmol/L (ref 22–32)
Calcium: 9.1 mg/dL (ref 8.9–10.3)
Chloride: 99 mmol/L (ref 98–111)
Creatinine, Ser: 1.04 mg/dL — ABNORMAL HIGH (ref 0.44–1.00)
GFR calc Af Amer: >60 mL/min (ref >60)
GFR calc non Af Amer: 59 mL/min — ABNORMAL LOW (ref >60)
Glucose, Bld: 216 mg/dL — ABNORMAL HIGH (ref 70–99)
Potassium: 3.9 mmol/L (ref 3.5–5.1)
Sodium: 139 mmol/L (ref 135–145)
Total Bilirubin: 0.2 mg/dL — ABNORMAL LOW (ref 0.3–1.2)
Total Protein: 5.7 g/dL — ABNORMAL LOW (ref 6.5–8.1)

## 2019-10-24 LAB — GLUCOSE, CAPILLARY
Glucose-Capillary: 183 mg/dL — ABNORMAL HIGH (ref 70–99)
Glucose-Capillary: 294 mg/dL — ABNORMAL HIGH (ref 70–99)
Glucose-Capillary: 319 mg/dL — ABNORMAL HIGH (ref 70–99)
Glucose-Capillary: 263 mg/dL — ABNORMAL HIGH (ref 70–99)

## 2019-10-24 LAB — D-DIMER, QUANTITATIVE: D-Dimer, Quant: 0.32 ug/mL-FEU (ref 0.00–0.50)

## 2019-10-24 MED ORDER — BARICITINIB 2 MG PO TABS: 4.0000 mg | ORAL_TABLET | Freq: Every day | ORAL | Status: DC

## 2019-10-24 NOTE — Progress Notes (Signed)
PROGRESS NOTE  Traci Roy QQI:[id number] DOB: 14-May-1960 DOA: 10/21/2019 PCP: Maurice Small, MD   LOS: 2 days   Brief Narrative / Interim history: Traci Roy is an 59 y.o. female past medical history significant for obesity essential hypertension who has not gotten vaccinated started having cough and congestion went and got herself tested 3 days prior to admission on 10/19/2019 and she tested positive for COVID-19 since then her breathing has gotten significantly worse especially with exertion.  Brought into the ED found hypoxic in the 70s and placed on 2 L and she stabilized.  Subjective / 24h Interval events: She feels about the same. Tried walking yesterday but got short of breath. She is now on 5 L at rest which is worse than yesterday  Assessment & Plan:  Principal Problem Acute Hypoxic Respiratory Failure due to Covid-19 Viral Illness -Her oxygen requirements have increased now she is on 5 L at rest -She was started on remdesivir, scheduled to be done on 9/20 on Monday.  -Started on steroids, continue -Incentive spirometry, encourage being prone I-S, flutter valve -Due to worsening oxygenation start baricitinib today  This patient has confirmed COVID-19 in the setting of the ongoing 2020 coronavirus pandemic.  The patient has hypoxia and is high-risk for intubation, but expected to survive >48 hours and has good baseline functional status.  She is not known to be on immunomodulators, anti-rejection medications, or cancer chemotherapy, has no history of TB or latent TB, and no history of diverticulitis or intestinal perforation.  Platelets are >50K, ANC is >500, and ALT/AST are below 5x ULN with no known hepatitis B infection. The investigational nature of this medication was discussed with the patient and she approves the use of this medication. Baricitinib is being used under EUA by the FDA. The patient has no ESRD or AKI,and she is not pregnant. The option to use/refuse  baricitinib treatment under FDA authorization (not approval), the significant known and potential risks and benefits, the extent to which these are unknown, and information regarding all available alternatives were discussed in detail   COVID-19 Labs  Recent Labs    10/22/19 1006 10/23/19 0040 10/24/19 0150  DDIMER 0.44 0.41 0.32  CRP 3.9* 4.1* 1.4*    Lab Results  Component Value Date   SARSCOV2NAA POSITIVE (A) 10/21/2019   Active Problems Essential hypertension-not sure if he truly has hypertension as her blood pressure is normal and she is not on antihypertensives at home., Remains within acceptable range  Acute kidney injury -creatinine has now normalized  Morbid obesity-BMI 43 -patient will benefit from weight loss  Diabetes mellitus with steroid-induced hyperglycemia-continue sliding scale while here.  Her A1c is 7.1  Scheduled Meds: . baricitinib  4 mg Oral Daily  . enoxaparin (LOVENOX) injection  55 mg Subcutaneous Q0600  . escitalopram  20 mg Oral Daily  . insulin aspart  0-15 Units Subcutaneous TID WC  . insulin aspart  0-5 Units Subcutaneous QHS  . methylPREDNISolone (SOLU-MEDROL) injection  80 mg Intravenous Q12H  . [START ON 10/26/2019] predniSONE  50 mg Oral Q breakfast   Continuous Infusions: . remdesivir 100 mg in NS 100 mL 100 mg (10/24/19 0856)   PRN Meds:.acetaminophen, chlorpheniramine-HYDROcodone, guaiFENesin-dextromethorphan, ondansetron **OR** ondansetron (ZOFRAN) IV  DVT prophylaxis: Lovenox Code Status: Full code Family Communication: d/w husband over the phone Jerrye Beavers [phone]) Status is: Inpatient  Remains inpatient appropriate because:IV treatments appropriate due to intensity of illness or inability to take PO Dispo: The patient is from: Home  Anticipated d/c is to: Home              Anticipated d/c date is: 3 days              Patient currently is not medically stable to d/c.  Consultants:  None   Procedures:  None    Microbiology: None   Antibacterials: None    Objective: Vitals:   10/24/19 0822 10/24/19 0929 10/24/19 0953 10/24/19 0954  BP: 128/62     Pulse: (!) 59     Resp: 20     Temp: 98.2 F (36.8 C)     TempSrc: Oral     SpO2: 94% 96% (!) 83% (!) 89%  Weight:      Height:        Intake/Output Summary (Last 24 hours) at 10/24/2019 1001 Last data filed at 10/23/2019 1708 Gross per 24 hour  Intake 90.34 ml  Output --  Net 90.34 ml   Filed Weights   10/22/19 0528  Weight: 115.2 kg    Examination:  Constitutional: No distress, in chair Eyes: No scleral icterus ENMT: Moist mucous membranes Neck: normal, supple Respiratory: Bibasilar rhonchi, no wheezing, no crackles, slightly increased respiratory effort Cardiovascular: Regular rate and rhythm, no murmurs, no peripheral edema Abdomen: Soft, nontender, nondistended, positive bowel sounds Musculoskeletal: no clubbing / cyanosis.  Skin: No rashes seen Neurologic: Nonfocal, equal strength  Data Reviewed: I have independently reviewed following labs and imaging studies   CBC: Recent Labs  Lab 10/21/19 1707 10/23/19 0040 10/24/19 0150  WBC 9.6 4.3 9.5  NEUTROABS  --  3.5 8.1*  HGB 14.5 13.8 13.6  HCT 44.6 41.8 41.1  MCV 91.6 92.1 92.6  PLT 218 231 073   Basic Metabolic Panel: Recent Labs  Lab 10/21/19 1707 10/23/19 0040 10/24/19 0150  NA 138 137 139  K 3.2* 3.6 3.9  CL 95* 98 99  CO2 31 30 31   GLUCOSE 114* 204* 216*  BUN 22* 21* 23*  CREATININE 1.30* 1.04* 1.04*  CALCIUM 8.8* 9.2 9.1   GFR: Estimated Creatinine Clearance: 72.5 mL/min (A) (by C-G formula based on SCr of 1.04 mg/dL (H)). Liver Function Tests: Recent Labs  Lab 10/21/19 1707 10/23/19 0040 10/24/19 0150  AST 98* 83* 53*  ALT 48* 70* 66*  ALKPHOS 59 58 54  BILITOT 0.6 0.6 0.2*  PROT 6.1* 6.1* 5.7*  ALBUMIN 3.0* 2.9* 2.8*   No results for input(s): LIPASE, AMYLASE in the last 168 hours. No results for input(s): AMMONIA in the last  168 hours. Coagulation Profile: No results for input(s): INR, PROTIME in the last 168 hours. Cardiac Enzymes: No results for input(s): CKTOTAL, CKMB, CKMBINDEX, TROPONINI in the last 168 hours. BNP (last 3 results) No results for input(s): PROBNP in the last 8760 hours. HbA1C: Recent Labs    10/22/19 1006  HGBA1C 7.1*   CBG: Recent Labs  Lab 10/23/19 0852 10/23/19 1233 10/23/19 1707 10/23/19 2114 10/24/19 0721  GLUCAP 217* 237* 250* 241* 183*   Lipid Profile: No results for input(s): CHOL, HDL, LDLCALC, TRIG, CHOLHDL, LDLDIRECT in the last 72 hours. Thyroid Function Tests: No results for input(s): TSH, T4TOTAL, FREET4, T3FREE, THYROIDAB in the last 72 hours. Anemia Panel: No results for input(s): VITAMINB12, FOLATE, FERRITIN, TIBC, IRON, RETICCTPCT in the last 72 hours. Urine analysis:    Component Value Date/Time   COLORURINE YELLOW 05/24/2013 [place] 05/24/2013 1456   LABSPEC 1.017 05/24/2013 1456   PHURINE 7.0 05/24/2013 1456  GLUCOSEU NEGATIVE 05/24/2013 [place] 05/24/2013 1456   BILIRUBINUR NEGATIVE 05/24/2013 1456   KETONESUR NEGATIVE 05/24/2013 1456   PROTEINUR NEGATIVE 05/24/2013 1456   UROBILINOGEN 0.2 05/24/2013 1456   NITRITE NEGATIVE 05/24/2013 1456   LEUKOCYTESUR NEGATIVE 05/24/2013 1456   Sepsis Labs: Invalid input(s): PROCALCITONIN, LACTICIDVEN  Recent Results (from the past 240 hour(s))  SARS Coronavirus 2 by RT PCR (hospital order, performed in [facility] hospital lab) Nasopharyngeal Nasopharyngeal Swab     Status: Abnormal   Collection Time: 10/21/19  5:07 PM   Specimen: Nasopharyngeal Swab  Result Value Ref Range Status   SARS Coronavirus 2 POSITIVE (A) NEGATIVE Final    Comment: RESULT CALLED TO, READ BACK BY AND VERIFIED WITH: A,OLEARY @1851  10/21/19 EB (NOTE) SARS-CoV-2 target nucleic acids are DETECTED  SARS-CoV-2 RNA is generally detectable in upper respiratory specimens  during the acute phase of  infection.  Positive results are indicative  of the presence of the identified virus, but do not rule out bacterial infection or co-infection with other pathogens not detected by the test.  Clinical correlation with patient history and  other diagnostic information is necessary to determine patient infection status.  The expected result is negative.  Fact Sheet for Patients:   StrictlyIdeas.no   Fact Sheet for Healthcare Providers:   BankingDealers.co.za    This test is not yet approved or cleared by the [place] FDA and  has been authorized for detection and/or diagnosis of SARS-CoV-2 by FDA under an Emergency Use Authorization (EUA).  This EUA will remain in effect (meaning this test can be  used) for the duration of  the COVID-19 declaration under Section 564(b)(1) of the Act, 21 U.S.C. section 360-bbb-3(b)(1), unless the authorization is terminated or revoked sooner.  Performed at [facility], [place] [address], [place], [place] [address]   MRSA PCR Screening     Status: Abnormal   Collection Time: 10/22/19 11:11 AM   Specimen: Nasal Mucosa; Nasopharyngeal  Result Value Ref Range Status   MRSA by PCR POSITIVE (A) NEGATIVE Final    Comment:        The GeneXpert MRSA Assay (FDA approved for NASAL specimens only), is one component of a comprehensive MRSA colonization surveillance program. It is not intended to diagnose MRSA infection nor to guide or monitor treatment for MRSA infections. CRITICAL RESULT CALLED TO, READ BACK BY AND VERIFIED WITH: RN Paulla Fore AT 3785 10/22/19 BY MM Performed at [facility], [place] [address], [place],  [address]       Radiology Studies: No results found.  Marzetta Board, MD, PhD [facility]  Between 7 am - 7 pm I am available, please contact me via Amion or Securechat  Between 7 pm - 7 am I am not available, please contact night coverage MD/APP via Amion

## 2019-10-24 NOTE — Progress Notes (Signed)
MD made aware:Pt had questions r/t new med order olumiant. Requested pharmacy to speak with patient and patient education handout provided. Pt stated she does not want the olumiant.

## 2019-10-24 NOTE — Plan of Care (Signed)
  Problem: Education: Goal: Knowledge of risk factors and measures for prevention of condition will improve Outcome: Progressing   Problem: Coping: Goal: Psychosocial and spiritual needs will be supported Outcome: Progressing   Problem: Respiratory: Goal: Will maintain a patent airway Outcome: Progressing Goal: Complications related to the disease process, condition or treatment will be avoided or minimized Outcome: Progressing   Problem: Education: Goal: Ability to describe self-care measures that may prevent or decrease complications (Diabetes Survival Skills Education) will improve Outcome: Progressing Goal: Individualized Educational Video(s) Outcome: Progressing   Problem: Coping: Goal: Ability to adjust to condition or change in health will improve Outcome: Progressing   Problem: Fluid Volume: Goal: Ability to maintain a balanced intake and output will improve Outcome: Progressing   Problem: Health Behavior/Discharge Planning: Goal: Ability to identify and utilize available resources and services will improve Outcome: Progressing Goal: Ability to manage health-related needs will improve Outcome: Progressing   Problem: Metabolic: Goal: Ability to maintain appropriate glucose levels will improve Outcome: Progressing   Problem: Nutritional: Goal: Maintenance of adequate nutrition will improve Outcome: Progressing Goal: Progress toward achieving an optimal weight will improve Outcome: Progressing   Problem: Skin Integrity: Goal: Risk for impaired skin integrity will decrease Outcome: Progressing   Problem: Tissue Perfusion: Goal: Adequacy of tissue perfusion will improve Outcome: Progressing   Problem: Education: Goal: Knowledge of General Education information will improve Description: Including pain rating scale, medication(s)/side effects and non-pharmacologic comfort measures Outcome: Progressing   Problem: Health Behavior/Discharge Planning: Goal:  Ability to manage health-related needs will improve Outcome: Progressing   Problem: Clinical Measurements: Goal: Ability to maintain clinical measurements within normal limits will improve Outcome: Progressing Goal: Will remain free from infection Outcome: Progressing Goal: Diagnostic test results will improve Outcome: Progressing Goal: Respiratory complications will improve Outcome: Progressing Goal: Cardiovascular complication will be avoided Outcome: Progressing   Problem: Activity: Goal: Risk for activity intolerance will decrease Outcome: Progressing   Problem: Nutrition: Goal: Adequate nutrition will be maintained Outcome: Progressing   Problem: Coping: Goal: Level of anxiety will decrease Outcome: Progressing   Problem: Elimination: Goal: Will not experience complications related to bowel motility Outcome: Progressing Goal: Will not experience complications related to urinary retention Outcome: Progressing   Problem: Pain Managment: Goal: General experience of comfort will improve Outcome: Progressing   Problem: Safety: Goal: Ability to remain free from injury will improve Outcome: Progressing   Problem: Skin Integrity: Goal: Risk for impaired skin integrity will decrease Outcome: Progressing   

## 2019-10-25 LAB — COMPREHENSIVE METABOLIC PANEL
ALT: 67 U/L — ABNORMAL HIGH (ref 0–44)
AST: 33 U/L (ref 15–41)
Albumin: 2.7 g/dL — ABNORMAL LOW (ref 3.5–5.0)
Alkaline Phosphatase: 51 U/L (ref 38–126)
Anion gap: 12 (ref 5–15)
BUN: 21 mg/dL — ABNORMAL HIGH (ref 6–20)
CO2: 30 mmol/L (ref 22–32)
Calcium: 8.9 mg/dL (ref 8.9–10.3)
Chloride: 96 mmol/L — ABNORMAL LOW (ref 98–111)
Creatinine, Ser: 1.08 mg/dL — ABNORMAL HIGH (ref 0.44–1.00)
GFR calc Af Amer: >60 mL/min (ref >60)
GFR calc non Af Amer: 56 mL/min — ABNORMAL LOW (ref >60)
Glucose, Bld: 313 mg/dL — ABNORMAL HIGH (ref 70–99)
Potassium: 4.2 mmol/L (ref 3.5–5.1)
Sodium: 138 mmol/L (ref 135–145)
Total Bilirubin: 0.9 mg/dL (ref 0.3–1.2)
Total Protein: 5.8 g/dL — ABNORMAL LOW (ref 6.5–8.1)

## 2019-10-25 LAB — CBC WITH DIFFERENTIAL/PLATELET
Abs Immature Granulocytes: 0.08 10*3/uL — ABNORMAL HIGH (ref 0.00–0.07)
Basophils Absolute: 0 10*3/uL (ref 0.0–0.1)
Basophils Relative: 0 %
Eosinophils Absolute: 0 10*3/uL (ref 0.0–0.5)
Eosinophils Relative: 0 %
HCT: 41 % (ref 36.0–46.0)
Hemoglobin: 13.5 g/dL (ref 12.0–15.0)
Immature Granulocytes: 1 %
Lymphocytes Relative: 6 %
Lymphs Abs: 0.7 10*3/uL (ref 0.7–4.0)
MCH: 30.8 pg (ref 26.0–34.0)
MCHC: 32.9 g/dL (ref 30.0–36.0)
MCV: 93.6 fL (ref 80.0–100.0)
Monocytes Absolute: 0.5 10*3/uL (ref 0.1–1.0)
Monocytes Relative: 4 %
Neutro Abs: 10.2 10*3/uL — ABNORMAL HIGH (ref 1.7–7.7)
Neutrophils Relative %: 89 %
Platelets: 297 10*3/uL (ref 150–400)
RBC: 4.38 MIL/uL (ref 3.87–5.11)
RDW: 12.3 % (ref 11.5–15.5)
WBC: 11.4 10*3/uL — ABNORMAL HIGH (ref 4.0–10.5)
nRBC: 0 % (ref 0.0–0.2)

## 2019-10-25 LAB — C-REACTIVE PROTEIN: CRP: 3.6 mg/dL — ABNORMAL HIGH (ref <1.0)

## 2019-10-25 LAB — GLUCOSE, CAPILLARY
Glucose-Capillary: 159 mg/dL — ABNORMAL HIGH (ref 70–99)
Glucose-Capillary: 310 mg/dL — ABNORMAL HIGH (ref 70–99)
Glucose-Capillary: 298 mg/dL — ABNORMAL HIGH (ref 70–99)
Glucose-Capillary: 178 mg/dL — ABNORMAL HIGH (ref 70–99)

## 2019-10-25 LAB — D-DIMER, QUANTITATIVE: D-Dimer, Quant: 0.52 ug/mL-FEU — ABNORMAL HIGH (ref 0.00–0.50)

## 2019-10-25 MED ORDER — FUROSEMIDE 10 MG/ML IJ SOLN
20.0000 mg | Freq: Once | INTRAMUSCULAR | Status: AC
  Administered 2019-10-25: 20 mg via INTRAVENOUS
  Filled 2019-10-25: qty 2

## 2019-10-25 MED ORDER — POLYETHYLENE GLYCOL 3350 17 G PO PACK
17.0000 g | PACK | Freq: Every day | ORAL | Status: DC
  Administered 2019-10-25 – 2019-10-29 (×2): 17 g via ORAL
  Filled 2019-10-25 (×3): qty 1

## 2019-10-25 MED ORDER — INSULIN ASPART 100 UNIT/ML ~~LOC~~ SOLN
3.0000 [IU] | Freq: Three times a day (TID) | SUBCUTANEOUS | Status: DC
  Administered 2019-10-25 – 2019-10-29 (×13): 3 [IU] via SUBCUTANEOUS

## 2019-10-25 NOTE — Progress Notes (Signed)
PROGRESS NOTE  Traci Roy HQI:[id number] DOB: 11-27-1960 DOA: 10/21/2019 PCP: Maurice Small, MD   LOS: 3 days   Brief Narrative / Interim history: Traci Roy is an 59 y.o. female past medical history significant for obesity essential hypertension who has not gotten vaccinated started having cough and congestion went and got herself tested 3 days prior to admission on 10/19/2019 and she tested positive for COVID-19 since then her breathing has gotten significantly worse especially with exertion.  Brought into the ED found hypoxic in the 70s and placed on 2 L and she stabilized.  Subjective / 24h Interval events: Remains on 5 L this morning at rest, begging me to send her home tomorrow.  States that she is fine  Assessment & Plan:  Principal Problem Acute Hypoxic Respiratory Failure due to Covid-19 Viral Illness -She was initially on room air at rest but her oxygen requirements have increased to 5 L on 9/18.  I have discussed with patient risks and benefits of baricitinib and she refused that medication -She was started on remdesivir and steroids, continue -Incentive spirometry, encourage being prone I-S, flutter valve   COVID-19 Labs  Recent Labs    10/23/19 0040 10/24/19 0150  DDIMER 0.41 0.32  CRP 4.1* 1.4*    Lab Results  Component Value Date   SARSCOV2NAA POSITIVE (A) 10/21/2019   Active Problems Essential hypertension-not sure if he truly has hypertension as her blood pressure is normal and she is not on antihypertensives at home.,  Remains acceptable  Acute kidney injury -creatinine has now normalized at 1.0  Morbid obesity-BMI 43 -patient will benefit from weight loss  Diabetes mellitus with steroid-induced hyperglycemia-continue sliding scale while here.  Her A1c is 7.1.   CBG (last 3)  Recent Labs    10/24/19 1704 10/24/19 2115 10/25/19 0750  GLUCAP 319* 263* 159*    Scheduled Meds: . enoxaparin (LOVENOX) injection  55 mg Subcutaneous Q0600  .  escitalopram  20 mg Oral Daily  . furosemide  20 mg Intravenous Once  . insulin aspart  0-15 Units Subcutaneous TID WC  . insulin aspart  0-5 Units Subcutaneous QHS  . polyethylene glycol  17 g Oral Daily  . [START ON 10/26/2019] predniSONE  50 mg Oral Q breakfast   Continuous Infusions: . remdesivir 100 mg in NS 100 mL 100 mg (10/25/19 0922)   PRN Meds:.acetaminophen, chlorpheniramine-HYDROcodone, guaiFENesin-dextromethorphan, ondansetron **OR** ondansetron (ZOFRAN) IV  DVT prophylaxis: Lovenox Code Status: Full code Family Communication: d/w husband over the phone Traci Roy [phone]) on 9/18 Status is: Inpatient  Remains inpatient appropriate because:IV treatments appropriate due to intensity of illness or inability to take PO Dispo: The patient is from: Home              Anticipated d/c is to: Home              Anticipated d/c date is: 3 days              Patient currently is not medically stable to d/c.  Consultants:  None   Procedures:  None   Microbiology: None   Antibacterials: None    Objective: Vitals:   10/24/19 0954 10/24/19 1705 10/24/19 2312 10/25/19 0751  BP:  136/78 (!) 135/57 (!) 127/57  Pulse:  68 60 65  Resp:  18 20 20   Temp:  98.5 F (36.9 C) 98.3 F (36.8 C) 98.6 F (37 C)  TempSrc:  Oral Oral   SpO2: (!) 89% 96% 95% 94%  Weight:  Height:       No intake or output data in the 24 hours ending 10/25/19 1120 Filed Weights   10/22/19 0528  Weight: 115.2 kg    Examination:  Constitutional: NAD, eating breakfast Eyes: No scleral icterus ENMT: Moist mucous membranes Neck: normal, supple Respiratory: Bibasilar rhonchi without wheezing, increased respiratory effort Cardiovascular: Regular rate and rhythm, no murmurs, no edema Abdomen: Soft, nontender, nondistended, positive bowel sounds  Musculoskeletal: no clubbing / cyanosis.  Skin: No rashes seen Neurologic: No focal deficits, equal strength  Data Reviewed: I have independently  reviewed following labs and imaging studies   CBC: Recent Labs  Lab 10/21/19 1707 10/23/19 0040 10/24/19 0150  WBC 9.6 4.3 9.5  NEUTROABS  --  3.5 8.1*  HGB 14.5 13.8 13.6  HCT 44.6 41.8 41.1  MCV 91.6 92.1 92.6  PLT 218 231 222   Basic Metabolic Panel: Recent Labs  Lab 10/21/19 1707 10/23/19 0040 10/24/19 0150  NA 138 137 139  K 3.2* 3.6 3.9  CL 95* 98 99  CO2 31 30 31   GLUCOSE 114* 204* 216*  BUN 22* 21* 23*  CREATININE 1.30* 1.04* 1.04*  CALCIUM 8.8* 9.2 9.1   GFR: Estimated Creatinine Clearance: 72.5 mL/min (A) (by C-G formula based on SCr of 1.04 mg/dL (H)). Liver Function Tests: Recent Labs  Lab 10/21/19 1707 10/23/19 0040 10/24/19 0150  AST 98* 83* 53*  ALT 48* 70* 66*  ALKPHOS 59 58 54  BILITOT 0.6 0.6 0.2*  PROT 6.1* 6.1* 5.7*  ALBUMIN 3.0* 2.9* 2.8*   No results for input(s): LIPASE, AMYLASE in the last 168 hours. No results for input(s): AMMONIA in the last 168 hours. Coagulation Profile: No results for input(s): INR, PROTIME in the last 168 hours. Cardiac Enzymes: No results for input(s): CKTOTAL, CKMB, CKMBINDEX, TROPONINI in the last 168 hours. BNP (last 3 results) No results for input(s): PROBNP in the last 8760 hours. HbA1C: No results for input(s): HGBA1C in the last 72 hours. CBG: Recent Labs  Lab 10/24/19 0721 10/24/19 1135 10/24/19 1704 10/24/19 2115 10/25/19 0750  GLUCAP 183* 294* 319* 263* 159*   Lipid Profile: No results for input(s): CHOL, HDL, LDLCALC, TRIG, CHOLHDL, LDLDIRECT in the last 72 hours. Thyroid Function Tests: No results for input(s): TSH, T4TOTAL, FREET4, T3FREE, THYROIDAB in the last 72 hours. Anemia Panel: No results for input(s): VITAMINB12, FOLATE, FERRITIN, TIBC, IRON, RETICCTPCT in the last 72 hours. Urine analysis:    Component Value Date/Time   COLORURINE YELLOW 05/24/2013 1456   APPEARANCEUR CLEAR 05/24/2013 1456   LABSPEC 1.017 05/24/2013 1456   PHURINE 7.0 05/24/2013 1456   GLUCOSEU NEGATIVE  05/24/2013 1456   HGBUR NEGATIVE 05/24/2013 1456   BILIRUBINUR NEGATIVE 05/24/2013 1456   KETONESUR NEGATIVE 05/24/2013 1456   PROTEINUR NEGATIVE 05/24/2013 1456   UROBILINOGEN 0.2 05/24/2013 1456   NITRITE NEGATIVE 05/24/2013 1456   LEUKOCYTESUR NEGATIVE 05/24/2013 1456   Sepsis Labs: Invalid input(s): PROCALCITONIN, LACTICIDVEN  Recent Results (from the past 240 hour(s))  SARS Coronavirus 2 by RT PCR (hospital order, performed in [facility] hospital lab) Nasopharyngeal Nasopharyngeal Swab     Status: Abnormal   Collection Time: 10/21/19  5:07 PM   Specimen: Nasopharyngeal Swab  Result Value Ref Range Status   SARS Coronavirus 2 POSITIVE (A) NEGATIVE Final    Comment: RESULT CALLED TO, READ BACK BY AND VERIFIED WITH: A,OLEARY @1851  10/21/19 EB (NOTE) SARS-CoV-2 target nucleic acids are DETECTED  SARS-CoV-2 RNA is generally detectable in upper respiratory specimens  during the acute phase of infection.  Positive results are indicative  of the presence of the identified virus, but do not rule out bacterial infection or co-infection with other pathogens not detected by the test.  Clinical correlation with patient history and  other diagnostic information is necessary to determine patient infection status.  The expected result is negative.  Fact Sheet for Patients:   StrictlyIdeas.no   Fact Sheet for Healthcare Providers:   BankingDealers.co.za    This test is not yet approved or cleared by the [place] FDA and  has been authorized for detection and/or diagnosis of SARS-CoV-2 by FDA under an Emergency Use Authorization (EUA).  This EUA will remain in effect (meaning this test can be  used) for the duration of  the COVID-19 declaration under Section 564(b)(1) of the Act, 21 U.S.C. section 360-bbb-3(b)(1), unless the authorization is terminated or revoked sooner.  Performed at [facility], [place] [address],  [place], [place] [address]   MRSA PCR Screening     Status: Abnormal   Collection Time: 10/22/19 11:11 AM   Specimen: Nasal Mucosa; Nasopharyngeal  Result Value Ref Range Status   MRSA by PCR POSITIVE (A) NEGATIVE Final    Comment:        The GeneXpert MRSA Assay (FDA approved for NASAL specimens only), is one component of a comprehensive MRSA colonization surveillance program. It is not intended to diagnose MRSA infection nor to guide or monitor treatment for MRSA infections. CRITICAL RESULT CALLED TO, READ BACK BY AND VERIFIED WITH: RN Paulla Fore AT 8882 10/22/19 BY MM Performed at [facility], [place] [address] [place], [place] [address]       Radiology Studies: No results found.  Marzetta Board, MD, PhD [facility]  Between 7 am - 7 pm I am available, please contact me via Amion or Securechat  Between 7 pm - 7 am I am not available, please contact night coverage MD/APP via Amion

## 2019-10-25 NOTE — Progress Notes (Addendum)
Pt stated has not had a bm in 4 days. Discuss with patient the possibility of miralax or other interventions to help move the bowels. Pt ok with those ideas.

## 2019-10-26 ENCOUNTER — Inpatient Hospital Stay (HOSPITAL_COMMUNITY): Payer: BC Managed Care – PPO

## 2019-10-26 LAB — PROCALCITONIN: Procalcitonin: 0.16 ng/mL

## 2019-10-26 LAB — CBC WITH DIFFERENTIAL/PLATELET
Abs Immature Granulocytes: 0.18 10*3/uL — ABNORMAL HIGH (ref 0.00–0.07)
Basophils Absolute: 0 10*3/uL (ref 0.0–0.1)
Basophils Relative: 0 %
Eosinophils Absolute: 0 10*3/uL (ref 0.0–0.5)
Eosinophils Relative: 0 %
HCT: 40.1 % (ref 36.0–46.0)
Hemoglobin: 12.9 g/dL (ref 12.0–15.0)
Immature Granulocytes: 1 %
Lymphocytes Relative: 7 %
Lymphs Abs: 1.1 10*3/uL (ref 0.7–4.0)
MCH: 29.8 pg (ref 26.0–34.0)
MCHC: 32.2 g/dL (ref 30.0–36.0)
MCV: 92.6 fL (ref 80.0–100.0)
Monocytes Absolute: 0.9 10*3/uL (ref 0.1–1.0)
Monocytes Relative: 6 %
Neutro Abs: 14.1 10*3/uL — ABNORMAL HIGH (ref 1.7–7.7)
Neutrophils Relative %: 86 %
Platelets: 267 10*3/uL (ref 150–400)
RBC: 4.33 MIL/uL (ref 3.87–5.11)
RDW: 12.4 % (ref 11.5–15.5)
WBC: 16.3 10*3/uL — ABNORMAL HIGH (ref 4.0–10.5)
nRBC: 0 % (ref 0.0–0.2)

## 2019-10-26 LAB — GLUCOSE, CAPILLARY
Glucose-Capillary: 182 mg/dL — ABNORMAL HIGH (ref 70–99)
Glucose-Capillary: 206 mg/dL — ABNORMAL HIGH (ref 70–99)
Glucose-Capillary: 250 mg/dL — ABNORMAL HIGH (ref 70–99)
Glucose-Capillary: 340 mg/dL — ABNORMAL HIGH (ref 70–99)

## 2019-10-26 LAB — COMPREHENSIVE METABOLIC PANEL
ALT: 48 U/L — ABNORMAL HIGH (ref 0–44)
AST: 23 U/L (ref 15–41)
Albumin: 2.5 g/dL — ABNORMAL LOW (ref 3.5–5.0)
Alkaline Phosphatase: 50 U/L (ref 38–126)
Anion gap: 11 (ref 5–15)
BUN: 17 mg/dL (ref 6–20)
CO2: 32 mmol/L (ref 22–32)
Calcium: 8.4 mg/dL — ABNORMAL LOW (ref 8.9–10.3)
Chloride: 95 mmol/L — ABNORMAL LOW (ref 98–111)
Creatinine, Ser: 1.07 mg/dL — ABNORMAL HIGH (ref 0.44–1.00)
GFR calc Af Amer: >60 mL/min (ref >60)
GFR calc non Af Amer: 57 mL/min — ABNORMAL LOW (ref >60)
Glucose, Bld: 202 mg/dL — ABNORMAL HIGH (ref 70–99)
Potassium: 3.9 mmol/L (ref 3.5–5.1)
Sodium: 138 mmol/L (ref 135–145)
Total Bilirubin: 1.2 mg/dL (ref 0.3–1.2)
Total Protein: 5.6 g/dL — ABNORMAL LOW (ref 6.5–8.1)

## 2019-10-26 LAB — D-DIMER, QUANTITATIVE: D-Dimer, Quant: 0.7 ug/mL-FEU — ABNORMAL HIGH (ref 0.00–0.50)

## 2019-10-26 LAB — C-REACTIVE PROTEIN: CRP: 14.2 mg/dL — ABNORMAL HIGH (ref <1.0)

## 2019-10-26 MED ORDER — SODIUM CHLORIDE 0.9 % IV SOLN
1.0000 g | INTRAVENOUS | Status: DC
  Administered 2019-10-26 – 2019-10-28 (×3): 1 g via INTRAVENOUS
  Filled 2019-10-26 (×2): qty 10
  Filled 2019-10-26: qty 1
  Filled 2019-10-26: qty 10

## 2019-10-26 MED ORDER — METHYLPREDNISOLONE SODIUM SUCC 125 MG IJ SOLR
80.0000 mg | Freq: Two times a day (BID) | INTRAMUSCULAR | Status: DC
  Administered 2019-10-26 – 2019-10-29 (×6): 80 mg via INTRAVENOUS
  Filled 2019-10-26 (×7): qty 2

## 2019-10-26 MED ORDER — MUPIROCIN 2 % EX OINT
1.0000 "application " | TOPICAL_OINTMENT | Freq: Two times a day (BID) | CUTANEOUS | Status: DC
  Administered 2019-10-26 – 2019-10-29 (×5): 1 via NASAL
  Filled 2019-10-26 (×4): qty 22

## 2019-10-26 MED ORDER — BARICITINIB 2 MG PO TABS
2.0000 mg | ORAL_TABLET | Freq: Every day | ORAL | Status: DC
  Administered 2019-10-26 – 2019-10-27 (×2): 2 mg via ORAL
  Filled 2019-10-26 (×2): qty 1

## 2019-10-26 MED ORDER — CHLORHEXIDINE GLUCONATE CLOTH 2 % EX PADS
6.0000 | MEDICATED_PAD | Freq: Every day | CUTANEOUS | Status: DC
  Administered 2019-10-27 – 2019-10-29 (×3): 6 via TOPICAL

## 2019-10-26 MED ORDER — SODIUM CHLORIDE 0.9 % IV SOLN
500.0000 mg | INTRAVENOUS | Status: DC
  Administered 2019-10-26 – 2019-10-28 (×3): 500 mg via INTRAVENOUS
  Filled 2019-10-26 (×5): qty 500

## 2019-10-26 NOTE — Progress Notes (Addendum)
PROGRESS NOTE  Traci Roy [id number]:[id number] DOB: 12/05/1960 DOA: 10/21/2019 PCP: Maurice Small, MD   LOS: 4 days   Brief Narrative / Interim history: Traci Roy is an 59 y.o. female past medical history significant for obesity essential hypertension who has not gotten vaccinated started having cough and congestion went and got herself tested 3 days prior to admission on 10/19/2019 and she tested positive for COVID-19 since then her breathing has gotten significantly worse especially with exertion.  Brought into the ED found hypoxic in the 70s and placed on 2 L and she stabilized.  Hospital course complicated by progressively worsening respiratory failure despite Remdesivir and steroids, and unfortunately patient refused baricitinib  Subjective / 24h Interval events: -Hypoxia worsened overnight she is on 10 L high flow this morning.  She keeps asking me if she could go home.  Reports significant shortness of breath with minimal activity but wants to "heal" at home  Assessment & Plan:  Principal Problem Acute Hypoxic Respiratory Failure due to Covid-19 Viral Illness -She was initially on room air at rest but her oxygen requirements have increased to 5 L on 9/18.  I have discussed with patient risks and benefits of baricitinib and she refused that medication -She was started on remdesivir and steroids, continue -Incentive spirometry, encourage being prone I-S, flutter valve -Unfortunately patient continues to get worse today and currently on 12 heated high flow around lunchtime.  A chest x-ray showed significant worsening of the infiltrates, concerning for superimposed bacterial pneumonia.  Her white count increased a little bit so I will cover with ceftriaxone and azithromycin, obtain procalcitonin. -Patient quite insistent that she goes home despite me repeatedly explaining that this is physically not possible due to the fact that she is on an amount of oxygen that cannot be done in the home  environment.  She also says that she would never want to be intubated, I have discussed this extensively with her and she is adamant about not being on a breathing machine.  She is asking again about baricitinib, she will likely benefit from it before she is getting to this point but I will be willing to start if she chooses to.  I have also discussed extensively with the husband over the phone, he is distraught obviously by the fact that she is getting worse and the fact that she wants to be home but wishes for her to stay here where she can get the care that cannot be provided at home.  COVID-19 Labs  Recent Labs    10/24/19 0150 10/25/19 1107  DDIMER 0.32 0.52*  CRP 1.4* 3.6*    Lab Results  Component Value Date   SARSCOV2NAA POSITIVE (A) 10/21/2019   Active Problems Essential hypertension-not sure if he truly has hypertension as her blood pressure is normal and she is not on antihypertensives at home.,  Remains acceptable  Acute kidney injury -creatinine has now normalized at 1.0  Morbid obesity-BMI 43 -patient will benefit from weight loss  Diabetes mellitus with steroid-induced hyperglycemia-continue sliding scale while here.  Her A1c is 7.1.   CBG (last 3)  Recent Labs    10/25/19 2157 10/26/19 0844 10/26/19 1203  GLUCAP 178* 182* 206*    Scheduled Meds: . [START ON 10/27/2019] Chlorhexidine Gluconate Cloth  6 each Topical Q0600  . enoxaparin (LOVENOX) injection  55 mg Subcutaneous Q0600  . escitalopram  20 mg Oral Daily  . insulin aspart  0-15 Units Subcutaneous TID WC  . insulin aspart  0-5 Units Subcutaneous QHS  . insulin aspart  3 Units Subcutaneous TID WC  . methylPREDNISolone (SOLU-MEDROL) injection  80 mg Intravenous Q12H  . mupirocin ointment  1 application Nasal BID  . polyethylene glycol  17 g Oral Daily   Continuous Infusions: . azithromycin    . cefTRIAXone (ROCEPHIN)  IV     PRN Meds:.acetaminophen, chlorpheniramine-HYDROcodone,  guaiFENesin-dextromethorphan, ondansetron **OR** ondansetron (ZOFRAN) IV  DVT prophylaxis: Lovenox Code Status: Full code Family Communication: d/w husband over the phone Traci Roy [phone])  Status is: Inpatient  Remains inpatient appropriate because:IV treatments appropriate due to intensity of illness or inability to take PO Dispo: The patient is from: Home              Anticipated d/c is to: Home              Anticipated d/c date is: 3 days              Patient currently is not medically stable to d/c.  Consultants:  None   Procedures:  None   Microbiology: None   Antibacterials: Ceftriaxone 9/20 >> Azithromycin 9/20 >>  Objective: Vitals:   10/25/19 1625 10/25/19 2156 10/26/19 0847 10/26/19 0955  BP: 132/67 (!) 152/83 (!) 95/59   Pulse: 68 75 81   Resp: 20 18 16    Temp: (!) 97.5 F (36.4 C) 98.2 F (36.8 C) 98.7 F (37.1 C)   TempSrc:      SpO2: 93% 95% 93% 90%  Weight:      Height:       No intake or output data in the 24 hours ending 10/26/19 1407 Filed Weights   10/22/19 0528  Weight: 115.2 kg    Examination:  Constitutional: Laying in bed, tachypneic at times Eyes: No scleral icterus ENMT: Moist mucous membranes Neck: normal, supple Respiratory: Coarse breath sounds bilaterally, rhonchi present, no wheezing Cardiovascular: Regular rate and rhythm, no murmurs appreciated, no peripheral edema Abdomen: Soft, nontender, nondistended, positive bowel sounds Musculoskeletal: no clubbing / cyanosis.  Skin: No rashes seen Neurologic: Nonfocal, equal strength  Data Reviewed: I have independently reviewed following labs and imaging studies   CBC: Recent Labs  Lab 10/21/19 1707 10/23/19 0040 10/24/19 0150 10/25/19 1107  WBC 9.6 4.3 9.5 11.4*  NEUTROABS  --  3.5 8.1* 10.2*  HGB 14.5 13.8 13.6 13.5  HCT 44.6 41.8 41.1 41.0  MCV 91.6 92.1 92.6 93.6  PLT 218 231 279 749   Basic Metabolic Panel: Recent Labs  Lab 10/21/19 1707 10/23/19 0040  10/24/19 0150 10/25/19 1107  NA 138 137 139 138  K 3.2* 3.6 3.9 4.2  CL 95* 98 99 96*  CO2 31 30 31 30   GLUCOSE 114* 204* 216* 313*  BUN 22* 21* 23* 21*  CREATININE 1.30* 1.04* 1.04* 1.08*  CALCIUM 8.8* 9.2 9.1 8.9   GFR: Estimated Creatinine Clearance: 69.9 mL/min (A) (by C-G formula based on SCr of 1.08 mg/dL (H)). Liver Function Tests: Recent Labs  Lab 10/21/19 1707 10/23/19 0040 10/24/19 0150 10/25/19 1107  AST 98* 83* 53* 33  ALT 48* 70* 66* 67*  ALKPHOS 59 58 54 51  BILITOT 0.6 0.6 0.2* 0.9  PROT 6.1* 6.1* 5.7* 5.8*  ALBUMIN 3.0* 2.9* 2.8* 2.7*   No results for input(s): LIPASE, AMYLASE in the last 168 hours. No results for input(s): AMMONIA in the last 168 hours. Coagulation Profile: No results for input(s): INR, PROTIME in the last 168 hours. Cardiac Enzymes: No results for input(s): CKTOTAL, CKMB,  CKMBINDEX, TROPONINI in the last 168 hours. BNP (last 3 results) No results for input(s): PROBNP in the last 8760 hours. HbA1C: No results for input(s): HGBA1C in the last 72 hours. CBG: Recent Labs  Lab 10/25/19 1139 10/25/19 1624 10/25/19 2157 10/26/19 0844 10/26/19 1203  GLUCAP 310* 298* 178* 182* 206*   Lipid Profile: No results for input(s): CHOL, HDL, LDLCALC, TRIG, CHOLHDL, LDLDIRECT in the last 72 hours. Thyroid Function Tests: No results for input(s): TSH, T4TOTAL, FREET4, T3FREE, THYROIDAB in the last 72 hours. Anemia Panel: No results for input(s): VITAMINB12, FOLATE, FERRITIN, TIBC, IRON, RETICCTPCT in the last 72 hours. Urine analysis:    Component Value Date/Time   COLORURINE YELLOW 05/24/2013 1456   APPEARANCEUR CLEAR 05/24/2013 1456   LABSPEC 1.017 05/24/2013 1456   PHURINE 7.0 05/24/2013 1456   GLUCOSEU NEGATIVE 05/24/2013 1456   HGBUR NEGATIVE 05/24/2013 1456   BILIRUBINUR NEGATIVE 05/24/2013 1456   KETONESUR NEGATIVE 05/24/2013 1456   PROTEINUR NEGATIVE 05/24/2013 1456   UROBILINOGEN 0.2 05/24/2013 1456   NITRITE NEGATIVE  05/24/2013 1456   LEUKOCYTESUR NEGATIVE 05/24/2013 1456   Sepsis Labs: Invalid input(s): PROCALCITONIN, LACTICIDVEN  Recent Results (from the past 240 hour(s))  SARS Coronavirus 2 by RT PCR (hospital order, performed in [facility] hospital lab) Nasopharyngeal Nasopharyngeal Swab     Status: Abnormal   Collection Time: 10/21/19  5:07 PM   Specimen: Nasopharyngeal Swab  Result Value Ref Range Status   SARS Coronavirus 2 POSITIVE (A) NEGATIVE Final    Comment: RESULT CALLED TO, READ BACK BY AND VERIFIED WITH: A,OLEARY @1851  10/21/19 EB (NOTE) SARS-CoV-2 target nucleic acids are DETECTED  SARS-CoV-2 RNA is generally detectable in upper respiratory specimens  during the acute phase of infection.  Positive results are indicative  of the presence of the identified virus, but do not rule out bacterial infection or co-infection with other pathogens not detected by the test.  Clinical correlation with patient history and  other diagnostic information is necessary to determine patient infection status.  The expected result is negative.  Fact Sheet for Patients:   StrictlyIdeas.no   Fact Sheet for Healthcare Providers:   BankingDealers.co.za    This test is not yet approved or cleared by the [place] FDA and  has been authorized for detection and/or diagnosis of SARS-CoV-2 by FDA under an Emergency Use Authorization (EUA).  This EUA will remain in effect (meaning this test can be  used) for the duration of  the COVID-19 declaration under Section 564(b)(1) of the Act, 21 U.S.C. section 360-bbb-3(b)(1), unless the authorization is terminated or revoked sooner.  Performed at [facility], [place] [address] [place], [place] [address]   MRSA PCR Screening     Status: Abnormal   Collection Time: 10/22/19 11:11 AM   Specimen: Nasal Mucosa; Nasopharyngeal  Result Value Ref Range Status   MRSA by PCR POSITIVE (A) NEGATIVE Final     Comment:        The GeneXpert MRSA Assay (FDA approved for NASAL specimens only), is one component of a comprehensive MRSA colonization surveillance program. It is not intended to diagnose MRSA infection nor to guide or monitor treatment for MRSA infections. CRITICAL RESULT CALLED TO, READ BACK BY AND VERIFIED WITH: RN Paulla Fore AT 5093 10/22/19 BY MM Performed at [facility], [place] [address] [place], [place] [address]       Radiology Studies: DG CHEST PORT 1 VIEW  Result Date: 10/26/2019 CLINICAL DATA:  Shortness of breath.  Reported COVID-19 positive EXAM: PORTABLE CHEST 1 VIEW COMPARISON:  October 21, 2019 FINDINGS: There is airspace consolidation in the right mid and lower lung regions, significantly increased from most recent study. There is localized volume loss in the right mid lung region. There is subtle airspace opacity in the left mid lung, stable. Heart is upper normal in size with pulmonary vascularity normal. No adenopathy. No bone lesions. IMPRESSION: There is now increase in airspace opacity with consolidation in in portions of the right mid and lower lung regions with localized volume loss in the right mid lung region. Subtle airspace opacity in the left mid lung is stable. Stable cardiac silhouette. Question bacterial superinfection on atypical organism pneumonia given the appearance on the right. Electronically Signed   By: Lowella Grip III M.D.   On: 10/26/2019 08:06    Marzetta Board, MD, PhD [facility]  Between 7 am - 7 pm I am available, please contact me via Amion or Securechat  Between 7 pm - 7 am I am not available, please contact night coverage MD/APP via Amion

## 2019-10-27 LAB — COMPREHENSIVE METABOLIC PANEL
ALT: 43 U/L (ref 0–44)
AST: 20 U/L (ref 15–41)
Albumin: 2.4 g/dL — ABNORMAL LOW (ref 3.5–5.0)
Alkaline Phosphatase: 54 U/L (ref 38–126)
Anion gap: 8 (ref 5–15)
BUN: 17 mg/dL (ref 6–20)
CO2: 33 mmol/L — ABNORMAL HIGH (ref 22–32)
Calcium: 8.9 mg/dL (ref 8.9–10.3)
Chloride: 97 mmol/L — ABNORMAL LOW (ref 98–111)
Creatinine, Ser: 1.03 mg/dL — ABNORMAL HIGH (ref 0.44–1.00)
GFR calc Af Amer: >60 mL/min (ref >60)
GFR calc non Af Amer: 59 mL/min — ABNORMAL LOW (ref >60)
Glucose, Bld: 315 mg/dL — ABNORMAL HIGH (ref 70–99)
Potassium: 4.3 mmol/L (ref 3.5–5.1)
Sodium: 138 mmol/L (ref 135–145)
Total Bilirubin: 0.5 mg/dL (ref 0.3–1.2)
Total Protein: 5.8 g/dL — ABNORMAL LOW (ref 6.5–8.1)

## 2019-10-27 LAB — CBC WITH DIFFERENTIAL/PLATELET
Abs Immature Granulocytes: 0.08 10*3/uL — ABNORMAL HIGH (ref 0.00–0.07)
Basophils Absolute: 0 10*3/uL (ref 0.0–0.1)
Basophils Relative: 0 %
Eosinophils Absolute: 0 10*3/uL (ref 0.0–0.5)
Eosinophils Relative: 0 %
HCT: 40.4 % (ref 36.0–46.0)
Hemoglobin: 13.1 g/dL (ref 12.0–15.0)
Immature Granulocytes: 1 %
Lymphocytes Relative: 7 %
Lymphs Abs: 0.8 10*3/uL (ref 0.7–4.0)
MCH: 30.7 pg (ref 26.0–34.0)
MCHC: 32.4 g/dL (ref 30.0–36.0)
MCV: 94.6 fL (ref 80.0–100.0)
Monocytes Absolute: 0.4 10*3/uL (ref 0.1–1.0)
Monocytes Relative: 3 %
Neutro Abs: 11.1 10*3/uL — ABNORMAL HIGH (ref 1.7–7.7)
Neutrophils Relative %: 89 %
Platelets: 283 10*3/uL (ref 150–400)
RBC: 4.27 MIL/uL (ref 3.87–5.11)
RDW: 12.6 % (ref 11.5–15.5)
WBC: 12.4 10*3/uL — ABNORMAL HIGH (ref 4.0–10.5)
nRBC: 0 % (ref 0.0–0.2)

## 2019-10-27 LAB — GLUCOSE, CAPILLARY
Glucose-Capillary: 260 mg/dL — ABNORMAL HIGH (ref 70–99)
Glucose-Capillary: 330 mg/dL — ABNORMAL HIGH (ref 70–99)
Glucose-Capillary: 366 mg/dL — ABNORMAL HIGH (ref 70–99)
Glucose-Capillary: 338 mg/dL — ABNORMAL HIGH (ref 70–99)

## 2019-10-27 LAB — C-REACTIVE PROTEIN: CRP: 23.4 mg/dL — ABNORMAL HIGH (ref <1.0)

## 2019-10-27 LAB — D-DIMER, QUANTITATIVE: D-Dimer, Quant: 1.56 ug/mL-FEU — ABNORMAL HIGH (ref 0.00–0.50)

## 2019-10-27 MED ORDER — BARICITINIB 2 MG PO TABS
4.0000 mg | ORAL_TABLET | Freq: Every day | ORAL | Status: DC
  Administered 2019-10-28 – 2019-10-29 (×2): 4 mg via ORAL
  Filled 2019-10-27 (×2): qty 2

## 2019-10-27 NOTE — Progress Notes (Signed)
PROGRESS NOTE  [name]:[id number] DOB: [birth year] DOA: 10/21/2019 PCP: Maurice Small, MD   LOS: 5 days   Brief Narrative / Interim history: Traci Roy is an 59 y.o. female past medical history significant for obesity essential hypertension who has not gotten vaccinated started having cough and congestion went and got herself tested 3 days prior to admission on 10/19/2019 and she tested positive for COVID-19 since then her breathing has gotten significantly worse especially with exertion.  Brought into the ED found hypoxic in the 70s and placed on 2 L and she stabilized.  Hospital course complicated by progressively worsening respiratory failure despite Remdesivir and steroids, and unfortunately patient refused baricitinib  Subjective / 24h Interval events: -Stable compared to yesterday, 12 L nasal cannula  -She is doing well, feeling short of breath with activity.  She appears in better spirits and states that she has more energy today than she did yesterday.  No chest pain, no abdominal pain  Assessment & Plan:  Principal Problem Acute Hypoxic Respiratory Failure due to Covid-19 Viral Illness -She was initially on room air at rest but her oxygen requirements have increased to 5 L on 9/18.  I have discussed with patient risks and benefits of baricitinib and she refused that medication -She was started on remdesivir and steroids, continue -Incentive spirometry, encourage being prone I-S, flutter valve -Unfortunately patient continued to get worse and her oxygen levels got to 12 L heated high flow on 9/22.  After discussion of risks/benefits she eventually agreed to baricitinib which was started on 9/22. -Chest x-ray on 9/22 showed worsening infiltrates concerning for superimposed bacterial pneumonia given complaints.  Given elevated WBC she was started on ceftriaxone and azithromycin at that time.  Procalcitonin minimally elevated 0.16, favor doing antibiotics for 3 days  only -CRP, D-dimer continue to rise, continue to closely monitor  COVID-19 Labs  Recent Labs    10/25/19 1107 10/26/19 1412 10/27/19 0547  DDIMER 0.52* 0.70* 1.56*  CRP 3.6* 14.2* 23.4*    Lab Results  Component Value Date   SARSCOV2NAA POSITIVE (A) 10/21/2019   Active Problems Essential hypertension-not sure if he truly has hypertension as her blood pressure is normal and she is not on antihypertensives at home.,  Remains acceptable  Acute kidney injury -creatinine has now normalized at 1.0 and is remaining stable  Morbid obesity-BMI 43 -patient will benefit from weight loss  Diabetes mellitus with steroid-induced hyperglycemia-continue sliding scale while here.  Her A1c is 7.1.   CBG (last 3)  Recent Labs    10/26/19 1648 10/26/19 2038 10/27/19 0751  GLUCAP 250* 340* 260*    Scheduled Meds: . [START ON 10/28/2019] baricitinib  4 mg Oral Daily  . Chlorhexidine Gluconate Cloth  6 each Topical Q0600  . enoxaparin (LOVENOX) injection  55 mg Subcutaneous Q0600  . escitalopram  20 mg Oral Daily  . insulin aspart  0-15 Units Subcutaneous TID WC  . insulin aspart  0-5 Units Subcutaneous QHS  . insulin aspart  3 Units Subcutaneous TID WC  . methylPREDNISolone (SOLU-MEDROL) injection  80 mg Intravenous Q12H  . mupirocin ointment  1 application Nasal BID  . polyethylene glycol  17 g Oral Daily   Continuous Infusions: . azithromycin 500 mg (10/26/19 1521)  . cefTRIAXone (ROCEPHIN)  IV 1 g (10/26/19 1427)   PRN Meds:.acetaminophen, chlorpheniramine-HYDROcodone, guaiFENesin-dextromethorphan, ondansetron **OR** ondansetron (ZOFRAN) IV  DVT prophylaxis: Lovenox Code Status: Full code Family Communication: d/w husband over the phone Jerrye Beavers [phone])  Good morning  this is Dr. Cruzita Lederer. Status is: Inpatient  Remains inpatient appropriate because:IV treatments appropriate due to intensity of illness or inability to take PO Dispo: The patient is from: Home               Anticipated d/c is to: Home              Anticipated d/c date is: 3 days              Patient currently is not medically stable to d/c.  Consultants:  None   Procedures:  None   Microbiology: None   Antibacterials: Ceftriaxone 9/20 >> Azithromycin 9/20 >>  Objective: Vitals:   10/26/19 0955 10/26/19 1651 10/26/19 2037 10/27/19 0749  BP:  (!) 98/53 (!) 124/59 127/62  Pulse:  68 68 (!) 56  Resp:  16 18 20   Temp:   97.8 F (36.6 C) 97.8 F (36.6 C)  TempSrc:      SpO2: 90% 95% 95% (!) 89%  Weight:      Height:        Intake/Output Summary (Last 24 hours) at 10/27/2019 1131 Last data filed at 10/26/2019 1521 Gross per 24 hour  Intake 350 ml  Output --  Net 350 ml   Filed Weights   10/22/19 0528  Weight: 115.2 kg    Examination:  Constitutional: In bed, no apparent distress Eyes: No icterus ENMT: Moist mucous membranes Neck: normal, supple Respiratory: Coarse breath sounds bilaterally, rhonchi present, no wheezing Cardiovascular: Regular rate and rhythm, no murmurs Abdomen: Soft, nontender, nondistended, bowel sounds positive Musculoskeletal: no clubbing / cyanosis.  Skin: No rashes seen Neurologic: No focal deficits, equal strength  Data Reviewed: I have independently reviewed following labs and imaging studies   CBC: Recent Labs  Lab 10/23/19 0040 10/24/19 0150 10/25/19 1107 10/26/19 1412 10/27/19 0547  WBC 4.3 9.5 11.4* 16.3* 12.4*  NEUTROABS 3.5 8.1* 10.2* 14.1* 11.1*  HGB 13.8 13.6 13.5 12.9 13.1  HCT 41.8 41.1 41.0 40.1 40.4  MCV 92.1 92.6 93.6 92.6 94.6  PLT 231 279 297 267 465   Basic Metabolic Panel: Recent Labs  Lab 10/23/19 0040 10/24/19 0150 10/25/19 1107 10/26/19 1412 10/27/19 0547  NA 137 139 138 138 138  K 3.6 3.9 4.2 3.9 4.3  CL 98 99 96* 95* 97*  CO2 30 31 30  32 33*  GLUCOSE 204* 216* 313* 202* 315*  BUN 21* 23* 21* 17 17  CREATININE 1.04* 1.04* 1.08* 1.07* 1.03*  CALCIUM 9.2 9.1 8.9 8.4* 8.9   GFR: Estimated  Creatinine Clearance: 73.3 mL/min (A) (by C-G formula based on SCr of 1.03 mg/dL (H)). Liver Function Tests: Recent Labs  Lab 10/23/19 0040 10/24/19 0150 10/25/19 1107 10/26/19 1412 10/27/19 0547  AST 83* 53* 33 23 20  ALT 70* 66* 67* 48* 43  ALKPHOS 58 54 51 50 54  BILITOT 0.6 0.2* 0.9 1.2 0.5  PROT 6.1* 5.7* 5.8* 5.6* 5.8*  ALBUMIN 2.9* 2.8* 2.7* 2.5* 2.4*   No results for input(s): LIPASE, AMYLASE in the last 168 hours. No results for input(s): AMMONIA in the last 168 hours. Coagulation Profile: No results for input(s): INR, PROTIME in the last 168 hours. Cardiac Enzymes: No results for input(s): CKTOTAL, CKMB, CKMBINDEX, TROPONINI in the last 168 hours. BNP (last 3 results) No results for input(s): PROBNP in the last 8760 hours. HbA1C: No results for input(s): HGBA1C in the last 72 hours. CBG: Recent Labs  Lab 10/26/19 0844 10/26/19 1203 10/26/19 1648 10/26/19 2038  10/27/19 0751  GLUCAP 182* 206* 250* 340* 260*   Lipid Profile: No results for input(s): CHOL, HDL, LDLCALC, TRIG, CHOLHDL, LDLDIRECT in the last 72 hours. Thyroid Function Tests: No results for input(s): TSH, T4TOTAL, FREET4, T3FREE, THYROIDAB in the last 72 hours. Anemia Panel: No results for input(s): VITAMINB12, FOLATE, FERRITIN, TIBC, IRON, RETICCTPCT in the last 72 hours. Urine analysis:    Component Value Date/Time   COLORURINE YELLOW 05/24/2013 1456   APPEARANCEUR CLEAR 05/24/2013 1456   LABSPEC 1.017 05/24/2013 1456   PHURINE 7.0 05/24/2013 1456   GLUCOSEU NEGATIVE 05/24/2013 1456   HGBUR NEGATIVE 05/24/2013 1456   BILIRUBINUR NEGATIVE 05/24/2013 1456   KETONESUR NEGATIVE 05/24/2013 1456   PROTEINUR NEGATIVE 05/24/2013 1456   UROBILINOGEN 0.2 05/24/2013 1456   NITRITE NEGATIVE 05/24/2013 1456   LEUKOCYTESUR NEGATIVE 05/24/2013 1456   Sepsis Labs: Invalid input(s): PROCALCITONIN, LACTICIDVEN  Recent Results (from the past 240 hour(s))  SARS Coronavirus 2 by RT PCR (hospital order,  performed in [facility] hospital lab) Nasopharyngeal Nasopharyngeal Swab     Status: Abnormal   Collection Time: 10/21/19  5:07 PM   Specimen: Nasopharyngeal Swab  Result Value Ref Range Status   SARS Coronavirus 2 POSITIVE (A) NEGATIVE Final    Comment: RESULT CALLED TO, READ BACK BY AND VERIFIED WITH: A,OLEARY @1851  10/21/19 EB (NOTE) SARS-CoV-2 target nucleic acids are DETECTED  SARS-CoV-2 RNA is generally detectable in upper respiratory specimens  during the acute phase of infection.  Positive results are indicative  of the presence of the identified virus, but do not rule out bacterial infection or co-infection with other pathogens not detected by the test.  Clinical correlation with patient history and  other diagnostic information is necessary to determine patient infection status.  The expected result is negative.  Fact Sheet for Patients:   StrictlyIdeas.no   Fact Sheet for Healthcare Providers:   BankingDealers.co.za    This test is not yet approved or cleared by the [place] FDA and  has been authorized for detection and/or diagnosis of SARS-CoV-2 by FDA under an Emergency Use Authorization (EUA).  This EUA will remain in effect (meaning this test can be  used) for the duration of  the COVID-19 declaration under Section 564(b)(1) of the Act, 21 U.S.C. section 360-bbb-3(b)(1), unless the authorization is terminated or revoked sooner.  Performed at [facility], [place] [address], [place], [place] [address]   MRSA PCR Screening     Status: Abnormal   Collection Time: 10/22/19 11:11 AM   Specimen: Nasal Mucosa; Nasopharyngeal  Result Value Ref Range Status   MRSA by PCR POSITIVE (A) NEGATIVE Final    Comment:        The GeneXpert MRSA Assay (FDA approved for NASAL specimens only), is one component of a comprehensive MRSA colonization surveillance program. It is not intended to diagnose MRSA infection nor to  guide or monitor treatment for MRSA infections. CRITICAL RESULT CALLED TO, READ BACK BY AND VERIFIED WITH: RN Paulla Fore AT 5400 10/22/19 BY MM Performed at [facility], [place] [address] [place], [place] [address]       Radiology Studies: DG CHEST PORT 1 VIEW  Result Date: 10/26/2019 CLINICAL DATA:  Shortness of breath.  Reported COVID-19 positive EXAM: PORTABLE CHEST 1 VIEW COMPARISON:  October 21, 2019 FINDINGS: There is airspace consolidation in the right mid and lower lung regions, significantly increased from most recent study. There is localized volume loss in the right mid lung region. There  is subtle airspace opacity in the left mid lung, stable. Heart is upper normal in size with pulmonary vascularity normal. No adenopathy. No bone lesions. IMPRESSION: There is now increase in airspace opacity with consolidation in in portions of the right mid and lower lung regions with localized volume loss in the right mid lung region. Subtle airspace opacity in the left mid lung is stable. Stable cardiac silhouette. Question bacterial superinfection on atypical organism pneumonia given the appearance on the right. Electronically Signed   By: Lowella Grip III M.D.   On: 10/26/2019 08:06    Marzetta Board, MD, PhD [facility]  Between 7 am - 7 pm I am available, please contact me via Amion or Securechat  Between 7 pm - 7 am I am not available, please contact night coverage MD/APP via Amion

## 2019-10-28 LAB — CBC
HCT: 38.9 % (ref 36.0–46.0)
Hemoglobin: 12.3 g/dL (ref 12.0–15.0)
MCH: 29.5 pg (ref 26.0–34.0)
MCHC: 31.6 g/dL (ref 30.0–36.0)
MCV: 93.3 fL (ref 80.0–100.0)
Platelets: 300 10*3/uL (ref 150–400)
RBC: 4.17 MIL/uL (ref 3.87–5.11)
RDW: 12.3 % (ref 11.5–15.5)
WBC: 17.2 10*3/uL — ABNORMAL HIGH (ref 4.0–10.5)
nRBC: 0 % (ref 0.0–0.2)

## 2019-10-28 LAB — BASIC METABOLIC PANEL
Anion gap: 7 (ref 5–15)
BUN: 20 mg/dL (ref 6–20)
CO2: 33 mmol/L — ABNORMAL HIGH (ref 22–32)
Calcium: 8.9 mg/dL (ref 8.9–10.3)
Chloride: 98 mmol/L (ref 98–111)
Creatinine, Ser: 0.97 mg/dL (ref 0.44–1.00)
GFR calc Af Amer: >60 mL/min (ref >60)
GFR calc non Af Amer: >60 mL/min (ref >60)
Glucose, Bld: 292 mg/dL — ABNORMAL HIGH (ref 70–99)
Potassium: 4.6 mmol/L (ref 3.5–5.1)
Sodium: 138 mmol/L (ref 135–145)

## 2019-10-28 LAB — GLUCOSE, CAPILLARY
Glucose-Capillary: 263 mg/dL — ABNORMAL HIGH (ref 70–99)
Glucose-Capillary: 350 mg/dL — ABNORMAL HIGH (ref 70–99)
Glucose-Capillary: 361 mg/dL — ABNORMAL HIGH (ref 70–99)
Glucose-Capillary: 316 mg/dL — ABNORMAL HIGH (ref 70–99)

## 2019-10-28 LAB — D-DIMER, QUANTITATIVE: D-Dimer, Quant: 0.71 ug/mL-FEU — ABNORMAL HIGH (ref 0.00–0.50)

## 2019-10-28 LAB — PHOSPHORUS: Phosphorus: 2.7 mg/dL (ref 2.5–4.6)

## 2019-10-28 LAB — MAGNESIUM: Magnesium: 2.1 mg/dL (ref 1.7–2.4)

## 2019-10-28 LAB — C-REACTIVE PROTEIN: CRP: 11.2 mg/dL — ABNORMAL HIGH (ref <1.0)

## 2019-10-28 MED ORDER — INSULIN DETEMIR 100 UNIT/ML ~~LOC~~ SOLN
10.0000 [IU] | Freq: Two times a day (BID) | SUBCUTANEOUS | Status: DC
  Administered 2019-10-28 – 2019-10-29 (×3): 10 [IU] via SUBCUTANEOUS
  Filled 2019-10-28 (×4): qty 0.1

## 2019-10-28 NOTE — Progress Notes (Signed)
Pt was on 15L at the start of shift MD orders were to wean pt O2. And now pt is on 5L sats at 90-94%.

## 2019-10-28 NOTE — Progress Notes (Signed)
PROGRESS NOTE  Traci Roy WUJ:[id number] DOB: 1960-03-08 DOA: 10/21/2019 PCP: Maurice Small, MD   LOS: 6 days   Brief Narrative / Interim history: Traci Roy is an 59 y.o. female past medical history significant for obesity essential hypertension who has not gotten vaccinated started having cough and congestion went and got herself tested 3 days prior to admission on 10/19/2019 and she tested positive for COVID-19 since then her breathing has gotten significantly worse especially with exertion.  Brought into the ED found hypoxic in the 70s and placed on 2 L and she stabilized.  Hospital course complicated by progressively worsening respiratory failure despite Remdesivir and steroids, and unfortunately patient refused baricitinib  Subjective / 24h Interval events: -Keeps asking about when can she go home.  Still on 15 L this morning.  Assessment & Plan:  Principal Problem Acute Hypoxic Respiratory Failure due to Covid-19 Viral Illness -She was initially on room air at rest but her oxygen requirements have increased to 5 L on 9/18.  I have discussed with patient risks and benefits of baricitinib and she refused that medication initially. Unfortunately patient continued to get worse and her oxygen levels got to 12 L heated high flow on 9/22.  After discussion of risks/benefits she eventually agreed to baricitinib which was started on 9/22. -She was started on remdesivir and steroids, continue -Incentive spirometry, encourage being prone I-S, flutter valve -Chest x-ray on 9/22 showed worsening infiltrates concerning for superimposed bacterial pneumonia given complaints.  Given elevated WBC she was started on ceftriaxone and azithromycin at that time.  Procalcitonin minimally elevated 0.16, favor doing antibiotics for 5 days only -CRP, D-dimer starting to improve today  COVID-19 Labs  Recent Labs    10/26/19 1412 10/27/19 0547 10/28/19 0231  DDIMER 0.70* 1.56* 0.71*  CRP 14.2* 23.4*  11.2*    Lab Results  Component Value Date   SARSCOV2NAA POSITIVE (A) 10/21/2019   Active Problems Essential hypertension-not sure if he truly has hypertension as her blood pressure is normal and she is not on antihypertensives at home.,  Blood pressure slightly elevated at times but overall stable  Acute kidney injury -creatinine has now normalized at 1.0 and is remaining stable  Morbid obesity-BMI 43 -patient will benefit from weight loss  Diabetes mellitus with steroid-induced hyperglycemia-continue sliding scale while here.  Her A1c is 7.1.   CBG (last 3)  Recent Labs    10/27/19 1620 10/27/19 2136 10/28/19 0842  GLUCAP 366* 338* 263*    Scheduled Meds: . baricitinib  4 mg Oral Daily  . Chlorhexidine Gluconate Cloth  6 each Topical Q0600  . enoxaparin (LOVENOX) injection  55 mg Subcutaneous Q0600  . escitalopram  20 mg Oral Daily  . insulin aspart  0-15 Units Subcutaneous TID WC  . insulin aspart  0-5 Units Subcutaneous QHS  . insulin aspart  3 Units Subcutaneous TID WC  . methylPREDNISolone (SOLU-MEDROL) injection  80 mg Intravenous Q12H  . mupirocin ointment  1 application Nasal BID  . polyethylene glycol  17 g Oral Daily   Continuous Infusions: . azithromycin 500 mg (10/27/19 1412)  . cefTRIAXone (ROCEPHIN)  IV 1 g (10/27/19 1340)   PRN Meds:.acetaminophen, chlorpheniramine-HYDROcodone, guaiFENesin-dextromethorphan, ondansetron **OR** ondansetron (ZOFRAN) IV  DVT prophylaxis: Lovenox Code Status: Full code Family Communication: d/w husband over the phone Jerrye Beavers [phone])  Status is: Inpatient  Remains inpatient appropriate because:IV treatments appropriate due to intensity of illness or inability to take PO Dispo: The patient is from: Home  Anticipated d/c is to: Home              Anticipated d/c date is: 3 days              Patient currently is not medically stable to d/c.  Consultants:  None   Procedures:  None   Microbiology: None    Antibacterials: Ceftriaxone 9/20 >> Azithromycin 9/20 >>  Objective: Vitals:   10/27/19 1622 10/27/19 2000 10/27/19 2136 10/28/19 0845  BP: (!) 117/49  (!) 144/79 (!) 113/50  Pulse: (!) 52  (!) 56 74  Resp: 16  10   Temp: 98.3 F (36.8 C)  97.9 F (36.6 C) 97.8 F (36.6 C)  TempSrc:      SpO2: 95% 94% 99% (!) 89%  Weight:      Height:       No intake or output data in the 24 hours ending 10/28/19 1126 Filed Weights   10/22/19 0528  Weight: 115.2 kg    Examination:  Constitutional: in bed, no distress but breathing hard at times Eyes: no icterus  ENMT: mmm Neck: normal, supple Respiratory: Coarse breath sounds bilaterally, no wheezing Cardiovascular: Regular rate and rhythm no murmurs appreciated Abdomen: Soft, nontender, nondistended, bowel sounds positive Musculoskeletal: no clubbing / cyanosis.  Skin: No new rashes Neurologic: Nonfocal, equal strength, patient is ambulatory  Data Reviewed: I have independently reviewed following labs and imaging studies   CBC: Recent Labs  Lab 10/23/19 0040 10/23/19 0040 10/24/19 0150 10/25/19 1107 10/26/19 1412 10/27/19 0547 10/28/19 0231  WBC 4.3   < > 9.5 11.4* 16.3* 12.4* 17.2*  NEUTROABS 3.5  --  8.1* 10.2* 14.1* 11.1*  --   HGB 13.8   < > 13.6 13.5 12.9 13.1 12.3  HCT 41.8   < > 41.1 41.0 40.1 40.4 38.9  MCV 92.1   < > 92.6 93.6 92.6 94.6 93.3  PLT 231   < > 279 297 267 283 300   < > = values in this interval not displayed.   Basic Metabolic Panel: Recent Labs  Lab 10/24/19 0150 10/25/19 1107 10/26/19 1412 10/27/19 0547 10/28/19 0231  NA 139 138 138 138 138  K 3.9 4.2 3.9 4.3 4.6  CL 99 96* 95* 97* 98  CO2 31 30 32 33* 33*  GLUCOSE 216* 313* 202* 315* 292*  BUN 23* 21* 17 17 20   CREATININE 1.04* 1.08* 1.07* 1.03* 0.97  CALCIUM 9.1 8.9 8.4* 8.9 8.9  MG  --   --   --   --  2.1  PHOS  --   --   --   --  2.7   GFR: Estimated Creatinine Clearance: 77.8 mL/min (by C-G formula based on SCr of 0.97  mg/dL). Liver Function Tests: Recent Labs  Lab 10/23/19 0040 10/24/19 0150 10/25/19 1107 10/26/19 1412 10/27/19 0547  AST 83* 53* 33 23 20  ALT 70* 66* 67* 48* 43  ALKPHOS 58 54 51 50 54  BILITOT 0.6 0.2* 0.9 1.2 0.5  PROT 6.1* 5.7* 5.8* 5.6* 5.8*  ALBUMIN 2.9* 2.8* 2.7* 2.5* 2.4*   No results for input(s): LIPASE, AMYLASE in the last 168 hours. No results for input(s): AMMONIA in the last 168 hours. Coagulation Profile: No results for input(s): INR, PROTIME in the last 168 hours. Cardiac Enzymes: No results for input(s): CKTOTAL, CKMB, CKMBINDEX, TROPONINI in the last 168 hours. BNP (last 3 results) No results for input(s): PROBNP in the last 8760 hours. HbA1C: No results for input(s):  HGBA1C in the last 72 hours. CBG: Recent Labs  Lab 10/27/19 0751 10/27/19 1156 10/27/19 1620 10/27/19 2136 10/28/19 0842  GLUCAP 260* 330* 366* 338* 263*   Lipid Profile: No results for input(s): CHOL, HDL, LDLCALC, TRIG, CHOLHDL, LDLDIRECT in the last 72 hours. Thyroid Function Tests: No results for input(s): TSH, T4TOTAL, FREET4, T3FREE, THYROIDAB in the last 72 hours. Anemia Panel: No results for input(s): VITAMINB12, FOLATE, FERRITIN, TIBC, IRON, RETICCTPCT in the last 72 hours. Urine analysis:    Component Value Date/Time   COLORURINE YELLOW 05/24/2013 1456   APPEARANCEUR CLEAR 05/24/2013 1456   LABSPEC 1.017 05/24/2013 1456   PHURINE 7.0 05/24/2013 1456   GLUCOSEU NEGATIVE 05/24/2013 1456   HGBUR NEGATIVE 05/24/2013 1456   BILIRUBINUR NEGATIVE 05/24/2013 1456   KETONESUR NEGATIVE 05/24/2013 1456   PROTEINUR NEGATIVE 05/24/2013 1456   UROBILINOGEN 0.2 05/24/2013 1456   NITRITE NEGATIVE 05/24/2013 1456   LEUKOCYTESUR NEGATIVE 05/24/2013 1456   Sepsis Labs: Invalid input(s): PROCALCITONIN, LACTICIDVEN  Recent Results (from the past 240 hour(s))  SARS Coronavirus 2 by RT PCR (hospital order, performed in [facility] hospital lab) Nasopharyngeal Nasopharyngeal Swab      Status: Abnormal   Collection Time: 10/21/19  5:07 PM   Specimen: Nasopharyngeal Swab  Result Value Ref Range Status   SARS Coronavirus 2 POSITIVE (A) NEGATIVE Final    Comment: RESULT CALLED TO, READ BACK BY AND VERIFIED WITH: A,OLEARY @1851  10/21/19 EB (NOTE) SARS-CoV-2 target nucleic acids are DETECTED  SARS-CoV-2 RNA is generally detectable in upper respiratory specimens  during the acute phase of infection.  Positive results are indicative  of the presence of the identified virus, but do not rule out bacterial infection or co-infection with other pathogens not detected by the test.  Clinical correlation with patient history and  other diagnostic information is necessary to determine patient infection status.  The expected result is negative.  Fact Sheet for Patients:   StrictlyIdeas.no   Fact Sheet for Healthcare Providers:   BankingDealers.co.za    This test is not yet approved or cleared by the [place] FDA and  has been authorized for detection and/or diagnosis of SARS-CoV-2 by FDA under an Emergency Use Authorization (EUA).  This EUA will remain in effect (meaning this test can be  used) for the duration of  the COVID-19 declaration under Section 564(b)(1) of the Act, 21 U.S.C. section 360-bbb-3(b)(1), unless the authorization is terminated or revoked sooner.  Performed at [facility], [place] [address] [place], [place] [address]   MRSA PCR Screening     Status: Abnormal   Collection Time: 10/22/19 11:11 AM   Specimen: Nasal Mucosa; Nasopharyngeal  Result Value Ref Range Status   MRSA by PCR POSITIVE (A) NEGATIVE Final    Comment:        The GeneXpert MRSA Assay (FDA approved for NASAL specimens only), is one component of a comprehensive MRSA colonization surveillance program. It is not intended to diagnose MRSA infection nor to guide or monitor treatment for MRSA infections. CRITICAL RESULT CALLED TO,  READ BACK BY AND VERIFIED WITH: RN Paulla Fore AT 1245 10/22/19 BY MM Performed at [facility], [place] [address] [place], [place] [address]       Radiology Studies: No results found.  Marzetta Board, MD, PhD [facility]  Between 7 am - 7 pm I am available, please contact me via Amion or Securechat  Between 7 pm - 7 am I am not available, please contact night  coverage MD/APP via Amion

## 2019-10-29 LAB — BASIC METABOLIC PANEL
Anion gap: 9 (ref 5–15)
BUN: 22 mg/dL — ABNORMAL HIGH (ref 6–20)
CO2: 32 mmol/L (ref 22–32)
Calcium: 8.8 mg/dL — ABNORMAL LOW (ref 8.9–10.3)
Chloride: 99 mmol/L (ref 98–111)
Creatinine, Ser: 1.03 mg/dL — ABNORMAL HIGH (ref 0.44–1.00)
GFR calc Af Amer: >60 mL/min (ref >60)
GFR calc non Af Amer: 59 mL/min — ABNORMAL LOW (ref >60)
Glucose, Bld: 215 mg/dL — ABNORMAL HIGH (ref 70–99)
Potassium: 4.6 mmol/L (ref 3.5–5.1)
Sodium: 140 mmol/L (ref 135–145)

## 2019-10-29 LAB — GLUCOSE, CAPILLARY
Glucose-Capillary: 185 mg/dL — ABNORMAL HIGH (ref 70–99)
Glucose-Capillary: 227 mg/dL — ABNORMAL HIGH (ref 70–99)

## 2019-10-29 LAB — CBC
HCT: 38.2 % (ref 36.0–46.0)
Hemoglobin: 12.4 g/dL (ref 12.0–15.0)
MCH: 30.5 pg (ref 26.0–34.0)
MCHC: 32.5 g/dL (ref 30.0–36.0)
MCV: 94.1 fL (ref 80.0–100.0)
Platelets: 332 10*3/uL (ref 150–400)
RBC: 4.06 MIL/uL (ref 3.87–5.11)
RDW: 12.4 % (ref 11.5–15.5)
WBC: 18.3 10*3/uL — ABNORMAL HIGH (ref 4.0–10.5)
nRBC: 0 % (ref 0.0–0.2)

## 2019-10-29 MED ORDER — DEXAMETHASONE 6 MG PO TABS: 6.0000 mg | ORAL_TABLET | Freq: Every day | ORAL | 0 refills | Status: DC

## 2019-10-29 MED ORDER — CEFDINIR 300 MG PO CAPS: 300.0000 mg | ORAL_CAPSULE | Freq: Two times a day (BID) | ORAL | 0 refills | Status: DC

## 2019-10-29 MED ORDER — GUAIFENESIN-DM 100-10 MG/5ML PO SYRP: 10.0000 mL | ORAL_SOLUTION | ORAL | 0 refills | Status: DC | PRN

## 2019-10-29 MED ORDER — ALBUTEROL SULFATE HFA 108 (90 BASE) MCG/ACT IN AERS: 2.0000 | INHALATION_SPRAY | Freq: Four times a day (QID) | RESPIRATORY_TRACT | 0 refills | Status: DC | PRN

## 2019-10-29 MED ORDER — METFORMIN HCL 500 MG PO TABS: 500.0000 mg | ORAL_TABLET | Freq: Two times a day (BID) | ORAL | 0 refills | Status: DC

## 2019-10-29 NOTE — Discharge Instructions (Signed)
Follow with Maurice Small, MD in 5-7 days  You tested positive for Covid on 9/15 and you have been hospitalized, recommend 21 days of quarantine/isolation,  Please get a complete blood count and chemistry panel checked by your Primary MD at your next visit, and again as instructed by your Primary MD. Please get your medications reviewed and adjusted by your Primary MD.  Please request your Primary MD to go over all Hospital Tests and Procedure/Radiological results at the follow up, please get all Hospital records sent to your Prim MD by signing hospital release before you go home.  In some cases, there will be blood work, cultures and biopsy results pending at the time of your discharge. Please request that your primary care M.D. goes through all the records of your hospital data and follows up on these results.  If you had Pneumonia of Lung problems at the Hospital: Please get a 2 view Chest X ray done in 6-8 weeks after hospital discharge or sooner if instructed by your Primary MD.  If you have Congestive Heart Failure: Please call your Cardiologist or Primary MD anytime you have any of the following symptoms:  1) 3 pound weight gain in 24 hours or 5 pounds in 1 week  2) shortness of breath, with or without a dry hacking cough  3) swelling in the hands, feet or stomach  4) if you have to sleep on extra pillows at night in order to breathe  Follow cardiac low salt diet and 1.5 lit/day fluid restriction.  If you have diabetes Accuchecks 4 times/day, Once in AM empty stomach and then before each meal. Log in all results and show them to your primary doctor at your next visit. If any glucose reading is under 80 or above 300 call your primary MD immediately.  If you have Seizure/Convulsions/Epilepsy: Please do not drive, operate heavy machinery, participate in activities at heights or participate in high speed sports until you have seen by Primary MD or a Neurologist and advised to do so  again. Per [facility] statutes, patients with seizures are not allowed to drive until they have been seizure-free for six months.  Use caution when using heavy equipment or power tools. Avoid working on ladders or at heights. Take showers instead of baths. Ensure the water temperature is not too high on the home water heater. Do not go swimming alone. Do not lock yourself in a room alone (i.e. bathroom). When caring for infants or small children, sit down when holding, feeding, or changing them to minimize risk of injury to the child in the event you have a seizure. Maintain good sleep hygiene. Avoid alcohol.   If you had Gastrointestinal Bleeding: Please ask your Primary MD to check a complete blood count within one week of discharge or at your next visit. Your endoscopic/colonoscopic biopsies that are pending at the time of discharge, will also need to followed by your Primary MD.  Get Medicines reviewed and adjusted. Please take all your medications with you for your next visit with your Primary MD  Please request your Primary MD to go over all hospital tests and procedure/radiological results at the follow up, please ask your Primary MD to get all Hospital records sent to his/her office.  If you experience worsening of your admission symptoms, develop shortness of breath, life threatening emergency, suicidal or homicidal thoughts you must seek medical attention immediately by calling 911 or calling your MD immediately  if symptoms less severe.  You  must read complete instructions/literature along with all the possible adverse reactions/side effects for all the Medicines you take and that have been prescribed to you. Take any new Medicines after you have completely understood and accpet all the possible adverse reactions/side effects.   Do not drive or operate heavy machinery when taking Pain medications.   Do not take more than prescribed Pain, Sleep and Anxiety Medications  Special  Instructions: If you have smoked or chewed Tobacco  in the last 2 yrs please stop smoking, stop any regular Alcohol  and or any Recreational drug use.  Wear Seat belts while driving.  Please note You were cared for by a hospitalist during your hospital stay. If you have any questions about your discharge medications or the care you received while you were in the hospital after you are discharged, you can call the unit and asked to speak with the hospitalist on call if the hospitalist that took care of you is not available. Once you are discharged, your primary care physician will handle any further medical issues. Please note that NO REFILLS for any discharge medications will be authorized once you are discharged, as it is imperative that you return to your primary care physician (or establish a relationship with a primary care physician if you do not have one) for your aftercare needs so that they can reassess your need for medications and monitor your lab values.  You can reach the hospitalist office at phone [phone] or fax [phone]   If you do not have a primary care physician, you can call [phone] for a physician referral.  Activity: As tolerated with Full fall precautions use walker/cane & assistance as needed    Diet: diabetic  Disposition Home     COVID-19 COVID-19 is a respiratory infection that is caused by a virus called severe acute respiratory syndrome coronavirus 2 (SARS-CoV-2). The disease is also known as coronavirus disease or novel coronavirus. In some people, the virus may not cause any symptoms. In others, it may cause a serious infection. The infection can get worse quickly and can lead to complications, such as:  Pneumonia, or infection of the lungs.  Acute respiratory distress syndrome or ARDS. This is a condition in which fluid build-up in the lungs prevents the lungs from filling with air and passing oxygen into the blood.  Acute respiratory failure. This  is a condition in which there is not enough oxygen passing from the lungs to the body or when carbon dioxide is not passing from the lungs out of the body.  Sepsis or septic shock. This is a serious bodily reaction to an infection.  Blood clotting problems.  Secondary infections due to bacteria or fungus.  Organ failure. This is when your body's organs stop working. The virus that causes COVID-19 is contagious. This means that it can spread from person to person through droplets from coughs and sneezes (respiratory secretions). What are the causes? This illness is caused by a virus. You may catch the virus by:  Breathing in droplets from an infected person. Droplets can be spread by a person breathing, speaking, singing, coughing, or sneezing.  Touching something, like a table or a doorknob, that was exposed to the virus (contaminated) and then touching your mouth, nose, or eyes. What increases the risk? Risk for infection You are more likely to be infected with this virus if you:  Are within 6 feet (2 meters) of a person with COVID-19.  Provide care for or live with  a person who is infected with COVID-19.  Spend time in crowded indoor spaces or live in shared housing. Risk for serious illness You are more likely to become seriously ill from the virus if you:  Are 76 years of age or older. The higher your age, the more you are at risk for serious illness.  Live in a nursing home or long-term care facility.  Have cancer.  Have a long-term (chronic) disease such as: ? Chronic lung disease, including chronic obstructive pulmonary disease or asthma. ? A long-term disease that lowers your body's ability to fight infection (immunocompromised). ? Heart disease, including heart failure, a condition in which the arteries that lead to the heart become narrow or blocked (coronary artery disease), a disease which makes the heart muscle thick, weak, or stiff  (cardiomyopathy). ? Diabetes. ? Chronic kidney disease. ? Sickle cell disease, a condition in which red blood cells have an abnormal "sickle" shape. ? Liver disease.  Are obese. What are the signs or symptoms? Symptoms of this condition can range from mild to severe. Symptoms may appear any time from 2 to 14 days after being exposed to the virus. They include:  A fever or chills.  A cough.  Difficulty breathing.  Headaches, body aches, or muscle aches.  Runny or stuffy (congested) nose.  A sore throat.  New loss of taste or smell. Some people may also have stomach problems, such as nausea, vomiting, or diarrhea. Other people may not have any symptoms of COVID-19. How is this diagnosed? This condition may be diagnosed based on:  Your signs and symptoms, especially if: ? You live in an area with a COVID-19 outbreak. ? You recently traveled to or from an area where the virus is common. ? You provide care for or live with a person who was diagnosed with COVID-19. ? You were exposed to a person who was diagnosed with COVID-19.  A physical exam.  Lab tests, which may include: ? Taking a sample of fluid from the back of your nose and throat (nasopharyngeal fluid), your nose, or your throat using a swab. ? A sample of mucus from your lungs (sputum). ? Blood tests.  Imaging tests, which may include, X-rays, CT scan, or ultrasound. How is this treated? At present, there is no medicine to treat COVID-19. Medicines that treat other diseases are being used on a trial basis to see if they are effective against COVID-19. Your health care provider will talk with you about ways to treat your symptoms. For most people, the infection is mild and can be managed at home with rest, fluids, and over-the-counter medicines. Treatment for a serious infection usually takes places in a hospital intensive care unit (ICU). It may include one or more of the following treatments. These treatments are  given until your symptoms improve.  Receiving fluids and medicines through an IV.  Supplemental oxygen. Extra oxygen is given through a tube in the nose, a face mask, or a hood.  Positioning you to lie on your stomach (prone position). This makes it easier for oxygen to get into the lungs.  Continuous positive airway pressure (CPAP) or bi-level positive airway pressure (BPAP) machine. This treatment uses mild air pressure to keep the airways open. A tube that is connected to a motor delivers oxygen to the body.  Ventilator. This treatment moves air into and out of the lungs by using a tube that is placed in your windpipe.  Tracheostomy. This is a procedure to create a  hole in the neck so that a breathing tube can be inserted.  Extracorporeal membrane oxygenation (ECMO). This procedure gives the lungs a chance to recover by taking over the functions of the heart and lungs. It supplies oxygen to the body and removes carbon dioxide. Follow these instructions at home: Lifestyle  If you are sick, stay home except to get medical care. Your health care provider will tell you how long to stay home. Call your health care provider before you go for medical care.  Rest at home as told by your health care provider.  Do not use any products that contain nicotine or tobacco, such as cigarettes, e-cigarettes, and chewing tobacco. If you need help quitting, ask your health care provider.  Return to your normal activities as told by your health care provider. Ask your health care provider what activities are safe for you. General instructions  Take over-the-counter and prescription medicines only as told by your health care provider.  Drink enough fluid to keep your urine pale yellow.  Keep all follow-up visits as told by your health care provider. This is important. How is this prevented?  There is no vaccine to help prevent COVID-19 infection. However, there are steps you can take to protect  yourself and others from this virus. To protect yourself:   Do not travel to areas where COVID-19 is a risk. The areas where COVID-19 is reported change often. To identify high-risk areas and travel restrictions, check the CDC travel website: FatFares.com.br  If you live in, or must travel to, an area where COVID-19 is a risk, take precautions to avoid infection. ? Stay away from people who are sick. ? Wash your hands often with soap and water for 20 seconds. If soap and water are not available, use an alcohol-based hand sanitizer. ? Avoid touching your mouth, face, eyes, or nose. ? Avoid going out in public, follow guidance from your state and local health authorities. ? If you must go out in public, wear a cloth face covering or face mask. Make sure your mask covers your nose and mouth. ? Avoid crowded indoor spaces. Stay at least 6 feet (2 meters) away from others. ? Disinfect objects and surfaces that are frequently touched every day. This may include:  Counters and tables.  Doorknobs and light switches.  Sinks and faucets.  Electronics, such as phones, remote controls, keyboards, computers, and tablets. To protect others: If you have symptoms of COVID-19, take steps to prevent the virus from spreading to others.  If you think you have a COVID-19 infection, contact your health care provider right away. Tell your health care team that you think you may have a COVID-19 infection.  Stay home. Leave your house only to seek medical care. Do not use public transport.  Do not travel while you are sick.  Wash your hands often with soap and water for 20 seconds. If soap and water are not available, use alcohol-based hand sanitizer.  Stay away from other members of your household. Let healthy household members care for children and pets, if possible. If you have to care for children or pets, wash your hands often and wear a mask. If possible, stay in your own room, separate  from others. Use a different bathroom.  Make sure that all people in your household wash their hands well and often.  Cough or sneeze into a tissue or your sleeve or elbow. Do not cough or sneeze into your hand or into the air.  Wear a cloth face covering or face mask. Make sure your mask covers your nose and mouth. Where to find more information  Centers for Disease Control and Prevention: PurpleGadgets.be  World Health Organization: https://www.castaneda.info/ Contact a health care provider if:  You live in or have traveled to an area where COVID-19 is a risk and you have symptoms of the infection.  You have had contact with someone who has COVID-19 and you have symptoms of the infection. Get help right away if:  You have trouble breathing.  You have pain or pressure in your chest.  You have confusion.  You have bluish lips and fingernails.  You have difficulty waking from sleep.  You have symptoms that get worse. These symptoms may represent a serious problem that is an emergency. Do not wait to see if the symptoms will go away. Get medical help right away. Call your local emergency services (911 in the [place] Do not drive yourself to the hospital. Let the emergency medical personnel know if you think you have COVID-19. Summary  COVID-19 is a respiratory infection that is caused by a virus. It is also known as coronavirus disease or novel coronavirus. It can cause serious infections, such as pneumonia, acute respiratory distress syndrome, acute respiratory failure, or sepsis.  The virus that causes COVID-19 is contagious. This means that it can spread from person to person through droplets from breathing, speaking, singing, coughing, or sneezing.  You are more likely to develop a serious illness if you are 33 years of age or older, have a weak immune system, live in a nursing home, or have chronic disease.  There is no medicine to treat  COVID-19. Your health care provider will talk with you about ways to treat your symptoms.  Take steps to protect yourself and others from infection. Wash your hands often and disinfect objects and surfaces that are frequently touched every day. Stay away from people who are sick and wear a mask if you are sick. This information is not intended to replace advice given to you by your health care provider. Make sure you discuss any questions you have with your health care provider. Document Revised: 11/21/2018 Document Reviewed: 02/27/2018 Elsevier Patient Education  2020 [place].  COVID-19: How to Protect Yourself and Others Know how it spreads  There is currently no vaccine to prevent coronavirus disease 2019 (COVID-19).  The best way to prevent illness is to avoid being exposed to this virus.  The virus is thought to spread mainly from person-to-person. ? Between people who are in close contact with one another (within about 6 feet). ? Through respiratory droplets produced when an infected person coughs, sneezes or talks. ? These droplets can land in the mouths or noses of people who are nearby or possibly be inhaled into the lungs. ? COVID-19 may be spread by people who are not showing symptoms. Everyone should Clean your hands often  Wash your hands often with soap and water for at least 20 seconds especially after you have been in a public place, or after blowing your nose, coughing, or sneezing.  If soap and water are not readily available, use a hand sanitizer that contains at least 60% alcohol. Cover all surfaces of your hands and rub them together until they feel dry.  Avoid touching your eyes, nose, and mouth with unwashed hands. Avoid close contact  Limit contact with others as much as possible.  Avoid close contact with people who are sick.  Put distance  between yourself and other people. ? Remember that some people without symptoms may be able to spread  virus. ? This is especially important for people who are at higher risk of getting very GainPain.com.cy Cover your mouth and nose with a mask when around others  You could spread COVID-19 to others even if you do not feel sick.  Everyone should wear a mask in public settings and when around people not living in their household, especially when social distancing is difficult to maintain. ? Masks should not be placed on young children under age 58, anyone who has trouble breathing, or is unconscious, incapacitated or otherwise unable to remove the mask without assistance.  The mask is meant to protect other people in case you are infected.  Do NOT use a facemask meant for a Dietitian.  Continue to keep about 6 feet between yourself and others. The mask is not a substitute for social distancing. Cover coughs and sneezes  Always cover your mouth and nose with a tissue when you cough or sneeze or use the inside of your elbow.  Throw used tissues in the trash.  Immediately wash your hands with soap and water for at least 20 seconds. If soap and water are not readily available, clean your hands with a hand sanitizer that contains at least 60% alcohol. Clean and disinfect  Clean AND disinfect frequently touched surfaces daily. This includes tables, doorknobs, light switches, countertops, handles, desks, phones, keyboards, toilets, faucets, and sinks. RackRewards.fr  If surfaces are dirty, clean them: Use detergent or soap and water prior to disinfection.  Then, use a household disinfectant. You can see a list of EPA-registered household disinfectants here. michellinders.com 10/08/2018 This information is not intended to replace advice given to you by your health care provider. Make sure you discuss any questions you have with your health care  provider. Document Revised: 10/16/2018 Document Reviewed: 08/14/2018 Elsevier Patient Education  [place].

## 2019-10-29 NOTE — TOC Transition Note (Signed)
Transition of Care [facility]) - CM/SW Discharge Note   Patient Details  Name: Traci Roy MRN: [id number] Date of Birth: [birth year]  [facility] CM/SW Contact:  Angelita Ingles, RN Phone Number: [phone]  10/29/2019, 12:07 PM   Clinical Narrative:  CM consulted for home O2 set up. Home oxygen has been set up through [facility]. [employer] will notify patient and set up home delivery. Portable tank has been delivered to the unit. No other needs noted at this time. CM will sign off.     Final next level of care: Home/Self Care Barriers to Discharge: No Barriers Identified   Patient Goals and CMS Choice Patient states their goals for this hospitalization and ongoing recovery are:: Wants to go home      Discharge Placement                       Discharge Plan and Services                DME Arranged: Oxygen DME Agency: NA ([place]) Date DME Agency Contacted: 10/29/19 Time DME Agency Contacted: [id number] Representative spoke with at DME Agency: Brenton Grills HH Arranged: NA [place] Agency Agency: NA        Social Determinants of Health ([place]) Interventions     Readmission Risk Interventions No flowsheet data found.

## 2019-10-29 NOTE — Discharge Summary (Signed)
Physician Discharge Summary  Traci Roy XNA:[id number] DOB: 1960/11/28 DOA: 10/21/2019  PCP: Maurice Small, MD  Admit date: 10/21/2019 Discharge date: 10/29/2019  Admitted From: home Disposition:  home  Recommendations for Outpatient Follow-up:  1. Follow up with PCP in 1-2 weeks  Home Health: none Equipment/Devices: home O2  Discharge Condition: stable CODE STATUS: Full code Diet recommendation: diabetic   HPI: Per admitting MD, Traci Roy is a 59 y.o. female with medical history significant of HTN and obesity. Pt didn't get COVID vaccine. Pt diagnosed with COVID-19 x3 days ago.  Having cough and congestion. Breathing worsened today significantly with increased SOB.  She has been monitoring O2 sats at home, these are now going into the low 80s which prompted her to call her PCP who in turn told her to call EMS.  Hospital Course / Discharge diagnoses: Principal Problem Acute Hypoxic Respiratory Failure due to Covid-19 Viral Illness -patient was admitted to the hospital with COVID-19 pneumonia.  On admission she was on room air however her oxygen needs rapidly escalated on 9/18.  Baricitinib was offered to the patient however she initially refused.  Her oxygen requirements continued to increase and eventually reached 15 L high flow on 9/20, and eventually patient agreed to baricitinib which was started.  Repeat chest x-ray showed worsening infiltrates concerning for superimposed bacterial pneumonia and she was started on antibiotics.  She eventually improved, was able to be weaned off to 5 L, has remained stable, clinically she states that she is returned to baseline, wanting to go home, she is able to ambulate in the hallway without significant shortness of breath.  She will be discharged home on oxygen, 3 more days of steroids along with antibiotics.  She was advised to follow-up with PCP within 1 to 2 weeks.  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  Start Metformin  Discharge Instructions   Allergies as of 10/29/2019      Reactions   Lisinopril Cough   Adhesive [tape] Itching, Rash   Itching and breaks out      Medication List    STOP taking these medications   azithromycin 250 MG tablet Commonly known as: ZITHROMAX   predniSONE 20 MG tablet Commonly known as: DELTASONE     TAKE these medications   albuterol 108 (90 Base) MCG/ACT inhaler Commonly known as: VENTOLIN HFA Inhale 2 puffs into the lungs every 6 (six) hours as needed for wheezing or shortness of breath.   ALPRAZolam 0.25 MG tablet Commonly known as: XANAX Take 0.25 mg by mouth 2 (two) times daily as needed for anxiety.   cefdinir 300 MG capsule Commonly known as: OMNICEF Take 1 capsule (300 mg total) by mouth 2 (two) times daily.   dexamethasone 6 MG tablet Commonly known as: DECADRON Take 1 tablet (6 mg total) by mouth daily.   escitalopram 20 MG tablet Commonly known as: LEXAPRO Take 20 mg by mouth daily.   guaiFENesin-dextromethorphan 100-10 MG/5ML syrup Commonly known as: ROBITUSSIN DM Take 10 mLs by mouth every 4 (four) hours as needed for cough.  hydrochlorothiazide 25 MG tablet Commonly known as: HYDRODIURIL Take 25 mg by mouth daily.   ibuprofen 200 MG tablet Commonly known as: ADVIL Take 200-600 mg by mouth every 6 (six) hours as needed for headache or moderate pain.   magnesium oxide 400 MG tablet Commonly known as: MAG-OX Take 400-800 mg by mouth at bedtime as needed (restless legs).   metFORMIN 500 MG  tablet Commonly known as: Glucophage Take 1 tablet (500 mg total) by mouth 2 (two) times daily with a meal.   multivitamin with minerals tablet Take 1 tablet by mouth daily.   ondansetron 8 MG tablet Commonly known as: ZOFRAN Take 8 mg by mouth every 8 (eight) hours as needed for nausea/vomiting.            Durable Medical Equipment  (From admission, onward)         Start     Ordered   10/29/19 1051  For home use only DME oxygen  Once       Question Answer Comment  Length of Need 6 Months   Mode or (Route) Nasal cannula   Liters per Minute 5   Frequency Continuous (stationary and portable oxygen unit needed)   Oxygen delivery system Gas      10/29/19 1050          Follow-up Information    Maurice Small, MD. Schedule an appointment as soon as possible for a visit in 2 week(s).   Specialty: Family Medicine Contact information: [address] [place] [address] [phone]               Consultations:  None   Procedures/Studies:  DG CHEST PORT 1 VIEW  Result Date: 10/26/2019 CLINICAL DATA:  Shortness of breath.  Reported COVID-19 positive EXAM: PORTABLE CHEST 1 VIEW COMPARISON:  October 21, 2019 FINDINGS: There is airspace consolidation in the right mid and lower lung regions, significantly increased from most recent study. There is localized volume loss in the right mid lung region. There is subtle airspace opacity in the left mid lung, stable. Heart is upper normal in size with pulmonary vascularity normal. No adenopathy. No bone lesions. IMPRESSION: There is now increase in airspace opacity with consolidation in in portions of the right mid and lower lung regions with localized volume loss in the right mid lung region. Subtle airspace opacity in the left mid lung is stable. Stable cardiac silhouette. Question bacterial superinfection on atypical organism pneumonia given the appearance on the right. Electronically Signed   By: Lowella Grip III M.D.   On: 10/26/2019 08:06   DG Chest Portable 1 View  Result Date: 10/21/2019 CLINICAL DATA:  COVID, shortness of breath EXAM: PORTABLE CHEST 1 VIEW COMPARISON:  Chest x-ray 04/09/2013. FINDINGS: The heart size and mediastinal contours are within normal limits. Hazy patchy airspace opacities that are more prominent peripherally within the left mid lung zone and right mid upper lung. No pulmonary edema. No pleural effusion. No pneumothorax. No acute osseous abnormality. IMPRESSION: Findings consistent with COVID-19 infection. Electronically Signed   By: Iven Finn M.D.   On: 10/21/2019 17:27     Subjective: - no chest pain, shortness of breath, no abdominal pain, nausea or vomiting.   Discharge Exam: BP (!) 158/81 (BP Location: Right Wrist)   Pulse 60   Temp 98.3 F (36.8 C) (Oral)   Resp 20   Ht 5\' 4"  (1.626 m) Comment: per pt  Wt 115.2 kg   LMP 02/05/2014  SpO2 100%   BMI 43.60 kg/m   General: Pt is alert, awake, not in acute distress Cardiovascular: RRR, S1/S2 +, no rubs, no gallops Respiratory: CTA bilaterally, no wheezing, no rhonchi Abdominal: Soft, NT, ND, bowel sounds + Extremities: no edema, no cyanosis    The results of significant diagnostics from this hospitalization (including imaging, microbiology, ancillary and laboratory) are listed below for reference.     Microbiology: Recent Results (from the past 240 hour(s))  SARS Coronavirus 2 by RT PCR (hospital order, performed in [facility] hospital lab) Nasopharyngeal Nasopharyngeal Swab     Status: Abnormal   Collection Time: 10/21/19  5:07 PM   Specimen: Nasopharyngeal Swab  Result Value Ref Range Status   SARS Coronavirus 2 POSITIVE (A) NEGATIVE Final    Comment: RESULT CALLED TO, READ BACK BY AND VERIFIED WITH: A,OLEARY @1851  10/21/19 EB (NOTE) SARS-CoV-2 target nucleic acids are DETECTED  SARS-CoV-2 RNA is generally detectable in upper respiratory specimens  during the acute phase of  infection.  Positive results are indicative  of the presence of the identified virus, but do not rule out bacterial infection or co-infection with other pathogens not detected by the test.  Clinical correlation with patient history and  other diagnostic information is necessary to determine patient infection status.  The expected result is negative.  Fact Sheet for Patients:   StrictlyIdeas.no   Fact Sheet for Healthcare Providers:   BankingDealers.co.za    This test is not yet approved or cleared by the [place] FDA and  has been authorized for detection and/or diagnosis of SARS-CoV-2 by FDA under an Emergency Use Authorization (EUA).  This EUA will remain in effect (meaning this test can be  used) for the duration of  the COVID-19 declaration under Section 564(b)(1) of the Act, 21 U.S.C. section 360-bbb-3(b)(1), unless the authorization is terminated or revoked sooner.  Performed at [facility], [place] [address] [place], [place] [address]   MRSA PCR Screening     Status: Abnormal   Collection Time: 10/22/19 11:11 AM   Specimen: Nasal Mucosa; Nasopharyngeal  Result Value Ref Range Status   MRSA by PCR POSITIVE (A) NEGATIVE Final    Comment:        The GeneXpert MRSA Assay (FDA approved for NASAL specimens only), is one component of a comprehensive MRSA colonization surveillance program. It is not intended to diagnose MRSA infection nor to guide or monitor treatment for MRSA infections. CRITICAL RESULT CALLED TO, READ BACK BY AND VERIFIED WITH: RN Paulla Fore AT 7106 10/22/19 BY MM Performed at [facility], [place] [address] [place], [place] [address]      Labs: Basic Metabolic Panel: Recent Labs  Lab 10/25/19 1107 10/26/19 1412 10/27/19 0547 10/28/19 0231 10/29/19 0212  NA 138 138 138 138 140  K 4.2 3.9 4.3 4.6 4.6  CL 96* 95* 97* 98 99  CO2 30 32 33* 33* 32  GLUCOSE 313* 202* 315* 292* 215*  BUN 21*  17 17 20  22*  CREATININE 1.08* 1.07* 1.03* 0.97 1.03*  CALCIUM 8.9 8.4* 8.9 8.9 8.8*  MG  --   --   --  2.1  --   PHOS  --   --   --  2.7  --    Liver Function Tests: Recent Labs  Lab 10/23/19 0040 10/24/19 0150 10/25/19 1107 10/26/19 1412 10/27/19 0547  AST 83* 53* 33 23 20  ALT 70* 66* 67* 48* 43  ALKPHOS 58 54 51  50 54  BILITOT 0.6 0.2* 0.9 1.2 0.5  PROT 6.1* 5.7* 5.8* 5.6* 5.8*  ALBUMIN 2.9* 2.8* 2.7* 2.5* 2.4*   CBC: Recent Labs  Lab 10/23/19 0040 10/23/19 0040 10/24/19 0150 10/24/19 0150 10/25/19 1107 10/26/19 1412 10/27/19 0547 10/28/19 0231 10/29/19 0212  WBC 4.3   < > 9.5   < > 11.4* 16.3* 12.4* 17.2* 18.3*  NEUTROABS 3.5  --  8.1*  --  10.2* 14.1* 11.1*  --   --   HGB 13.8   < > 13.6   < > 13.5 12.9 13.1 12.3 12.4  HCT 41.8   < > 41.1   < > 41.0 40.1 40.4 38.9 38.2  MCV 92.1   < > 92.6   < > 93.6 92.6 94.6 93.3 94.1  PLT 231   < > 279   < > 297 267 283 300 332   < > = values in this interval not displayed.   CBG: Recent Labs  Lab 10/28/19 0842 10/28/19 1209 10/28/19 1637 10/28/19 2144 10/29/19 0838  GLUCAP 263* 350* 361* 316* 185*   Hgb A1c No results for input(s): HGBA1C in the last 72 hours. Lipid Profile No results for input(s): CHOL, HDL, LDLCALC, TRIG, CHOLHDL, LDLDIRECT in the last 72 hours. Thyroid function studies No results for input(s): TSH, T4TOTAL, T3FREE, THYROIDAB in the last 72 hours.  Invalid input(s): FREET3 Urinalysis    Component Value Date/Time   COLORURINE YELLOW 05/24/2013 [place] 05/24/2013 1456   LABSPEC 1.017 05/24/2013 1456   PHURINE 7.0 05/24/2013 1456   GLUCOSEU NEGATIVE 05/24/2013 1456   HGBUR NEGATIVE 05/24/2013 1456   BILIRUBINUR NEGATIVE 05/24/2013 1456   KETONESUR NEGATIVE 05/24/2013 1456   PROTEINUR NEGATIVE 05/24/2013 1456   UROBILINOGEN 0.2 05/24/2013 1456   NITRITE NEGATIVE 05/24/2013 1456   LEUKOCYTESUR NEGATIVE 05/24/2013 1456    FURTHER DISCHARGE INSTRUCTIONS:   Get  Medicines reviewed and adjusted: Please take all your medications with you for your next visit with your Primary MD   Laboratory/radiological data: Please request your Primary MD to go over all hospital tests and procedure/radiological results at the follow up, please ask your Primary MD to get all Hospital records sent to his/her office.   In some cases, they will be blood work, cultures and biopsy results pending at the time of your discharge. Please request that your primary care M.D. goes through all the records of your hospital data and follows up on these results.   Also Note the following: If you experience worsening of your admission symptoms, develop shortness of breath, life threatening emergency, suicidal or homicidal thoughts you must seek medical attention immediately by calling 911 or calling your MD immediately  if symptoms less severe.   You must read complete instructions/literature along with all the possible adverse reactions/side effects for all the Medicines you take and that have been prescribed to you. Take any new Medicines after you have completely understood and accpet all the possible adverse reactions/side effects.    Do not drive when taking Pain medications or sleeping medications (Benzodaizepines)   Do not take more than prescribed Pain, Sleep and Anxiety Medications. It is not advisable to combine anxiety,sleep and pain medications without talking with your primary care practitioner   Special Instructions: If you have smoked or chewed Tobacco  in the last 2 yrs please stop smoking, stop any regular Alcohol  and or any Recreational drug use.   Wear Seat belts while driving.   Please note:  You were cared for by a hospitalist during your hospital stay. Once you are discharged, your primary care physician will handle any further medical issues. Please note that NO REFILLS for any discharge medications will be authorized once you are discharged, as it is imperative  that you return to your primary care physician (or establish a relationship with a primary care physician if you do not have one) for your post hospital discharge needs so that they can reassess your need for medications and monitor your lab values.  Time coordinating discharge: 40 minutes  SIGNED:  Marzetta Board, MD, PhD 10/29/2019, 11:02 AM

## 2019-10-29 NOTE — Progress Notes (Signed)
O2 sat at rest on room air:85% O2 sat on exertion on room air: 78% O2 sat at rest on 5L Nasal Cannula 95% O2 sat on exertion on 5L Nasal Cannula 91%

## 2019-11-03 DIAGNOSIS — U071 COVID-19: Secondary | ICD-10-CM | POA: Diagnosis not present

## 2019-11-03 DIAGNOSIS — Z09 Encounter for follow-up examination after completed treatment for conditions other than malignant neoplasm: Secondary | ICD-10-CM | POA: Diagnosis not present

## 2019-11-10 DIAGNOSIS — R5382 Chronic fatigue, unspecified: Secondary | ICD-10-CM | POA: Diagnosis not present

## 2019-11-10 DIAGNOSIS — R110 Nausea: Secondary | ICD-10-CM | POA: Diagnosis not present

## 2019-11-10 DIAGNOSIS — U071 COVID-19: Secondary | ICD-10-CM | POA: Diagnosis not present

## 2019-11-17 DIAGNOSIS — U071 COVID-19: Secondary | ICD-10-CM | POA: Diagnosis not present

## 2019-11-30 DIAGNOSIS — R0600 Dyspnea, unspecified: Secondary | ICD-10-CM | POA: Diagnosis not present

## 2019-12-01 ENCOUNTER — Other Ambulatory Visit (HOSPITAL_COMMUNITY): Payer: Self-pay | Admitting: Family Medicine

## 2019-12-01 DIAGNOSIS — R0600 Dyspnea, unspecified: Secondary | ICD-10-CM

## 2019-12-01 DIAGNOSIS — R0609 Other forms of dyspnea: Secondary | ICD-10-CM

## 2019-12-02 ENCOUNTER — Other Ambulatory Visit: Payer: Self-pay

## 2019-12-02 ENCOUNTER — Ambulatory Visit (HOSPITAL_COMMUNITY): Payer: BC Managed Care – PPO | Attending: Cardiovascular Disease

## 2019-12-02 DIAGNOSIS — R0609 Other forms of dyspnea: Secondary | ICD-10-CM

## 2019-12-02 DIAGNOSIS — R0600 Dyspnea, unspecified: Secondary | ICD-10-CM | POA: Insufficient documentation

## 2019-12-02 LAB — ECHOCARDIOGRAM COMPLETE
Area-P 1/2: 5.42 cm2
S' Lateral: 3.7 cm

## 2019-12-07 ENCOUNTER — Other Ambulatory Visit: Payer: Self-pay | Admitting: Family Medicine

## 2019-12-07 DIAGNOSIS — R0609 Other forms of dyspnea: Secondary | ICD-10-CM

## 2019-12-07 DIAGNOSIS — R0600 Dyspnea, unspecified: Secondary | ICD-10-CM

## 2019-12-10 ENCOUNTER — Encounter: Payer: Self-pay | Admitting: *Deleted

## 2019-12-10 ENCOUNTER — Ambulatory Visit: Payer: BC Managed Care – PPO | Admitting: Cardiology

## 2019-12-10 ENCOUNTER — Other Ambulatory Visit: Payer: Self-pay

## 2019-12-10 ENCOUNTER — Encounter: Payer: Self-pay | Admitting: Cardiology

## 2019-12-10 DIAGNOSIS — R0789 Other chest pain: Secondary | ICD-10-CM | POA: Diagnosis not present

## 2019-12-10 DIAGNOSIS — R0602 Shortness of breath: Secondary | ICD-10-CM | POA: Diagnosis not present

## 2019-12-10 DIAGNOSIS — Z01812 Encounter for preprocedural laboratory examination: Secondary | ICD-10-CM | POA: Diagnosis not present

## 2019-12-10 MED ORDER — ASPIRIN EC 81 MG PO TBEC: 81.0000 mg | DELAYED_RELEASE_TABLET | Freq: Every day | ORAL | 3 refills | Status: DC

## 2019-12-10 MED ORDER — METOPROLOL SUCCINATE ER 25 MG PO TB24: 25.0000 mg | ORAL_TABLET | Freq: Every day | ORAL | 3 refills | Status: DC

## 2019-12-10 MED ORDER — LOSARTAN POTASSIUM 25 MG PO TABS: 25.0000 mg | ORAL_TABLET | Freq: Every day | ORAL | 3 refills | Status: DC

## 2019-12-10 NOTE — Patient Instructions (Addendum)
Medication Instructions:  Please start Losartan 25 mg a day and  Metoprolol Succinate 25 mg a day. Take Aspirin 81 mg a day. Continue all other medications as listed.  *If you need a refill on your cardiac medications before your next appointment, please call your pharmacy*  Lab Work: Please have blood work [facility]) on Monday 11/8.  If you have labs (blood work) drawn today and your tests are completely normal, you will receive your results only by: Marland Kitchen MyChart Message (if you have MyChart) OR . A paper copy in the mail If you have any lab test that is abnormal or we need to change your treatment, we will call you to review the results.   Testing/Procedures: Your physician has requested that you have a cardiac catheterization. Cardiac catheterization is used to diagnose and/or treat various heart conditions. Doctors may recommend this procedure for a number of different reasons. The most common reason is to evaluate chest pain. Chest pain can be a symptom of coronary artery disease (CAD), and cardiac catheterization can show whether plaque is narrowing or blocking your heart's arteries. This procedure is also used to evaluate the valves, as well as measure the blood flow and oxygen levels in different parts of your heart. For further information please visit HugeFiesta.tn. Please follow instruction sheet, as given.  Follow-Up: At [facility], you and your health needs are our priority.  As part of our continuing mission to provide you with exceptional heart care, we have created designated Provider Care Teams.  These Care Teams include your primary Cardiologist (physician) and Advanced Practice Providers (APPs -  Physician Assistants and Nurse Practitioners) who all work together to provide you with the care you need, when you need it.  We recommend signing up for the patient portal called "MyChart".  Sign up information is provided on this After Visit Summary.  MyChart is used to  connect with patients for Virtual Visits (Telemedicine).  Patients are able to view lab/test results, encounter notes, upcoming appointments, etc.  Non-urgent messages can be sent to your provider as well.   To learn more about what you can do with MyChart, go to [email or web address].    Your next appointment:   2 - 3 week(s)  The format for your next appointment:   In Person  Provider:   Candee Furbish, MD   Thank you for choosing [facility]!!

## 2019-12-10 NOTE — H&P (View-Only) (Signed)
Cardiology Office Note:    Date:  12/10/2019   ID:  Traci Roy, DOB Sep 25, 1960, MRN [id number]  PCP:  Maurice Small, MD  [facility] Cardiologist:  Candee Furbish, MD  [facility] Electrophysiologist:  None   Referring MD: Maurice Small, MD     History of Present Illness:    Traci Roy is a 59 y.o. female here for the evaluation of dyspnea on exertion and chest discomfort post COVID.  An echocardiogram was performed that showed a mild to moderately reduced EF of 40 to 45%.  She was diagnosed with Covid and admitted to the hospital on 10/19/2019.  She was discharged on 10/29/2019.  She was given antibiotics for superimposed pneumonia.  Discharged on home oxygen now weaned off.  Since her post hospital follow-up visit, she has been having continued shortness of breath as well as chest discomfort with exertion, quite significant.   No tob, no DM (borderline). Father has CAD, stent.  +HTN, usually higher than detected today.   She also has past medical history of chronic fatigue anxiety depression.  Hospital data reviewed, discharge summary reviewed, chest x-ray reviewed.  Past Medical History:  Diagnosis Date  . Arthritis   . Depression    takes Lexapro daily  . Dizziness    occasionally and no meds  . Flu    end of Dec 2014  . GERD (gastroesophageal reflux disease)    occasionally and no meds required  . History of bronchitis    last time many yrs ago  . History of duodenal ulcer   . History of shingles 3-5 YRS AGO  . Hypertension    takes Losartan and HCTZ daily  . Joint pain   . Restless legs     Past Surgical History:  Procedure Laterality Date  . BREAST ENHANCEMENT SURGERY Bilateral   . CESAREAN SECTION     X 1  . COLONOSCOPY    . enlarged bladder as a child     HAD SURGERY TO ENLARGE BLADDER  . TONSILLECTOMY    . TOTAL HIP ARTHROPLASTY Right 04/14/2013   Procedure: TOTAL HIP ARTHROPLASTY ANTERIOR APPROACH;  Surgeon: Hessie Dibble, MD;   Location: [place];  Service: Orthopedics;  Laterality: Right;    Current Medications: Current Meds  Medication Sig  . escitalopram (LEXAPRO) 20 MG tablet Take 20 mg by mouth daily.  Marland Kitchen guaiFENesin-dextromethorphan (ROBITUSSIN DM) 100-10 MG/5ML syrup Take 10 mLs by mouth every 4 (four) hours as needed for cough.  . hydrochlorothiazide (HYDRODIURIL) 25 MG tablet Take 25 mg by mouth daily.  Marland Kitchen ibuprofen (ADVIL,MOTRIN) 200 MG tablet Take 200-600 mg by mouth every 6 (six) hours as needed for headache or moderate pain.  . magnesium oxide (MAG-OX) 400 MG tablet Take 400-800 mg by mouth at bedtime as needed (restless legs).     Allergies:   Lisinopril and Adhesive [tape]   Social History   Socioeconomic History  . Marital status: Married    Spouse name: Not on file  . Number of children: Not on file  . Years of education: Not on file  . Highest education level: Not on file  Occupational History  . Not on file  Tobacco Use  . Smoking status: Never Smoker  . Smokeless tobacco: Never Used  Substance and Sexual Activity  . Alcohol use: Yes    Comment: glass of wine every now and then  . Drug use: No  . Sexual activity: Yes    Birth control/protection: Other-see comments  Other Topics Concern  . Not on file  Social History Narrative  . Not on file   Social Determinants of Health   Financial Resource Strain:   . Difficulty of Paying Living Expenses: Not on file  Food Insecurity:   . Worried About Charity fundraiser in the Last Year: Not on file  . Ran Out of Food in the Last Year: Not on file  Transportation Needs:   . Lack of Transportation (Medical): Not on file  . Lack of Transportation (Non-Medical): Not on file  Physical Activity:   . Days of Exercise per Week: Not on file  . Minutes of Exercise per Session: Not on file  Stress:   . Feeling of Stress : Not on file  Social Connections:   . Frequency of Communication with Friends and Family: Not on file  . Frequency of  Social Gatherings with Friends and Family: Not on file  . Attends Religious Services: Not on file  . Active Member of Clubs or Organizations: Not on file  . Attends Archivist Meetings: Not on file  . Marital Status: Not on file     Family History: See above  ROS:   Please see the history of present illness.    Tremor in hands more since COVID. All other systems reviewed and are negative.  EKGs/Labs/Other Studies Reviewed:    The following studies were reviewed today:  Echocardiogram 12/02/2019: 1. Left ventricular ejection fraction, by estimation, is 40 to 45%. The  left ventricle has mildly decreased function. The left ventricle  demonstrates global hypokinesis. The left ventricular internal cavity size  was mildly dilated. Left ventricular  diastolic parameters were normal.  2. Right ventricular systolic function is normal. The right ventricular  size is normal. There is normal pulmonary artery systolic pressure.  3. Left atrial size was mildly dilated.  4. The mitral valve is normal in structure. No evidence of mitral valve  regurgitation. No evidence of mitral stenosis.  5. The aortic valve is tricuspid. Aortic valve regurgitation is not  visualized. Mild aortic valve sclerosis is present, with no evidence of  aortic valve stenosis.  6. The inferior vena cava is normal in size with greater than 50%  respiratory variability, suggesting right atrial pressure of 3 mmHg.   EKG: EKG today on 12/10/2019 shows sinus rhythm with poor R wave progression-prior EKG from 10/22/2019 shows sinus rhythm 76 bpm with poor R wave progression  Recent Labs: 10/27/2019: ALT 43 10/28/2019: Magnesium 2.1 10/29/2019: BUN 22; Creatinine, Ser 1.03; Hemoglobin 12.4; Platelets 332; Potassium 4.6; Sodium 140  Recent Lipid Panel No results found for: CHOL, TRIG, HDL, CHOLHDL, VLDL, LDLCALC, LDLDIRECT    Physical Exam:    VS:  BP 126/80   Pulse 97   Ht 5\' 4"  (1.626 m)   Wt 242 lb  (109.8 kg)   LMP 02/05/2014   SpO2 95%   BMI 41.54 kg/m     Wt Readings from Last 3 Encounters:  12/10/19 242 lb (109.8 kg)  10/22/19 254 lb (115.2 kg)  08/02/15 249 lb 6.4 oz (113.1 kg)     GEN:  Well nourished, well developed in no acute distress HEENT: Normal NECK: No JVD; No carotid bruits LYMPHATICS: No lymphadenopathy CARDIAC: RRR, no murmurs, rubs, gallops RESPIRATORY:  Clear to auscultation without rales, wheezing or rhonchi  ABDOMEN: Soft, non-tender, non-distended MUSCULOSKELETAL:  No edema; No deformity  SKIN: Warm and dry NEUROLOGIC:  Alert and oriented x 3 PSYCHIATRIC:  Normal affect  ASSESSMENT:    1. Pre-procedure lab exam   2. Shortness of breath   3. 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  Discharged on 10/29/2019. There for a few weeks.  --Has tightness when needs to breathe deep or with exertion. Parking to building for instance causes increased shortness of breath, sensation of chest discomfort.  -We will go ahead and check a right and left heart catheterization to exclude coronary artery disease and to understand her pulmonary pressures.  Risks and benefits including stroke heart attack death renal impairment bleeding of been discussed.  She is willing to proceed. -Start Toprol-XL 25 mg a day -Start Losartan 25 mg p.o. once daily.  We will see her back in the clinic in 2 to 3 weeks.    Shared Decision Making/Informed Consent      Medication Adjustments/Labs and Tests Ordered: Current medicines are reviewed at length with the patient today.  Concerns regarding medicines are outlined above.  Orders Placed This Encounter  Procedures  . CBC  . Basic metabolic panel  . EKG 12-Lead   Meds ordered this encounter  Medications  . losartan (COZAAR) 25 MG tablet    Sig: Take 1 tablet (25 mg total) by mouth daily.    Dispense:  90  tablet    Refill:  3  . metoprolol succinate (TOPROL-XL) 25 MG 24 hr tablet    Sig: Take 1 tablet (25 mg total) by mouth daily.    Dispense:  90 tablet    Refill:  3  . aspirin EC 81 MG tablet    Sig: Take 1 tablet (81 mg total) by mouth daily. Swallow whole.    Dispense:  90 tablet    Refill:  3    Patient Instructions  Medication Instructions:  Please start Losartan 25 mg a day and  Metoprolol Succinate 25 mg a day. Take Aspirin 81 mg a day. Continue all other medications as listed.  *If you need a refill on your cardiac medications before your next appointment, please call your pharmacy*  Lab Work: Please have blood work [facility]) on Monday 11/8.  If you have labs (blood work) drawn today and your tests are completely normal, you will receive your results only by: Marland Kitchen MyChart Message (if you have MyChart) OR . A paper copy in the mail If you have any lab test that is abnormal or we need to change your treatment, we will call you to review the results.   Testing/Procedures: Your physician has requested that you have a cardiac catheterization. Cardiac catheterization is used to diagnose and/or treat various heart conditions. Doctors may recommend this procedure for a number of different reasons. The most common reason is to evaluate chest pain. Chest pain can be a symptom of coronary artery disease (CAD), and cardiac catheterization can show whether plaque is narrowing or blocking your heart's arteries. This procedure is also used to evaluate the valves, as well as measure the blood flow and oxygen levels in different parts of your heart. For further information please visit [email or web address].[email or web address]. Please follow instruction sheet, as given.  Follow-Up: At [facility], you and your health needs are our priority.  As part of our continuing mission to provide you with exceptional heart care, we have created designated Provider Care Teams.  These Care Teams include your primary  Cardiologist (physician) and Advanced Practice Providers (APPs -  Physician Assistants  and Nurse Practitioners) who all work together to provide you with the care you need, when you need it.  We recommend signing up for the patient portal called "MyChart".  Sign up information is provided on this After Visit Summary.  MyChart is used to connect with patients for Virtual Visits (Telemedicine).  Patients are able to view lab/test results, encounter notes, upcoming appointments, etc.  Non-urgent messages can be sent to your provider as well.   To learn more about what you can do with MyChart, go to [email or web address].    Your next appointment:   2 - 3 week(s)  The format for your next appointment:   In Person  Provider:   Candee Furbish, MD   Thank you for choosing [facility]!!         Signed, Candee Furbish, MD  12/10/2019 5:06 PM    [place]

## 2019-12-10 NOTE — Progress Notes (Signed)
Cardiology Office Note:    Date:  12/10/2019   ID:  STEPHEN TURNBAUGH, DOB [birth year], MRN [id number]  PCP:  Maurice Small, MD  [facility] Cardiologist:  Candee Furbish, MD  [facility] Electrophysiologist:  None   Referring MD: Maurice Small, MD     History of Present Illness:    Traci Roy is a 59 y.o. female here for the evaluation of dyspnea on exertion and chest discomfort post COVID.  An echocardiogram was performed that showed a mild to moderately reduced EF of 40 to 45%.  She was diagnosed with Covid and admitted to the hospital on 10/19/2019.  She was discharged on 10/29/2019.  She was given antibiotics for superimposed pneumonia.  Discharged on home oxygen now weaned off.  Since her post hospital follow-up visit, she has been having continued shortness of breath as well as chest discomfort with exertion, quite significant.   No tob, no DM (borderline). Father has CAD, stent.  +HTN, usually higher than detected today.   She also has past medical history of chronic fatigue anxiety depression.  Hospital data reviewed, discharge summary reviewed, chest x-ray reviewed.  Past Medical History:  Diagnosis Date   Arthritis    Depression    takes Lexapro daily   Dizziness    occasionally and no meds   Flu    end of Dec 2014   GERD (gastroesophageal reflux disease)    occasionally and no meds required   History of bronchitis    last time many yrs ago   History of duodenal ulcer    History of shingles 3-5 YRS AGO   Hypertension    takes Losartan and HCTZ daily   Joint pain    Restless legs     Past Surgical History:  Procedure Laterality Date   BREAST ENHANCEMENT SURGERY Bilateral    CESAREAN SECTION     X 1   COLONOSCOPY     enlarged bladder as a child     HAD SURGERY TO ENLARGE BLADDER   TONSILLECTOMY     TOTAL HIP ARTHROPLASTY Right 04/14/2013   Procedure: TOTAL HIP ARTHROPLASTY ANTERIOR APPROACH;  Surgeon: Hessie Dibble, MD;   Location: [place];  Service: Orthopedics;  Laterality: Right;    Current Medications: Current Meds  Medication Sig   escitalopram (LEXAPRO) 20 MG tablet Take 20 mg by mouth daily.   guaiFENesin-dextromethorphan (ROBITUSSIN DM) 100-10 MG/5ML syrup Take 10 mLs by mouth every 4 (four) hours as needed for cough.   hydrochlorothiazide (HYDRODIURIL) 25 MG tablet Take 25 mg by mouth daily.   ibuprofen (ADVIL,MOTRIN) 200 MG tablet Take 200-600 mg by mouth every 6 (six) hours as needed for headache or moderate pain.   magnesium oxide (MAG-OX) 400 MG tablet Take 400-800 mg by mouth at bedtime as needed (restless legs).     Allergies:   Lisinopril and Adhesive [tape]   Social History   Socioeconomic History   Marital status: Married    Spouse name: Not on file   Number of children: Not on file   Years of education: Not on file   Highest education level: Not on file  Occupational History   Not on file  Tobacco Use   Smoking status: Never Smoker   Smokeless tobacco: Never Used  Substance and Sexual Activity   Alcohol use: Yes    Comment: glass of wine every now and then   Drug use: No   Sexual activity: Yes    Birth control/protection: Other-see comments  Other Topics Concern   Not on file  Social History Narrative   Not on file   Social Determinants of Health   Financial Resource Strain:    Difficulty of Paying Living Expenses: Not on file  Food Insecurity:    Worried About [place] in the Last Year: Not on file   Ran Out of Food in the Last Year: Not on file  Transportation Needs:    Lack of Transportation (Medical): Not on file   Lack of Transportation (Non-Medical): Not on file  Physical Activity:    Days of Exercise per Week: Not on file   Minutes of Exercise per Session: Not on file  Stress:    Feeling of Stress : Not on file  Social Connections:    Frequency of Communication with Friends and Family: Not on file   Frequency of  Social Gatherings with Friends and Family: Not on file   Attends Religious Services: Not on file   Active Member of Clubs or Organizations: Not on file   Attends Archivist Meetings: Not on file   Marital Status: Not on file     Family History: See above  ROS:   Please see the history of present illness.    Tremor in hands more since COVID. All other systems reviewed and are negative.  EKGs/Labs/Other Studies Reviewed:    The following studies were reviewed today:  Echocardiogram 12/02/2019: 1. Left ventricular ejection fraction, by estimation, is 40 to 45%. The  left ventricle has mildly decreased function. The left ventricle  demonstrates global hypokinesis. The left ventricular internal cavity size  was mildly dilated. Left ventricular  diastolic parameters were normal.  2. Right ventricular systolic function is normal. The right ventricular  size is normal. There is normal pulmonary artery systolic pressure.  3. Left atrial size was mildly dilated.  4. The mitral valve is normal in structure. No evidence of mitral valve  regurgitation. No evidence of mitral stenosis.  5. The aortic valve is tricuspid. Aortic valve regurgitation is not  visualized. Mild aortic valve sclerosis is present, with no evidence of  aortic valve stenosis.  6. The inferior vena cava is normal in size with greater than 50%  respiratory variability, suggesting right atrial pressure of 3 mmHg.   EKG: EKG today on 12/10/2019 shows sinus rhythm with poor R wave progression-prior EKG from 10/22/2019 shows sinus rhythm 76 bpm with poor R wave progression  Recent Labs: 10/27/2019: ALT 43 10/28/2019: Magnesium 2.1 10/29/2019: BUN 22; Creatinine, Ser 1.03; Hemoglobin 12.4; Platelets 332; Potassium 4.6; Sodium 140  Recent Lipid Panel No results found for: CHOL, TRIG, HDL, CHOLHDL, VLDL, LDLCALC, LDLDIRECT    Physical Exam:    VS:  BP 126/80    Pulse 97    Ht 5\' 4"  (1.626 m)    Wt 242 lb  (109.8 kg)    LMP 02/05/2014    SpO2 95%    BMI 41.54 kg/m     Wt Readings from Last 3 Encounters:  12/10/19 242 lb (109.8 kg)  10/22/19 254 lb (115.2 kg)  08/02/15 249 lb 6.4 oz (113.1 kg)     GEN:  Well nourished, well developed in no acute distress HEENT: Normal NECK: No JVD; No carotid bruits LYMPHATICS: No lymphadenopathy CARDIAC: RRR, no murmurs, rubs, gallops RESPIRATORY:  Clear to auscultation without rales, wheezing or rhonchi  ABDOMEN: Soft, non-tender, non-distended MUSCULOSKELETAL:  No edema; No deformity  SKIN: Warm and dry NEUROLOGIC:  Alert and oriented  x 3 PSYCHIATRIC:  Normal affect   ASSESSMENT:    1. Pre-procedure lab exam   2. Shortness of breath   3. 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  Discharged on 10/29/2019. There for a few weeks.  --Has tightness when needs to breathe deep or with exertion. Parking to building for instance causes increased shortness of breath, sensation of chest discomfort.  -We will go ahead and check a right and left heart catheterization to exclude coronary artery disease and to understand her pulmonary pressures.  Risks and benefits including stroke heart attack death renal impairment bleeding of been discussed.  She is willing to proceed. -Start Toprol-XL 25 mg a day -Start Losartan 25 mg p.o. once daily.  We will see her back in the clinic in 2 to 3 weeks.    Shared Decision Making/Informed Consent      Medication Adjustments/Labs and Tests Ordered: Current medicines are reviewed at length with the patient today.  Concerns regarding medicines are outlined above.  Orders Placed This Encounter  Procedures   CBC   Basic metabolic panel   EKG 14-ERXV   Meds ordered this encounter  Medications   losartan (COZAAR) 25 MG tablet    Sig: Take 1 tablet (25 mg total) by mouth daily.    Dispense:  90  tablet    Refill:  3   metoprolol succinate (TOPROL-XL) 25 MG 24 hr tablet    Sig: Take 1 tablet (25 mg total) by mouth daily.    Dispense:  90 tablet    Refill:  3   aspirin EC 81 MG tablet    Sig: Take 1 tablet (81 mg total) by mouth daily. Swallow whole.    Dispense:  90 tablet    Refill:  3    Patient Instructions  Medication Instructions:  Please start Losartan 25 mg a day and  Metoprolol Succinate 25 mg a day. Take Aspirin 81 mg a day. Continue all other medications as listed.  *If you need a refill on your cardiac medications before your next appointment, please call your pharmacy*  Lab Work: Please have blood work [facility]) on Monday 11/8.  If you have labs (blood work) drawn today and your tests are completely normal, you will receive your results only by:  [place] (if you have MyChart) OR  A paper copy in the mail If you have any lab test that is abnormal or we need to change your treatment, we will call you to review the results.   Testing/Procedures: Your physician has requested that you have a cardiac catheterization. Cardiac catheterization is used to diagnose and/or treat various heart conditions. Doctors may recommend this procedure for a number of different reasons. The most common reason is to evaluate chest pain. Chest pain can be a symptom of coronary artery disease (CAD), and cardiac catheterization can show whether plaque is narrowing or blocking your hearts arteries. This procedure is also used to evaluate the valves, as well as measure the blood flow and oxygen levels in different parts of your heart. For further information please visit HugeFiesta.[email or web address]. Please follow instruction sheet, as given.  Follow-Up: At [facility], you and your health needs are our priority.  As part of our continuing mission to provide you with exceptional heart care, we have created designated Provider Care Teams.  These Care Teams include your primary  Cardiologist (physician) and  Advanced Practice Providers (APPs -  Physician Assistants and Nurse Practitioners) who all work together to provide you with the care you need, when you need it.  We recommend signing up for the patient portal called "MyChart".  Sign up information is provided on this After Visit Summary.  MyChart is used to connect with patients for Virtual Visits (Telemedicine).  Patients are able to view lab/test results, encounter notes, upcoming appointments, etc.  Non-urgent messages can be sent to your provider as well.   To learn more about what you can do with MyChart, go to [email or web address].    Your next appointment:   2 - 3 week(s)  The format for your next appointment:   In Person  Provider:   Candee Furbish, MD   Thank you for choosing [facility]!!         Signed, Candee Furbish, MD  12/10/2019 5:06 PM    [place]

## 2019-12-11 NOTE — Addendum Note (Signed)
Addended by: Candee Furbish C on: 12/11/2019 07:12 AM   Modules accepted: Orders, SmartSet

## 2019-12-14 ENCOUNTER — Other Ambulatory Visit: Payer: Self-pay

## 2019-12-14 ENCOUNTER — Other Ambulatory Visit: Payer: BC Managed Care – PPO | Admitting: *Deleted

## 2019-12-14 DIAGNOSIS — Z01812 Encounter for preprocedural laboratory examination: Secondary | ICD-10-CM

## 2019-12-14 DIAGNOSIS — R0602 Shortness of breath: Secondary | ICD-10-CM

## 2019-12-14 DIAGNOSIS — R0789 Other chest pain: Secondary | ICD-10-CM

## 2019-12-14 LAB — CBC
Hematocrit: 39.3 % (ref 34.0–46.6)
Hemoglobin: 13 g/dL (ref 11.1–15.9)
MCH: 31.1 pg (ref 26.6–33.0)
MCHC: 33.1 g/dL (ref 31.5–35.7)
MCV: 94 fL (ref 79–97)
Platelets: 238 10*3/uL (ref 150–450)
RBC: 4.18 x10E6/uL (ref 3.77–5.28)
RDW: 15.9 % — ABNORMAL HIGH (ref 11.7–15.4)
WBC: 8.8 10*3/uL (ref 3.4–10.8)

## 2019-12-14 LAB — BASIC METABOLIC PANEL
BUN/Creatinine Ratio: 20 (ref 9–23)
BUN: 16 mg/dL (ref 6–24)
CO2: 30 mmol/L — ABNORMAL HIGH (ref 20–29)
Calcium: 9.5 mg/dL (ref 8.7–10.2)
Chloride: 102 mmol/L (ref 96–106)
Creatinine, Ser: 0.79 mg/dL (ref 0.57–1.00)
GFR calc Af Amer: 95 mL/min/{1.73_m2} (ref >59)
GFR calc non Af Amer: 82 mL/min/{1.73_m2} (ref >59)
Glucose: 189 mg/dL — ABNORMAL HIGH (ref 65–99)
Potassium: 4 mmol/L (ref 3.5–5.2)
Sodium: 140 mmol/L (ref 134–144)

## 2019-12-17 ENCOUNTER — Telehealth: Payer: Self-pay | Admitting: *Deleted

## 2019-12-17 NOTE — Telephone Encounter (Signed)
Pt contacted pre-catheterization scheduled at [facility] for: Friday December 18, 2019 7:30 AM Verified arrival time and place: [facility]) at: 5:30 AM   No solid food after midnight prior to cath, clear liquids until 5 AM day of procedure.  Hold: HCTZ-AM of procedure   Except hold medications AM meds can be  taken pre-cath with sips of water including: ASA 81 mg   Confirmed patient has responsible adult to drive home post procedure and be with patient first 24 hours after arriving home: yes  You are allowed ONE visitor in the waiting room during the time you are at the hospital for your procedure. Both you and your visitor must wear a mask once you enter the hospital.       COVID-19 Pre-Screening Questions:  . In the past 14 days have you had a new cough, new headache, new nasal congestion, fever (100.4 or greater) unexplained body aches, new sore throat, or sudden loss of taste or sense of smell? no . In the past 14 days have you been around anyone with known Covid 19? No   Patient COVID-19 positive 10/21/19, patient has been asymptomatic.

## 2019-12-18 ENCOUNTER — Encounter (HOSPITAL_COMMUNITY)
Admission: RE | Disposition: A | Discharge status: Home / Self Care | Payer: Self-pay | Source: Home / Self Care | Attending: Interventional Cardiology

## 2019-12-18 ENCOUNTER — Ambulatory Visit (HOSPITAL_COMMUNITY)
Admission: RE | Admit: 2019-12-18 | Discharge: 2019-12-18 | Disposition: A | Discharge status: Home / Self Care | Payer: BC Managed Care – PPO | Attending: Interventional Cardiology | Admitting: Interventional Cardiology

## 2019-12-18 ENCOUNTER — Other Ambulatory Visit (HOSPITAL_COMMUNITY): Payer: Self-pay | Admitting: Physician Assistant

## 2019-12-18 DIAGNOSIS — R0789 Other chest pain: Secondary | ICD-10-CM

## 2019-12-18 DIAGNOSIS — E785 Hyperlipidemia, unspecified: Secondary | ICD-10-CM | POA: Insufficient documentation

## 2019-12-18 DIAGNOSIS — R0609 Other forms of dyspnea: Secondary | ICD-10-CM | POA: Diagnosis not present

## 2019-12-18 DIAGNOSIS — I110 Hypertensive heart disease with heart failure: Secondary | ICD-10-CM | POA: Insufficient documentation

## 2019-12-18 DIAGNOSIS — Z8616 Personal history of COVID-19: Secondary | ICD-10-CM | POA: Insufficient documentation

## 2019-12-18 DIAGNOSIS — I25119 Atherosclerotic heart disease of native coronary artery with unspecified angina pectoris: Secondary | ICD-10-CM

## 2019-12-18 DIAGNOSIS — I429 Cardiomyopathy, unspecified: Secondary | ICD-10-CM | POA: Insufficient documentation

## 2019-12-18 DIAGNOSIS — I209 Angina pectoris, unspecified: Secondary | ICD-10-CM

## 2019-12-18 DIAGNOSIS — Z888 Allergy status to other drugs, medicaments and biological substances status: Secondary | ICD-10-CM | POA: Diagnosis not present

## 2019-12-18 DIAGNOSIS — Z955 Presence of coronary angioplasty implant and graft: Secondary | ICD-10-CM

## 2019-12-18 DIAGNOSIS — I5022 Chronic systolic (congestive) heart failure: Secondary | ICD-10-CM | POA: Diagnosis not present

## 2019-12-18 DIAGNOSIS — Z01812 Encounter for preprocedural laboratory examination: Secondary | ICD-10-CM

## 2019-12-18 DIAGNOSIS — R0602 Shortness of breath: Secondary | ICD-10-CM

## 2019-12-18 HISTORY — PX: CORONARY PRESSURE/FFR STUDY: CATH118243

## 2019-12-18 HISTORY — PX: RIGHT/LEFT HEART CATH AND CORONARY ANGIOGRAPHY: CATH118266

## 2019-12-18 HISTORY — PX: CORONARY STENT INTERVENTION: CATH118234

## 2019-12-18 HISTORY — PX: INTRAVASCULAR PRESSURE WIRE/FFR STUDY: CATH118243

## 2019-12-18 LAB — POCT I-STAT EG7
Acid-Base Excess: 3 mmol/L — ABNORMAL HIGH (ref 0.0–2.0)
Bicarbonate: 28.9 mmol/L — ABNORMAL HIGH (ref 20.0–28.0)
Calcium, Ion: 1.22 mmol/L (ref 1.15–1.40)
HCT: 36 % (ref 36.0–46.0)
Hemoglobin: 12.2 g/dL (ref 12.0–15.0)
O2 Saturation: 72 %
Potassium: 3.7 mmol/L (ref 3.5–5.1)
Sodium: 141 mmol/L (ref 135–145)
TCO2: 30 mmol/L (ref 22–32)
pCO2, Ven: 48.5 mmHg (ref 44.0–60.0)
pH, Ven: 7.384 (ref 7.250–7.430)
pO2, Ven: 39 mmHg (ref 32.0–45.0)

## 2019-12-18 LAB — POCT I-STAT 7, (LYTES, BLD GAS, ICA,H+H)
Acid-Base Excess: 2 mmol/L (ref 0.0–2.0)
Bicarbonate: 27.4 mmol/L (ref 20.0–28.0)
Calcium, Ion: 1.2 mmol/L (ref 1.15–1.40)
HCT: 36 % (ref 36.0–46.0)
Hemoglobin: 12.2 g/dL (ref 12.0–15.0)
O2 Saturation: 97 %
Potassium: 3.7 mmol/L (ref 3.5–5.1)
Sodium: 142 mmol/L (ref 135–145)
TCO2: 29 mmol/L (ref 22–32)
pCO2 arterial: 42.6 mmHg (ref 32.0–48.0)
pH, Arterial: 7.416 (ref 7.350–7.450)
pO2, Arterial: 85 mmHg (ref 83.0–108.0)

## 2019-12-18 LAB — POCT ACTIVATED CLOTTING TIME: Activated Clotting Time: 301 seconds

## 2019-12-18 SURGERY — RIGHT/LEFT HEART CATH AND CORONARY ANGIOGRAPHY
Anesthesia: LOCAL

## 2019-12-18 MED ORDER — HEPARIN (PORCINE) IN NACL 1000-0.9 UT/500ML-% IV SOLN
INTRAVENOUS | Status: DC | PRN
  Administered 2019-12-18 (×2): 500 mL

## 2019-12-18 MED ORDER — FENTANYL CITRATE (PF) 100 MCG/2ML IJ SOLN
INTRAMUSCULAR | Status: DC | PRN
  Administered 2019-12-18 (×2): 25 ug via INTRAVENOUS

## 2019-12-18 MED ORDER — HYDRALAZINE HCL 20 MG/ML IJ SOLN: 10.0000 mg | INTRAMUSCULAR | Status: DC | PRN

## 2019-12-18 MED ORDER — SODIUM CHLORIDE 0.9 % IV SOLN: INTRAVENOUS | Status: AC

## 2019-12-18 MED ORDER — ATORVASTATIN CALCIUM 80 MG PO TABS: 80.0000 mg | ORAL_TABLET | Freq: Every day | ORAL | 0 refills | Status: DC

## 2019-12-18 MED ORDER — LABETALOL HCL 5 MG/ML IV SOLN: 10.0000 mg | INTRAVENOUS | Status: DC | PRN

## 2019-12-18 MED ORDER — HEPARIN (PORCINE) IN NACL 1000-0.9 UT/500ML-% IV SOLN
INTRAVENOUS | Status: AC
  Filled 2019-12-18: qty 1000

## 2019-12-18 MED ORDER — VERAPAMIL HCL 2.5 MG/ML IV SOLN
INTRAVENOUS | Status: DC | PRN
  Administered 2019-12-18: 10 mL via INTRA_ARTERIAL

## 2019-12-18 MED ORDER — MIDAZOLAM HCL 2 MG/2ML IJ SOLN
INTRAMUSCULAR | Status: DC | PRN
  Administered 2019-12-18: 1 mg via INTRAVENOUS
  Administered 2019-12-18: 2 mg via INTRAVENOUS

## 2019-12-18 MED ORDER — HEPARIN SODIUM (PORCINE) 1000 UNIT/ML IJ SOLN
INTRAMUSCULAR | Status: DC | PRN
  Administered 2019-12-18 (×2): 5000 [IU] via INTRAVENOUS
  Administered 2019-12-18 (×2): 2000 [IU] via INTRAVENOUS

## 2019-12-18 MED ORDER — LIDOCAINE HCL (PF) 1 % IJ SOLN
INTRAMUSCULAR | Status: DC | PRN
  Administered 2019-12-18: 4 mL

## 2019-12-18 MED ORDER — CLOPIDOGREL BISULFATE 300 MG PO TABS
ORAL_TABLET | ORAL | Status: AC
  Filled 2019-12-18: qty 1

## 2019-12-18 MED ORDER — FENTANYL CITRATE (PF) 100 MCG/2ML IJ SOLN
INTRAMUSCULAR | Status: AC
  Filled 2019-12-18: qty 2

## 2019-12-18 MED ORDER — CLOPIDOGREL BISULFATE 300 MG PO TABS
ORAL_TABLET | ORAL | Status: DC | PRN
  Administered 2019-12-18: 600 mg via ORAL

## 2019-12-18 MED ORDER — NITROGLYCERIN 0.4 MG SL SUBL: 0.4000 mg | SUBLINGUAL_TABLET | SUBLINGUAL | 2 refills | Status: DC | PRN

## 2019-12-18 MED ORDER — HEPARIN SODIUM (PORCINE) 1000 UNIT/ML IJ SOLN
INTRAMUSCULAR | Status: AC
  Filled 2019-12-18: qty 1

## 2019-12-18 MED ORDER — SODIUM CHLORIDE 0.9 % IV SOLN: 250.0000 mL | INTRAVENOUS | Status: DC | PRN

## 2019-12-18 MED ORDER — SODIUM CHLORIDE 0.9 % WEIGHT BASED INFUSION
3.0000 mL/kg/h | INTRAVENOUS | Status: AC
  Administered 2019-12-18: 3 mL/kg/h via INTRAVENOUS

## 2019-12-18 MED ORDER — VERAPAMIL HCL 2.5 MG/ML IV SOLN
INTRAVENOUS | Status: AC
  Filled 2019-12-18: qty 2

## 2019-12-18 MED ORDER — SODIUM CHLORIDE 0.9% FLUSH: 3.0000 mL | Freq: Two times a day (BID) | INTRAVENOUS | Status: DC

## 2019-12-18 MED ORDER — ASPIRIN 81 MG PO CHEW: 81.0000 mg | CHEWABLE_TABLET | ORAL | Status: DC

## 2019-12-18 MED ORDER — ACETAMINOPHEN 325 MG PO TABS: 650.0000 mg | ORAL_TABLET | ORAL | Status: DC | PRN

## 2019-12-18 MED ORDER — ASPIRIN 81 MG PO CHEW: 81.0000 mg | CHEWABLE_TABLET | Freq: Every day | ORAL | Status: DC

## 2019-12-18 MED ORDER — NITROGLYCERIN 0.4 MG SL SUBL: 0.4000 mg | SUBLINGUAL_TABLET | SUBLINGUAL | 0 refills | Status: DC | PRN

## 2019-12-18 MED ORDER — ASPIRIN EC 81 MG PO TBEC: 81.0000 mg | DELAYED_RELEASE_TABLET | Freq: Every day | ORAL | 3 refills | Status: DC

## 2019-12-18 MED ORDER — MIDAZOLAM HCL 2 MG/2ML IJ SOLN
INTRAMUSCULAR | Status: AC
  Filled 2019-12-18: qty 2

## 2019-12-18 MED ORDER — SODIUM CHLORIDE 0.9% FLUSH: 3.0000 mL | INTRAVENOUS | Status: DC | PRN

## 2019-12-18 MED ORDER — ONDANSETRON HCL 4 MG/2ML IJ SOLN: 4.0000 mg | Freq: Four times a day (QID) | INTRAMUSCULAR | Status: DC | PRN

## 2019-12-18 MED ORDER — SODIUM CHLORIDE 0.9 % WEIGHT BASED INFUSION: 1.0000 mL/kg/h | INTRAVENOUS | Status: DC

## 2019-12-18 MED ORDER — CLOPIDOGREL BISULFATE 75 MG PO TABS: 75.0000 mg | ORAL_TABLET | Freq: Every day | ORAL | Status: DC

## 2019-12-18 MED ORDER — LIDOCAINE HCL (PF) 1 % IJ SOLN
INTRAMUSCULAR | Status: AC
  Filled 2019-12-18: qty 30

## 2019-12-18 MED ORDER — NITROGLYCERIN 1 MG/10 ML FOR IR/CATH LAB
INTRA_ARTERIAL | Status: DC | PRN
  Administered 2019-12-18: 200 ug via INTRACORONARY
  Administered 2019-12-18: 100 ug via INTRA_ARTERIAL

## 2019-12-18 MED ORDER — NITROGLYCERIN 1 MG/10 ML FOR IR/CATH LAB
INTRA_ARTERIAL | Status: AC
  Filled 2019-12-18: qty 10

## 2019-12-18 MED ORDER — CLOPIDOGREL BISULFATE 75 MG PO TABS: 75.0000 mg | ORAL_TABLET | Freq: Every day | ORAL | 3 refills | Status: DC

## 2019-12-18 MED ORDER — CLOPIDOGREL BISULFATE 75 MG PO TABS: 75.0000 mg | ORAL_TABLET | Freq: Every day | ORAL | 0 refills | Status: DC

## 2019-12-18 MED ORDER — IOHEXOL 350 MG/ML SOLN
INTRAVENOUS | Status: DC | PRN
  Administered 2019-12-18: 130 mL

## 2019-12-18 MED FILL — ATORVASTATIN CALCIUM 80 MG: 80 | 30 days supply | Qty: 30 | Fill #0

## 2019-12-18 MED FILL — NITROGLYCERIN 0.4 MG TAB SL: 0.4 | 8 days supply | Qty: 25 | Fill #0

## 2019-12-18 MED FILL — CLOPIDOGREL 75 MG TABLET: 75 | 30 days supply | Qty: 30 | Fill #0

## 2019-12-18 SURGICAL SUPPLY — 18 items
BALLN SAPPHIRE ~~LOC~~ 3.75X15 (BALLOONS) ×1 IMPLANT
CATH 5FR JL3.5 JR4 ANG PIG MP (CATHETERS) ×1 IMPLANT
CATH BALLN WEDGE 5F 110CM (CATHETERS) ×1 IMPLANT
CATH LAUNCHER 6FR EBU 3 (CATHETERS) ×1 IMPLANT
DEVICE RAD COMP TR BAND LRG (VASCULAR PRODUCTS) ×1 IMPLANT
GLIDESHEATH SLEND SS 6F .021 (SHEATH) ×1 IMPLANT
GUIDEWIRE INQWIRE 1.5J.035X260 (WIRE) IMPLANT
GUIDEWIRE PRESSURE COMET II (WIRE) ×1 IMPLANT
INQWIRE 1.5J .035X260CM (WIRE) ×4
KIT ENCORE 26 ADVANTAGE (KITS) ×1 IMPLANT
KIT ESSENTIALS PG (KITS) ×1 IMPLANT
KIT HEART LEFT (KITS) ×2 IMPLANT
KIT HEMO VALVE WATCHDOG (MISCELLANEOUS) ×1 IMPLANT
PACK CARDIAC CATHETERIZATION (CUSTOM PROCEDURE TRAY) ×2 IMPLANT
SHEATH GLIDE SLENDER 4/5FR (SHEATH) ×1 IMPLANT
STENT RESOLUTE ONYX 3.0X26 (Permanent Stent) ×1 IMPLANT
TRANSDUCER W/STOPCOCK (MISCELLANEOUS) ×2 IMPLANT
TUBING CIL FLEX 10 FLL-RA (TUBING) ×2 IMPLANT

## 2019-12-18 NOTE — Discharge Instructions (Addendum)
Information about your medication: Plavix (anti-platelet agent)  Generic Name (Brand): clopidogrel (Plavix), once daily medication  PURPOSE: You are taking this medication along with aspirin to lower your chance of having a heart attack, stroke, or blood clots in your heart stent. These can be fatal. Plavix and aspirin help prevent platelets from sticking together and forming a clot that can block an artery or your stent.   Common SIDE EFFECTS you may experience include: bruising or bleeding more easily, shortness of breath  Do not stop taking PLAVIX without talking to the doctor who prescribes it for you. People who are treated with a stent and stop taking Plavix too soon, have a higher risk of getting a blood clot in the stent, having a heart attack, or dying. If you stop Plavix because of bleeding, or for other reasons, your risk of a heart attack or stroke may increase.   Avoid taking NSAID agents or anti-inflammatory medications such as ibuprofen, naproxen given increased bleed risk with plavix - can use acetaminophen (Tylenol) if needed for pain.  Avoid taking over the counter stomach medications omeprazole (Prilosec) or esomeprazole (Nexium) since these do interact and make plavix less effective - ask your pharmacist or doctor for alterative agents if needed for heartburn or GERD.   Tell all of your doctors and dentists that you are taking Plavix. They should talk to the doctor who prescribed Plavix for you before you have any surgery or invasive procedure.   Contact your health care provider if you experience: severe or uncontrollable bleeding, pink/red/brown urine, vomiting blood or vomit that looks like "coffee grounds", red or black stools (looks like tar), coughing up blood or blood clots ----------------------------------------------------------------------------------------------------------------------        Radial Site Care  This sheet gives you information about how to  care for yourself after your procedure. Your health care provider may also give you more specific instructions. If you have problems or questions, contact your health care provider. What can I expect after the procedure? After the procedure, it is common to have:  Bruising and tenderness at the catheter insertion area. Follow these instructions at home: Medicines  Take over-the-counter and prescription medicines only as told by your health care provider. Insertion site care  Follow instructions from your health care provider about how to take care of your insertion site. Make sure you: ? Wash your hands with soap and water before you change your bandage (dressing). If soap and water are not available, use hand sanitizer. ? Change your dressing as told by your health care provider.  Check your insertion site every day for signs of infection. Check for: ? Redness, swelling, or pain. ? Fluid or blood. ? Pus or a bad smell. ? Warmth.  Do not take baths, swim, or use a hot tub until your health care provider approves.  You may shower 24-48 hours after the procedure, or as directed by your health care provider. ? Remove the dressing and gently wash the site with plain soap and water. ? Pat the area dry with a clean towel. ? Do not rub the site. That could cause bleeding.  Do not apply powder or lotion to the site. Activity   For 24 hours after the procedure, or as directed by your health care provider: ? Do not flex or bend the affected arm. ? Do not push or pull heavy objects with the affected arm. ? Do not drive yourself home from the hospital or clinic. You may drive 24  hours after the procedure unless your health care provider tells you not to. ? Do not operate machinery or power tools.  Do not lift anything that is heavier than 10 lb (4.5 kg), or the limit that you are told, until your health care provider says that it is safe.  Ask your health care provider when it is okay  to: ? Return to work or school. ? Resume usual physical activities or sports. ? Resume sexual activity. General instructions  If the catheter site starts to bleed, raise your arm and put firm pressure on the site. If the bleeding does not stop, get help right away. This is a medical emergency.  If you went home on the same day as your procedure, a responsible adult should be with you for the first 24 hours after you arrive home.  Keep all follow-up visits as told by your health care provider. This is important. Contact a health care provider if:  You have a fever.  You have redness, swelling, or yellow drainage around your insertion site. Get help right away if:  You have unusual pain at the radial site.  The catheter insertion area swells very fast.  The insertion area is bleeding, and the bleeding does not stop when you hold steady pressure on the area.  Your arm or hand becomes pale, cool, tingly, or numb. These symptoms may represent a serious problem that is an emergency. Do not wait to see if the symptoms will go away. Get medical help right away. Call your local emergency services (911 in the [place] Do not drive yourself to the hospital. Summary  After the procedure, it is common to have bruising and tenderness at the site.  Follow instructions from your health care provider about how to take care of your radial site wound. Check the wound every day for signs of infection.  Do not lift anything that is heavier than 10 lb (4.5 kg), or the limit that you are told, until your health care provider says that it is safe. This information is not intended to replace advice given to you by your health care provider. Make sure you discuss any questions you have with your health care provider. Document Revised: 02/27/2017 Document Reviewed: 02/27/2017 Elsevier Patient Education  [place].     Moderate Conscious Sedation, Adult, Care After These instructions provide you  with information about caring for yourself after your procedure. Your health care provider may also give you more specific instructions. Your treatment has been planned according to current medical practices, but problems sometimes occur. Call your health care provider if you have any problems or questions after your procedure. What can I expect after the procedure? After your procedure, it is common:  To feel sleepy for several hours.  To feel clumsy and have poor balance for several hours.  To have poor judgment for several hours.  To vomit if you eat too soon. Follow these instructions at home: For at least 24 hours after the procedure:   Do not: ? Participate in activities where you could fall or become injured. ? Drive. ? Use heavy machinery. ? Drink alcohol. ? Take sleeping pills or medicines that cause drowsiness. ? Make important decisions or sign legal documents. ? Take care of children on your own.  Rest. Eating and drinking  Follow the diet recommended by your health care provider.  If you vomit: ? Drink water, juice, or soup when you can drink without vomiting. ? Make sure you  have little or no nausea before eating solid foods. General instructions  Have a responsible adult stay with you until you are awake and alert.  Take over-the-counter and prescription medicines only as told by your health care provider.  If you smoke, do not smoke without supervision.  Keep all follow-up visits as told by your health care provider. This is important. Contact a health care provider if:  You keep feeling nauseous or you keep vomiting.  You feel light-headed.  You develop a rash.  You have a fever. Get help right away if:  You have trouble breathing. This information is not intended to replace advice given to you by your health care provider. Make sure you discuss any questions you have with your health care provider. Document Revised: 01/04/2017 Document Reviewed:  05/14/2015 Elsevier Patient Education  2020 [employer].

## 2019-12-18 NOTE — Progress Notes (Signed)
Discussed stent, Plavix, restrictions, diet, exercise, and CRPII with pt. Very receptive, understands importance of Plavix. She has been wanting to lose weight. Will refer to [place].  [id number] Yves Dill CES, ACSM 10:45 AM 12/18/2019

## 2019-12-18 NOTE — Interval H&P Note (Signed)
Cath Lab Visit (complete for each Cath Lab visit)  Clinical Evaluation Leading to the Procedure:   ACS: No.  Non-ACS:    Anginal Classification: CCS III  Anti-ischemic medical therapy: Minimal Therapy (1 class of medications)  Non-Invasive Test Results: No non-invasive testing performed  Prior CABG: No previous CABG      History and Physical Interval Note:  12/18/2019 7:36 AM  Traci Roy  has presented today for surgery, with the diagnosis of shortness of breath.  The various methods of treatment have been discussed with the patient and family. After consideration of risks, benefits and other options for treatment, the patient has consented to  Procedure(s): RIGHT/LEFT HEART CATH AND CORONARY ANGIOGRAPHY (N/A) as a surgical intervention.  The patient's history has been reviewed, patient examined, no change in status, stable for surgery.  I have reviewed the patient's chart and labs.  Questions were answered to the patient's satisfaction.     Larae Grooms

## 2019-12-18 NOTE — Progress Notes (Signed)
Cardiac Rehab currently at the bedside.

## 2019-12-18 NOTE — Discharge Summary (Addendum)
Discharge Summary for Same Day PCI   Patient ID: Traci Roy MRN: [id number]; DOB: 1960/12/10  Admit date: 12/18/2019 Discharge date: 12/19/2019  Primary Care Provider: Maurice Small, MD  Primary Cardiologist: Candee Furbish, MD  Primary Electrophysiologist:  None   Discharge Diagnoses    Active Problems:   Angina pectoris [facility])   CAD   HLD   HTN   Chronic systolic CHF  Diagnostic Studies/Procedures    Cardiac Catheterization 12/19/2019:  CORONARY STENT INTERVENTION  INTRAVASCULAR PRESSURE WIRE/FFR STUDY  RIGHT/LEFT HEART CATH AND CORONARY ANGIOGRAPHY  Conclusion     2nd Diag lesion is 50% stenosed.  Mid LAD lesion is 70% stenosed. Significant by DFR.  A drug-eluting stent was successfully placed using a STENT RESOLUTE ONYX 3.0X26, postdilated to 3.75.  Post intervention, there is a 0% residual stenosis.  The left ventricular ejection fraction is 50-55% by visual estimate.  LV end diastolic pressure is normal.  The left ventricular systolic function is normal.  There is no aortic valve stenosis.  Ao sat 97%, PA sat 72%, PA pressure 35/14, mean PA pressure 24 mm Hg, mean PCWP 11 mm Hg; CO 6.3 L/min; CI 3.01  Normal right heart pressures.  EBU 3.5 would have been a better choice of guide catheter.   Continue dual antiplatelet therapy along with aggressive secondary prevention.     Plan for same day discharge.  Diagnostic Dominance: Right  Intervention   History of Present Illness     Traci Roy is a 59 y.o. female  With hx of HTN, recent COVID and cardiomyopathy presented for outpatient cath.   She was diagnosed with Covid and admitted to the hospital on 10/19/2019.  She was discharged on home oxygen 10/29/2019 and weaned off. She had dyspnea on exertion and chest discomfort post COVID. An echocardiogram was order by PCP that showed a mild to moderately reduced EF of 40 to 45%. Established care with Dr. Skains11/05/2019. Started Toprol XL and  Losartan.   Cardiac catheterization was arranged for further evaluation.  Hospital Course     The patient underwent cardiac cath as noted above with 70% mLAD which was significant by DFR. S/p PTCA and DES. Medical management for 50% stenosed 2nd dig. Norma right heart pressures. Plan for DAPT with ASA and Plavix. The patient was seen by cardiac rehab while in short stay. There were no observed complications post cath. Radial cath site was re-evaluated prior to discharge and found to be stable without any complications. Instructions/precautions regarding cath site care were given prior to discharge.  Traci Roy was seen by Dr. Irish Lack and determined stable for discharge home. Follow up with our office has been arranged. Medications are listed below. Pertinent changes include addition of Plavix and statin.  _____________  Cath/PCI Registry Performance & Quality Measures: 1. Aspirin prescribed? - Yes 2. ADP Receptor Inhibitor (Plavix/Clopidogrel, Brilinta/Ticagrelor or Effient/Prasugrel) prescribed (includes medically managed patients)? - Yes 3. High Intensity Statin (Lipitor 40-80mg  or Crestor 20-40mg ) prescribed? - Yes 4. For EF <40%, was ACEI/ARB prescribed? - Yes 5. For EF <40%, Aldosterone Antagonist (Spironolactone or Eplerenone) prescribed? - Not Applicable (EF >/= 34%) 6. Cardiac Rehab Phase II ordered (Included Medically managed Patients)? - Yes     Discharge Vitals Blood pressure 132/63, pulse 65, temperature (!) 97.5 F (36.4 C), temperature source Oral, resp. rate 16, height 5\' 4"  (1.626 m), weight 238 lb (108 kg), last menstrual period 02/05/2014, SpO2 96 %.  Filed Weights   12/18/19 0536  Weight: 238  lb (108 kg)    Last Labs & Radiologic Studies     CARDIAC CATHETERIZATION  Addendum Date: 12/18/2019    2nd Diag lesion is 50% stenosed.  Mid LAD lesion is 70% stenosed. Significant by DFR.  A drug-eluting stent was successfully placed using a STENT RESOLUTE ONYX  3.0X26, postdilated to 3.75.  Post intervention, there is a 0% residual stenosis.  The left ventricular ejection fraction is 50-55% by visual estimate.  LV end diastolic pressure is normal.  The left ventricular systolic function is normal.  There is no aortic valve stenosis.  Ao sat 97%, PA sat 72%, PA pressure 35/14, mean PA pressure 24 mm Hg, mean PCWP 11 mm Hg; CO 6.3 L/min; CI 3.01  Normal right heart pressures.  EBU 3.5 would have been a better choice of guide catheter.  Continue dual antiplatelet therapy along with aggressive secondary prevention.  Plan for same day discharge.   Result Date: 12/18/2019  2nd Diag lesion is 50% stenosed.  Mid LAD lesion is 70% stenosed. Significant by DFR.  A drug-eluting stent was successfully placed using a STENT RESOLUTE ONYX 3.0X26, postdilated to 3.75.  Post intervention, there is a 0% residual stenosis.  The left ventricular ejection fraction is 50-55% by visual estimate.  LV end diastolic pressure is normal.  The left ventricular systolic function is normal.  There is no aortic valve stenosis.  Ao sat 97%, PA sat 72%, PA pressure 35/14, mean PA pressure 24 mm Hg, mean PCWP 11 mm Hg; CO 6.3 L/min; CI 3.01  Normal right heart pressures.  Continue dual antiplatelet therapy along with aggressive secondary prevention.  Plan for same day discharge.   ECHOCARDIOGRAM COMPLETE  Result Date: 12/02/2019    ECHOCARDIOGRAM REPORT   Patient Name:   Traci Roy Ironside Date of Exam: 12/02/2019 Medical Rec #:  [id number]       Height:       64.0 in Accession #:    [id number]      Weight:       254.0 lb Date of Birth:  07/01/1960       BSA:          2.166 m Patient Age:    34 years        BP:           142/94 mmHg Patient Gender: F               HR:           86 bpm. Exam Location:  [address] Procedure: 2D Echo, Cardiac Doppler, Color Doppler and 3D Echo Indications:    R06.00 SOB  History:        Patient has no prior history of Echocardiogram examinations.                  AKI; Risk Factors:Hypertension. COVID 19 ( September), Breast                 implants.  Sonographer:    Marygrace Drought RCS Referring Phys: [place]  1. Left ventricular ejection fraction, by estimation, is 40 to 45%. The left ventricle has mildly decreased function. The left ventricle demonstrates global hypokinesis. The left ventricular internal cavity size was mildly dilated. Left ventricular diastolic parameters were normal.  2. Right ventricular systolic function is normal. The right ventricular size is normal. There is normal pulmonary artery systolic pressure.  3. Left atrial size was mildly dilated.  4. The mitral valve is  normal in structure. No evidence of mitral valve regurgitation. No evidence of mitral stenosis.  5. The aortic valve is tricuspid. Aortic valve regurgitation is not visualized. Mild aortic valve sclerosis is present, with no evidence of aortic valve stenosis.  6. The inferior vena cava is normal in size with greater than 50% respiratory variability, suggesting right atrial pressure of 3 mmHg. FINDINGS  Left Ventricle: Left ventricular ejection fraction, by estimation, is 40 to 45%. The left ventricle has mildly decreased function. The left ventricle demonstrates global hypokinesis. The left ventricular internal cavity size was mildly dilated. There is  no left ventricular hypertrophy. Left ventricular diastolic parameters were normal. Right Ventricle: The right ventricular size is normal. No increase in right ventricular wall thickness. Right ventricular systolic function is normal. There is normal pulmonary artery systolic pressure. The tricuspid regurgitant velocity is 1.90 m/s, and  with an assumed right atrial pressure of 3 mmHg, the estimated right ventricular systolic pressure is 52.7 mmHg. Left Atrium: Left atrial size was mildly dilated. Right Atrium: Right atrial size was normal in size. Pericardium: There is no evidence of pericardial effusion. Mitral  Valve: The mitral valve is normal in structure. No evidence of mitral valve regurgitation. No evidence of mitral valve stenosis. Tricuspid Valve: The tricuspid valve is normal in structure. Tricuspid valve regurgitation is trivial. No evidence of tricuspid stenosis. Aortic Valve: The aortic valve is tricuspid. Aortic valve regurgitation is not visualized. Mild aortic valve sclerosis is present, with no evidence of aortic valve stenosis. Pulmonic Valve: The pulmonic valve was normal in structure. Pulmonic valve regurgitation is not visualized. No evidence of pulmonic stenosis. Aorta: The aortic root is normal in size and structure. Venous: The inferior vena cava is normal in size with greater than 50% respiratory variability, suggesting right atrial pressure of 3 mmHg. IAS/Shunts: No atrial level shunt detected by color flow Doppler.  LEFT VENTRICLE PLAX 2D LVIDd:         5.30 cm  Diastology LVIDs:         3.70 cm  LV e' medial:    6.20 cm/s LV PW:         1.40 cm  LV E/e' medial:  8.3 LV IVS:        0.80 cm  LV e' lateral:   5.77 cm/s LVOT diam:     2.00 cm  LV E/e' lateral: 8.9 LV SV:         47 LV SV Index:   22 LVOT Area:     3.14 cm                          3D Volume EF:                         3D EF:        53 %                         LV EDV:       106 ml                         LV ESV:       50 ml                         LV SV:        56 ml  RIGHT VENTRICLE RV Basal diam:  2.40 cm RV S prime:     13.90 cm/s TAPSE (M-mode): 2.0 cm RVSP:           17.4 mmHg LEFT ATRIUM             Index       RIGHT ATRIUM           Index LA diam:        3.80 cm 1.75 cm/m  RA Pressure: 3.00 mmHg LA Vol (A2C):   71.6 ml 33.06 ml/m RA Area:     13.40 cm LA Vol (A4C):   39.2 ml 18.10 ml/m RA Volume:   32.00 ml  14.78 ml/m LA Biplane Vol: 56.0 ml 25.86 ml/m  AORTIC VALVE LVOT Vmax:   69.80 cm/s LVOT Vmean:  45.500 cm/s LVOT VTI:    0.149 m  AORTA Ao Root diam: 2.80 cm MITRAL VALVE               TRICUSPID VALVE MV Area (PHT):              TR Peak grad:   14.4 mmHg MV Decel Time:             TR Vmax:        190.00 cm/s MV E velocity: 51.40 cm/s  Estimated RAP:  3.00 mmHg MV A velocity: 91.30 cm/s  RVSP:           17.4 mmHg MV E/A ratio:  0.56                            SHUNTS                            Systemic VTI:  0.15 m                            Systemic Diam: 2.00 cm Jenkins Rouge MD Electronically signed by Jenkins Rouge MD Signature Date/Time: 12/02/2019/1:43:51 PM    Final      Disposition   Pt is being discharged home today in good condition.  Follow-up Plans & Appointments      Discharge Instructions     Amb Referral to Cardiac Rehabilitation   Complete by: As directed    Diagnosis:  Coronary Stents PTCA     After initial evaluation and assessments completed: Virtual Based Care may be provided alone or in conjunction with Phase 2 Cardiac Rehab based on patient barriers.: Yes        Discharge Medications   Allergies as of 12/18/2019       Reactions   Lisinopril Cough   Adhesive [tape] Itching, Rash   Itching and breaks out        Medication List     STOP taking these medications    guaiFENesin-dextromethorphan 100-10 MG/5ML syrup Commonly known as: ROBITUSSIN DM   ibuprofen 200 MG tablet Commonly known as: ADVIL       TAKE these medications    aspirin EC 81 MG tablet Take 1 tablet (81 mg total) by mouth daily. Swallow whole.   atorvastatin 80 MG tablet Commonly known as: Lipitor Take 1 tablet (80 mg total) by mouth daily.   clopidogrel 75 MG tablet Commonly known as: Plavix Take 1 tablet (75 mg total) by mouth daily.   escitalopram 20 MG tablet Commonly known as:  LEXAPRO Take 20 mg by mouth daily.   hydrochlorothiazide 25 MG tablet Commonly known as: HYDRODIURIL Take 25 mg by mouth daily.   losartan 25 MG tablet Commonly known as: COZAAR Take 1 tablet (25 mg total) by mouth daily. What changed: when to take this   Magnesium Oxide 250 MG Tabs Take 250 mg by  mouth at bedtime.   melatonin 3 MG Tabs tablet Take 1.5 mg by mouth at bedtime.   metoprolol succinate 25 MG 24 hr tablet Commonly known as: TOPROL-XL Take 1 tablet (25 mg total) by mouth daily.   nitroGLYCERIN 0.4 MG SL tablet Commonly known as: Nitrostat Place 1 tablet (0.4 mg total) under the tongue every 5 (five) minutes as needed for chest pain.           Allergies Allergies  Allergen Reactions  . Lisinopril Cough  . Adhesive [Tape] Itching and Rash    Itching and breaks out    Outstanding Labs/Studies   Consider OP f/u labs 6-8 weeks given statin initiation this admission.  Duration of Discharge Encounter   Greater than 30 minutes including physician time.  SignedCrista Luria [place], PA 12/19/2019, 9:12 AM   I have examined the patient and reviewed assessment and plan and discussed with patient.  Agree with above as stated.    Right wrist stable post PCI.  Again discussed procedure with her along with DAPT recommendations.  We discussed recent COVID diagnosis.  Cardiac rehab recommended. Dietary changes, regular exercise and general healthy lifestyle changes discussed. Plan for same day discharge.

## 2019-12-18 NOTE — Progress Notes (Signed)
Dr. Irish Lack at the bedside.

## 2019-12-21 ENCOUNTER — Encounter (HOSPITAL_COMMUNITY): Payer: Self-pay | Admitting: Interventional Cardiology

## 2019-12-22 ENCOUNTER — Ambulatory Visit
Admission: RE | Admit: 2019-12-22 | Discharge: 2019-12-22 | Disposition: A | Discharge status: Home / Self Care | Payer: BC Managed Care – PPO | Source: Ambulatory Visit | Attending: Family Medicine | Admitting: Family Medicine

## 2019-12-22 DIAGNOSIS — J984 Other disorders of lung: Secondary | ICD-10-CM | POA: Diagnosis not present

## 2019-12-22 DIAGNOSIS — R0600 Dyspnea, unspecified: Secondary | ICD-10-CM

## 2019-12-22 DIAGNOSIS — J90 Pleural effusion, not elsewhere classified: Secondary | ICD-10-CM | POA: Diagnosis not present

## 2019-12-22 DIAGNOSIS — R0609 Other forms of dyspnea: Secondary | ICD-10-CM

## 2019-12-22 DIAGNOSIS — K7689 Other specified diseases of liver: Secondary | ICD-10-CM | POA: Diagnosis not present

## 2019-12-22 DIAGNOSIS — J9811 Atelectasis: Secondary | ICD-10-CM | POA: Diagnosis not present

## 2019-12-22 MED ORDER — IOPAMIDOL (ISOVUE-300) INJECTION 61%
75.0000 mL | Freq: Once | INTRAVENOUS | Status: AC | PRN
  Administered 2019-12-22: 75 mL via INTRAVENOUS

## 2019-12-23 ENCOUNTER — Telehealth (HOSPITAL_COMMUNITY): Payer: Self-pay

## 2019-12-23 ENCOUNTER — Encounter: Payer: Self-pay | Admitting: Pulmonary Disease

## 2019-12-23 ENCOUNTER — Other Ambulatory Visit: Payer: Self-pay

## 2019-12-23 ENCOUNTER — Ambulatory Visit: Payer: BC Managed Care – PPO | Admitting: Pulmonary Disease

## 2019-12-23 VITALS — BP 130/66 | HR 78 | Temp 97.8°F | Wt 238.0 lb

## 2019-12-23 DIAGNOSIS — R9389 Abnormal findings on diagnostic imaging of other specified body structures: Secondary | ICD-10-CM | POA: Diagnosis not present

## 2019-12-23 DIAGNOSIS — R0789 Other chest pain: Secondary | ICD-10-CM

## 2019-12-23 NOTE — Patient Instructions (Signed)
Chest discomfort CT scan of the chest  -I do not see anything of concern on your recent CT -Likely related to the fact that you had Covid infection recently -This should improve over time -The discomfort should improve over time as well  Regarding possible sleep apnea -Informational material for sleep disordered breathing down below  -Continue weight loss efforts  Call with significant concerns  Tentative follow-up in 3 months, if you are feeling great, appointment can be canceled   Sleep Apnea Sleep apnea affects breathing during sleep. It causes breathing to stop for a short time or to become shallow. It can also increase the risk of:  Heart attack.  Stroke.  Being very overweight (obese).  Diabetes.  Heart failure.  Irregular heartbeat. The goal of treatment is to help you breathe normally again. What are the causes? There are three kinds of sleep apnea:  Obstructive sleep apnea. This is caused by a blocked or collapsed airway.  Central sleep apnea. This happens when the brain does not send the right signals to the muscles that control breathing.  Mixed sleep apnea. This is a combination of obstructive and central sleep apnea. The most common cause of this condition is a collapsed or blocked airway. This can happen if:  Your throat muscles are too relaxed.  Your tongue and tonsils are too large.  You are overweight.  Your airway is too small. What increases the risk?  Being overweight.  Smoking.  Having a small airway.  Being older.  Being female.  Drinking alcohol.  Taking medicines to calm yourself (sedatives or tranquilizers).  Having family members with the condition. What are the signs or symptoms?  Trouble staying asleep.  Being sleepy or tired during the day.  Getting angry a lot.  Loud snoring.  Headaches in the morning.  Not being able to focus your mind (concentrate).  Forgetting things.  Less interest in sex.  Mood  swings.  Personality changes.  Feelings of sadness (depression).  Waking up a lot during the night to pee (urinate).  Dry mouth.  Sore throat. How is this diagnosed?  Your medical history.  A physical exam.  A test that is done when you are sleeping (sleep study). The test is most often done in a sleep lab but may also be done at home. How is this treated?   Sleeping on your side.  Using a medicine to get rid of mucus in your nose (decongestant).  Avoiding the use of alcohol, medicines to help you relax, or certain pain medicines (narcotics).  Losing weight, if needed.  Changing your diet.  Not smoking.  Using a machine to open your airway while you sleep, such as: ? An oral appliance. This is a mouthpiece that shifts your lower jaw forward. ? A CPAP device. This device blows air through a mask when you breathe out (exhale). ? An EPAP device. This has valves that you put in each nostril. ? A BPAP device. This device blows air through a mask when you breathe in (inhale) and breathe out.  Having surgery if other treatments do not work. It is important to get treatment for sleep apnea. Without treatment, it can lead to:  High blood pressure.  Coronary artery disease.  In men, not being able to have an erection (impotence).  Reduced thinking ability. Follow these instructions at home: Lifestyle  Make changes that your doctor recommends.  Eat a healthy diet.  Lose weight if needed.  Avoid alcohol, medicines to help  you relax, and some pain medicines.  Do not use any products that contain nicotine or tobacco, such as cigarettes, e-cigarettes, and chewing tobacco. If you need help quitting, ask your doctor. General instructions  Take over-the-counter and prescription medicines only as told by your doctor.  If you were given a machine to use while you sleep, use it only as told by your doctor.  If you are having surgery, make sure to tell your doctor you  have sleep apnea. You may need to bring your device with you.  Keep all follow-up visits as told by your doctor. This is important. Contact a doctor if:  The machine that you were given to use during sleep bothers you or does not seem to be working.  You do not get better.  You get worse. Get help right away if:  Your chest hurts.  You have trouble breathing in enough air.  You have an uncomfortable feeling in your back, arms, or stomach.  You have trouble talking.  One side of your body feels weak.  A part of your face is hanging down. These symptoms may be an emergency. Do not wait to see if the symptoms will go away. Get medical help right away. Call your local emergency services (911 in the [place] Do not drive yourself to the hospital. Summary  This condition affects breathing during sleep.  The most common cause is a collapsed or blocked airway.  The goal of treatment is to help you breathe normally while you sleep. This information is not intended to replace advice given to you by your health care provider. Make sure you discuss any questions you have with your health care provider. Document Revised: 11/08/2017 Document Reviewed: 09/17/2017 Elsevier Patient Education  [place].

## 2019-12-23 NOTE — Telephone Encounter (Signed)
Pt insurance is active and benefits verified through [facility]. Co-pay $0.00, DED $500.00/$500.00 met, out of pocket $3,000.00/$3,000.00 met, co-insurance 20%. No pre-authorization required. Passport, 12/23/19 @ 2:38PM, SVE#[id number]  Will contact patient to see if she is interested in the Cardiac Rehab Program. If interested, patient will need to complete follow up appt. Once completed, patient will be contacted for scheduling upon review by the RN Navigator.

## 2019-12-23 NOTE — Telephone Encounter (Signed)
Called and spoke with pt in regards to CR, pt stated she is not interested at this time. Her and her husband has start walking.  Closed referral

## 2019-12-23 NOTE — Progress Notes (Signed)
Traci Roy    [id number]    October 11, 1960  Primary Care Physician:Webb, Arbie Cookey, MD  Referring Physician: Maurice Small, MD [place],  [place] [id number]  Chief complaint:    Patient seen for chest discomfort  HPI:  Was recently hospitalized for Covid infection, spent about 5 days in the hospital Recently had stent placement for coronary artery disease  She does have some chest discomfort along the right side whenever she sneezes or laughs Comes and goes  History of sleep onset insomnia for which she uses melatonin-this helps Admits to snoring Admits to nonrestorative sleep Spouse has obstructive sleep apnea, she is not as concerned about sleep apnea  She has managed to lose about 20 pounds recently since Covid She still feels fatigue  History of hypertension, angina, degenerative joint disease  Denies a chronic cough  Never smoker  Outpatient Encounter Medications as of 12/23/2019  Medication Sig  . aspirin EC 81 MG tablet Take 1 tablet (81 mg total) by mouth daily. Swallow whole.  Marland Kitchen atorvastatin (LIPITOR) 80 MG tablet Take 1 tablet (80 mg total) by mouth daily.  . clopidogrel (PLAVIX) 75 MG tablet Take 1 tablet (75 mg total) by mouth daily.  Marland Kitchen escitalopram (LEXAPRO) 20 MG tablet Take 20 mg by mouth daily.  . hydrochlorothiazide (HYDRODIURIL) 25 MG tablet Take 25 mg by mouth daily.  Marland Kitchen losartan (COZAAR) 25 MG tablet Take 1 tablet (25 mg total) by mouth daily. (Patient taking differently: Take 25 mg by mouth at bedtime. )  . Magnesium Oxide 250 MG TABS Take 250 mg by mouth at bedtime.   . melatonin 3 MG TABS tablet Take 1.5 mg by mouth at bedtime.  . metoprolol succinate (TOPROL-XL) 25 MG 24 hr tablet Take 1 tablet (25 mg total) by mouth daily.  . nitroGLYCERIN (NITROSTAT) 0.4 MG SL tablet Place 1 tablet (0.4 mg total) under the tongue every 5 (five) minutes as needed for chest pain.   No facility-administered encounter medications  on file as of 12/23/2019.    Allergies as of 12/23/2019 - Review Complete 12/23/2019  Allergen Reaction Noted  . Lisinopril Cough 02/01/2016  . Adhesive [tape] Itching and Rash 04/09/2013    Past Medical History:  Diagnosis Date  . Arthritis   . Depression    takes Lexapro daily  . Dizziness    occasionally and no meds  . Flu    end of Dec 2014  . GERD (gastroesophageal reflux disease)    occasionally and no meds required  . History of bronchitis    last time many yrs ago  . History of duodenal ulcer   . History of shingles 3-5 YRS AGO  . Hypertension    takes Losartan and HCTZ daily  . Joint pain   . Restless legs     Past Surgical History:  Procedure Laterality Date  . BREAST ENHANCEMENT SURGERY Bilateral   . CESAREAN SECTION     X 1  . COLONOSCOPY    . CORONARY STENT INTERVENTION N/A 12/18/2019   Procedure: CORONARY STENT INTERVENTION;  Surgeon: Jettie Booze, MD;  Location: [facility];  Service: Cardiovascular;  Laterality: N/A;  . enlarged bladder as a child     HAD SURGERY TO ENLARGE BLADDER  . INTRAVASCULAR PRESSURE WIRE/FFR STUDY N/A 12/18/2019   Procedure: INTRAVASCULAR PRESSURE WIRE/FFR STUDY;  Surgeon: Jettie Booze, MD;  Location: [facility];  Service: Cardiovascular;  Laterality: N/A;  . RIGHT/LEFT HEART CATH AND CORONARY ANGIOGRAPHY N/A 12/18/2019   Procedure: RIGHT/LEFT HEART CATH AND CORONARY ANGIOGRAPHY;  Surgeon: Jettie Booze, MD;  Location: [facility];  Service: Cardiovascular;  Laterality: N/A;  . TONSILLECTOMY    . TOTAL HIP ARTHROPLASTY Right 04/14/2013   Procedure: TOTAL HIP ARTHROPLASTY ANTERIOR APPROACH;  Surgeon: Hessie Dibble, MD;  Location: [facility];  Service: Orthopedics;  Laterality: Right;    No family history on file.  Social History   Socioeconomic History  . Marital status: Married    Spouse name: Not on file  . Number of children: Not on file  . Years of education: Not on file  .  Highest education level: Not on file  Occupational History  . Not on file  Tobacco Use  . Smoking status: Never Smoker  . Smokeless tobacco: Never Used  Substance and Sexual Activity  . Alcohol use: Yes    Comment: glass of wine every now and then  . Drug use: No  . Sexual activity: Yes    Birth control/protection: Other-see comments  Other Topics Concern  . Not on file  Social History Narrative  . Not on file   Social Determinants of Health   Financial Resource Strain:   . Difficulty of Paying Living Expenses: Not on file  Food Insecurity:   . Worried About Charity fundraiser in the Last Year: Not on file  . Ran Out of Food in the Last Year: Not on file  Transportation Needs:   . Lack of Transportation (Medical): Not on file  . Lack of Transportation (Non-Medical): Not on file  Physical Activity:   . Days of Exercise per Week: Not on file  . Minutes of Exercise per Session: Not on file  Stress:   . Feeling of Stress : Not on file  Social Connections:   . Frequency of Communication with Friends and Family: Not on file  . Frequency of Social Gatherings with Friends and Family: Not on file  . Attends Religious Services: Not on file  . Active Member of Clubs or Organizations: Not on file  . Attends Archivist Meetings: Not on file  . Marital Status: Not on file  Intimate Partner Violence:   . Fear of Current or Ex-Partner: Not on file  . Emotionally Abused: Not on file  . Physically Abused: Not on file  . Sexually Abused: Not on file    Review of Systems  Constitutional: Positive for fatigue.  Cardiovascular: Positive for chest pain.  Gastrointestinal: Negative.   Musculoskeletal: Negative.     Vitals:   12/23/19 1004  BP: 130/66  Pulse: 78  Temp: 97.8 F (36.6 C)  SpO2: 97%     Physical Exam Constitutional:      Appearance: She is obese.  HENT:     Head: Normocephalic.     Nose: Nose normal. No congestion.     Mouth/Throat:     Mouth:  Mucous membranes are moist.     Comments: Mallampati 4, crowded oropharynx Eyes:     Pupils: Pupils are equal, round, and reactive to light.  Neck:     Comments: Neck is thick Cardiovascular:     Rate and Rhythm: Normal rate and regular rhythm.     Pulses: Normal pulses.     Heart sounds: No murmur heard.  No friction rub.  Pulmonary:     Effort: No respiratory distress.     Breath sounds: No stridor.  No wheezing or rhonchi.  Musculoskeletal:     Cervical back: No rigidity or tenderness.  Neurological:     Mental Status: She is alert.  Psychiatric:        Mood and Affect: Mood normal.    Data Reviewed: CT scan of the chest reviewed with the patient compared with CT abdomen performed in 2015 Pleural thickening on the left, atelectasis and scarring -This is likely related to recent Covid infection and pneumonia  Assessment:  Chest discomfort  Recent Covid infection  Coronary artery disease  Hypertension  Obesity  Sleep onset insomnia  Plan/Recommendations: She may continue using melatonin as this works well for her  Possible sleep disordered breathing discussed, encouraged her to consider a sleep study if she feels she has significant symptoms  Regular exercise and diet encouraged  Continue current activity as tolerated  Tentatively we will follow-up in 3 months  I do not believe any other investigation needs performed regarding CT scan findings   Sherrilyn Rist MD [facility] 12/23/2019, 10:38 AM  CC: Maurice Small, MD

## 2019-12-30 ENCOUNTER — Encounter: Payer: Self-pay | Admitting: Cardiology

## 2019-12-30 ENCOUNTER — Ambulatory Visit: Payer: BC Managed Care – PPO | Admitting: Cardiology

## 2019-12-30 ENCOUNTER — Other Ambulatory Visit: Payer: Self-pay

## 2019-12-30 VITALS — BP 110/70 | HR 70 | Ht 64.0 in | Wt 239.0 lb

## 2019-12-30 DIAGNOSIS — I10 Essential (primary) hypertension: Secondary | ICD-10-CM

## 2019-12-30 DIAGNOSIS — I2510 Atherosclerotic heart disease of native coronary artery without angina pectoris: Secondary | ICD-10-CM | POA: Diagnosis not present

## 2019-12-30 NOTE — Progress Notes (Signed)
Cardiology Office Note:    Date:  12/30/2019   ID:  Traci Roy, DOB 10-29-1960, MRN [id number]  PCP:  Maurice Small, MD  [facility] Cardiologist:  Candee Furbish, MD  [facility] Electrophysiologist:  None   Referring MD: Maurice Small, MD     History of Present Illness:    Traci Roy is a 59 y.o. female here for the follow-up of dyspnea and chest discomfort post COVID.  We decided to undergo cardiac catheterization on 12/18/2019 that resulted in stent placement to the LAD.    EF showed 40-45%   She was diagnosed with Covid and admitted to the hospital on 10/19/2019.  She was discharged on 10/29/2019.  She was given antibiotics for superimposed pneumonia.  Discharged on home oxygen now weaned off.  Since her post hospital follow-up visit, she has been having continued shortness of breath as well as chest discomfort with exertion, quite significant.   No tob, no DM (borderline). Father has CAD, stent.  +HTN, usually higher than detected today.   She also has past medical history of chronic fatigue anxiety depression.  Hospital data reviewed, discharge summary reviewed, chest x-ray reviewed.   Past Medical History:  Diagnosis Date  . Arthritis   . Depression    takes Lexapro daily  . Dizziness    occasionally and no meds  . Flu    end of Dec 2014  . GERD (gastroesophageal reflux disease)    occasionally and no meds required  . History of bronchitis    last time many yrs ago  . History of duodenal ulcer   . History of shingles 3-5 YRS AGO  . Hypertension    takes Losartan and HCTZ daily  . Joint pain   . Restless legs     Past Surgical History:  Procedure Laterality Date  . BREAST ENHANCEMENT SURGERY Bilateral   . CESAREAN SECTION     X 1  . COLONOSCOPY    . CORONARY STENT INTERVENTION N/A 12/18/2019   Procedure: CORONARY STENT INTERVENTION;  Surgeon: Jettie Booze, MD;  Location: [facility];  Service: Cardiovascular;   Laterality: N/A;  . enlarged bladder as a child     HAD SURGERY TO ENLARGE BLADDER  . INTRAVASCULAR PRESSURE WIRE/FFR STUDY N/A 12/18/2019   Procedure: INTRAVASCULAR PRESSURE WIRE/FFR STUDY;  Surgeon: Jettie Booze, MD;  Location: [facility];  Service: Cardiovascular;  Laterality: N/A;  . RIGHT/LEFT HEART CATH AND CORONARY ANGIOGRAPHY N/A 12/18/2019   Procedure: RIGHT/LEFT HEART CATH AND CORONARY ANGIOGRAPHY;  Surgeon: Jettie Booze, MD;  Location: [facility];  Service: Cardiovascular;  Laterality: N/A;  . TONSILLECTOMY    . TOTAL HIP ARTHROPLASTY Right 04/14/2013   Procedure: TOTAL HIP ARTHROPLASTY ANTERIOR APPROACH;  Surgeon: Hessie Dibble, MD;  Location: [facility];  Service: Orthopedics;  Laterality: Right;    Current Medications: Current Meds  Medication Sig  . aspirin EC 81 MG tablet Take 1 tablet (81 mg total) by mouth daily. Swallow whole.  Marland Kitchen atorvastatin (LIPITOR) 80 MG tablet Take 1 tablet (80 mg total) by mouth daily.  . clopidogrel (PLAVIX) 75 MG tablet Take 1 tablet (75 mg total) by mouth daily.  Marland Kitchen escitalopram (LEXAPRO) 20 MG tablet Take 20 mg by mouth daily.  . hydrochlorothiazide (HYDRODIURIL) 25 MG tablet Take 25 mg by mouth daily.  Marland Kitchen losartan (COZAAR) 25 MG tablet Take 1 tablet (25 mg total) by mouth daily.  . Magnesium Oxide 250 MG TABS  Take 250 mg by mouth at bedtime.   . melatonin 3 MG TABS tablet Take 1.5 mg by mouth at bedtime.  . metoprolol succinate (TOPROL-XL) 25 MG 24 hr tablet Take 1 tablet (25 mg total) by mouth daily.  . nitroGLYCERIN (NITROSTAT) 0.4 MG SL tablet Place 1 tablet (0.4 mg total) under the tongue every 5 (five) minutes as needed for chest pain.     Allergies:   Lisinopril and Adhesive [tape]   Social History   Socioeconomic History  . Marital status: Married    Spouse name: Not on file  . Number of children: Not on file  . Years of education: Not on file  . Highest education level: Not on file  Occupational  History  . Not on file  Tobacco Use  . Smoking status: Never Smoker  . Smokeless tobacco: Never Used  Substance and Sexual Activity  . Alcohol use: Yes    Comment: glass of wine every now and then  . Drug use: No  . Sexual activity: Yes    Birth control/protection: Other-see comments  Other Topics Concern  . Not on file  Social History Narrative  . Not on file   Social Determinants of Health   Financial Resource Strain:   . Difficulty of Paying Living Expenses: Not on file  Food Insecurity:   . Worried About Charity fundraiser in the Last Year: Not on file  . Ran Out of Food in the Last Year: Not on file  Transportation Needs:   . Lack of Transportation (Medical): Not on file  . Lack of Transportation (Non-Medical): Not on file  Physical Activity:   . Days of Exercise per Week: Not on file  . Minutes of Exercise per Session: Not on file  Stress:   . Feeling of Stress : Not on file  Social Connections:   . Frequency of Communication with Friends and Family: Not on file  . Frequency of Social Gatherings with Friends and Family: Not on file  . Attends Religious Services: Not on file  . Active Member of Clubs or Organizations: Not on file  . Attends Archivist Meetings: Not on file  . Marital Status: Not on file     Family History: The patient's Family history is unknown by patient.  ROS:   Please see the history of present illness.    No bleeding.  No syncope no orthopnea.  All other systems reviewed and are negative.  EKGs/Labs/Other Studies Reviewed:    The following studies were reviewed today:  Cath 12/18/19:  2nd Diag lesion is 50% stenosed.  Mid LAD lesion is 70% stenosed. Significant by DFR.  A drug-eluting stent was successfully placed using a STENT RESOLUTE ONYX 3.0X26, postdilated to 3.75.  Post intervention, there is a 0% residual stenosis.  The left ventricular ejection fraction is 50-55% by visual estimate.  LV end diastolic pressure  is normal.  The left ventricular systolic function is normal.  There is no aortic valve stenosis.  Ao sat 97%, PA sat 72%, PA pressure 35/14, mean PA pressure 24 mm Hg, mean PCWP 11 mm Hg; CO 6.3 L/min; CI 3.01  Normal right heart pressures.  EBU 3.5 would have been a better choice of guide catheter.   Continue dual antiplatelet therapy along with aggressive secondary prevention.    Plan for same day discharge.  Diagnostic Dominance: Right  Intervention    Recent Labs: 10/27/2019: ALT 43 10/28/2019: Magnesium 2.1 12/14/2019: BUN 16; Creatinine, Ser 0.79;  Platelets 238 12/18/2019: Hemoglobin 12.2; Potassium 3.7; Sodium 141  Recent Lipid Panel No results found for: CHOL, TRIG, HDL, CHOLHDL, VLDL, LDLCALC, LDLDIRECT   Physical Exam:    VS:  BP 110/70   Pulse 70   Ht 5\' 4"  (1.626 m)   Wt 239 lb (108.4 kg)   LMP 02/05/2014   SpO2 96%   BMI 41.02 kg/m     Wt Readings from Last 3 Encounters:  12/30/19 239 lb (108.4 kg)  12/23/19 238 lb (108 kg)  12/18/19 238 lb (108 kg)     GEN:  Well nourished, well developed in no acute distress HEENT: Normal NECK: No JVD; No carotid bruits LYMPHATICS: No lymphadenopathy CARDIAC: RRR, no murmurs, rubs, gallops RESPIRATORY:  Clear to auscultation without rales, wheezing or rhonchi  ABDOMEN: Soft, non-tender, non-distended MUSCULOSKELETAL:  No edema; No deformity  SKIN: Warm and dry NEUROLOGIC:  Alert and oriented x 3 PSYCHIATRIC:  Normal affect   ASSESSMENT:    1. Coronary artery disease involving native coronary artery of native heart without angina pectoris   2. Essential hypertension    PLAN:    In order of problems listed above:  Coronary artery disease -LAD stent placed 12/18/2019. -Dual antiplatelet therapy discussed.  Hyperlipidemia -Continue atorvastatin 80 mg.  Goal LDL less than 70.  We will check at next visit.  Essential hypertension -On losartan HCTZ and Toprol.  No changes made.  Had a lengthy  discussion today great questions.   Medication Adjustments/Labs and Tests Ordered: Current medicines are reviewed at length with the patient today.  Concerns regarding medicines are outlined above.  No orders of the defined types were placed in this encounter.  No orders of the defined types were placed in this encounter.   Patient Instructions  Medication Instructions:  The current medical regimen is effective;  continue present plan and medications.  *If you need a refill on your cardiac medications before your next appointment, please call your pharmacy*  Follow-Up: At [facility], you and your health needs are our priority.  As part of our continuing mission to provide you with exceptional heart care, we have created designated Provider Care Teams.  These Care Teams include your primary Cardiologist (physician) and Advanced Practice Providers (APPs -  Physician Assistants and Nurse Practitioners) who all work together to provide you with the care you need, when you need it.  We recommend signing up for the patient portal called "MyChart".  Sign up information is provided on this After Visit Summary.  MyChart is used to connect with patients for Virtual Visits (Telemedicine).  Patients are able to view lab/test results, encounter notes, upcoming appointments, etc.  Non-urgent messages can be sent to your provider as well.   To learn more about what you can do with MyChart, go to [email or web address].    Your next appointment:   4 month(s)  The format for your next appointment:   In Person  Provider:   Candee Furbish, MD   Thank you for choosing [facility]!!         Signed, Candee Furbish, MD  12/30/2019 5:11 PM    [place]

## 2019-12-30 NOTE — Patient Instructions (Signed)
Medication Instructions:  The current medical regimen is effective;  continue present plan and medications.  *If you need a refill on your cardiac medications before your next appointment, please call your pharmacy*  Follow-Up: At [facility], you and your health needs are our priority.  As part of our continuing mission to provide you with exceptional heart care, we have created designated Provider Care Teams.  These Care Teams include your primary Cardiologist (physician) and Advanced Practice Providers (APPs -  Physician Assistants and Nurse Practitioners) who all work together to provide you with the care you need, when you need it.  We recommend signing up for the patient portal called "MyChart".  Sign up information is provided on this After Visit Summary.  MyChart is used to connect with patients for Virtual Visits (Telemedicine).  Patients are able to view lab/test results, encounter notes, upcoming appointments, etc.  Non-urgent messages can be sent to your provider as well.   To learn more about what you can do with MyChart, go to [email or web address].    Your next appointment:   4 month(s)  The format for your next appointment:   In Person  Provider:   Mark Skains, MD   Thank you for choosing [facility]!!     

## 2020-01-12 DIAGNOSIS — K7689 Other specified diseases of liver: Secondary | ICD-10-CM | POA: Diagnosis not present

## 2020-02-12 DIAGNOSIS — D225 Melanocytic nevi of trunk: Secondary | ICD-10-CM | POA: Diagnosis not present

## 2020-02-12 DIAGNOSIS — L578 Other skin changes due to chronic exposure to nonionizing radiation: Secondary | ICD-10-CM | POA: Diagnosis not present

## 2020-02-12 DIAGNOSIS — L821 Other seborrheic keratosis: Secondary | ICD-10-CM | POA: Diagnosis not present

## 2020-02-12 DIAGNOSIS — D485 Neoplasm of uncertain behavior of skin: Secondary | ICD-10-CM | POA: Diagnosis not present

## 2020-02-12 DIAGNOSIS — R531 Weakness: Secondary | ICD-10-CM | POA: Diagnosis not present

## 2020-02-12 DIAGNOSIS — L820 Inflamed seborrheic keratosis: Secondary | ICD-10-CM | POA: Diagnosis not present

## 2020-02-12 DIAGNOSIS — L650 Telogen effluvium: Secondary | ICD-10-CM | POA: Diagnosis not present

## 2020-02-19 DIAGNOSIS — Z20822 Contact with and (suspected) exposure to covid-19: Secondary | ICD-10-CM | POA: Diagnosis not present

## 2020-02-26 DIAGNOSIS — K7689 Other specified diseases of liver: Secondary | ICD-10-CM | POA: Diagnosis not present

## 2020-02-26 DIAGNOSIS — E538 Deficiency of other specified B group vitamins: Secondary | ICD-10-CM | POA: Diagnosis not present

## 2020-02-26 DIAGNOSIS — L659 Nonscarring hair loss, unspecified: Secondary | ICD-10-CM | POA: Diagnosis not present

## 2020-02-26 DIAGNOSIS — E611 Iron deficiency: Secondary | ICD-10-CM | POA: Diagnosis not present

## 2020-03-18 DIAGNOSIS — L578 Other skin changes due to chronic exposure to nonionizing radiation: Secondary | ICD-10-CM | POA: Diagnosis not present

## 2020-03-18 DIAGNOSIS — D485 Neoplasm of uncertain behavior of skin: Secondary | ICD-10-CM | POA: Diagnosis not present

## 2020-03-18 DIAGNOSIS — L638 Other alopecia areata: Secondary | ICD-10-CM | POA: Diagnosis not present

## 2020-04-01 DIAGNOSIS — I2510 Atherosclerotic heart disease of native coronary artery without angina pectoris: Secondary | ICD-10-CM | POA: Diagnosis not present

## 2020-04-29 DIAGNOSIS — J0110 Acute frontal sinusitis, unspecified: Secondary | ICD-10-CM | POA: Diagnosis not present

## 2020-05-06 ENCOUNTER — Other Ambulatory Visit: Payer: Self-pay

## 2020-05-06 ENCOUNTER — Encounter: Payer: Self-pay | Admitting: Cardiology

## 2020-05-06 ENCOUNTER — Ambulatory Visit: Payer: BC Managed Care – PPO | Admitting: Cardiology

## 2020-05-06 VITALS — BP 120/70 | HR 70 | Ht 64.0 in | Wt 242.0 lb

## 2020-05-06 DIAGNOSIS — I10 Essential (primary) hypertension: Secondary | ICD-10-CM

## 2020-05-06 DIAGNOSIS — I2510 Atherosclerotic heart disease of native coronary artery without angina pectoris: Secondary | ICD-10-CM | POA: Diagnosis not present

## 2020-05-06 NOTE — Progress Notes (Signed)
Cardiology Office Note:    Date:  05/06/2020   ID:  Traci Roy, DOB May 04, 1960, MRN [id number]  PCP:  Maurice Small, MD   [place]  Cardiologist:  Candee Furbish, MD  Advanced Practice Provider:  No care team member to display Electrophysiologist:  None       Referring MD: Maurice Small, MD    History of Present Illness:    Traci Roy is a 60 y.o. female here for the follow-up of coronary artery disease status post stent placement to the LAD on 12/18/2019.  Ejection fraction at that time 40 to 45%.  Prior hospitalization with COVID in September 2021.  She was previously having continued shortness of breath as well as chest discomfort.  No smoker, no diabetes, father did have coronary disease and had a prior stent.  Her past medical history includes chronic fatigue anxiety and depression.    Past Medical History:  Diagnosis Date  . Arthritis   . Depression    takes Lexapro daily  . Dizziness    occasionally and no meds  . Flu    end of Dec 2014  . GERD (gastroesophageal reflux disease)    occasionally and no meds required  . History of bronchitis    last time many yrs ago  . History of duodenal ulcer   . History of shingles 3-5 YRS AGO  . Hypertension    takes Losartan and HCTZ daily  . Joint pain   . Restless legs     Past Surgical History:  Procedure Laterality Date  . BREAST ENHANCEMENT SURGERY Bilateral   . CESAREAN SECTION     X 1  . COLONOSCOPY    . CORONARY STENT INTERVENTION N/A 12/18/2019   Procedure: CORONARY STENT INTERVENTION;  Surgeon: Jettie Booze, MD;  Location: [facility] [place];  Service: Cardiovascular;  Laterality: N/A;  . enlarged bladder as a child     HAD SURGERY TO ENLARGE BLADDER  . INTRAVASCULAR PRESSURE WIRE/FFR STUDY N/A 12/18/2019   Procedure: INTRAVASCULAR PRESSURE WIRE/FFR STUDY;  Surgeon: Jettie Booze, MD;  Location: [place] [facility];  Service: Cardiovascular;  Laterality: N/A;  .  RIGHT/LEFT HEART CATH AND CORONARY ANGIOGRAPHY N/A 12/18/2019   Procedure: RIGHT/LEFT HEART CATH AND CORONARY ANGIOGRAPHY;  Surgeon: Jettie Booze, MD;  Location: [place] [facility];  Service: Cardiovascular;  Laterality: N/A;  . TONSILLECTOMY    . TOTAL HIP ARTHROPLASTY Right 04/14/2013   Procedure: TOTAL HIP ARTHROPLASTY ANTERIOR APPROACH;  Surgeon: Hessie Dibble, MD;  Location: [facility];  Service: Orthopedics;  Laterality: Right;    Current Medications: Current Meds  Medication Sig  . aspirin EC 81 MG tablet Take 1 tablet (81 mg total) by mouth daily. Swallow whole.  Marland Kitchen atorvastatin (LIPITOR) 40 MG tablet Take 40 mg by mouth daily.  Marland Kitchen escitalopram (LEXAPRO) 20 MG tablet Take 20 mg by mouth daily.  Marland Kitchen losartan (COZAAR) 25 MG tablet Take 1 tablet (25 mg total) by mouth daily.  . Magnesium Oxide 250 MG TABS Take 250 mg by mouth at bedtime.  . nitroGLYCERIN (NITROSTAT) 0.4 MG SL tablet Place 1 tablet (0.4 mg total) under the tongue every 5 (five) minutes as needed for chest pain.  . [DISCONTINUED] clopidogrel (PLAVIX) 75 MG tablet Take 1 tablet (75 mg total) by mouth daily.  . [DISCONTINUED] hydrochlorothiazide (HYDRODIURIL) 25 MG tablet Take 25 mg by mouth daily.     Allergies:   Lisinopril and Adhesive [tape]  Social History   Socioeconomic History  . Marital status: Married    Spouse name: Not on file  . Number of children: Not on file  . Years of education: Not on file  . Highest education level: Not on file  Occupational History  . Not on file  Tobacco Use  . Smoking status: Never Smoker  . Smokeless tobacco: Never Used  Substance and Sexual Activity  . Alcohol use: Yes    Comment: glass of wine every now and then  . Drug use: No  . Sexual activity: Yes    Birth control/protection: Other-see comments  Other Topics Concern  . Not on file  Social History Narrative  . Not on file   Social Determinants of Health   Financial Resource Strain: Not on file  Food  Insecurity: Not on file  Transportation Needs: Not on file  Physical Activity: Not on file  Stress: Not on file  Social Connections: Not on file     Family History: The patient's Family history is unknown by patient.  ROS:   Please see the history of present illness.     All other systems reviewed and are negative.  EKGs/Labs/Other Studies Reviewed:    The following studies were reviewed today:  Cath 12/18/19:  2nd Diag lesion is 50% stenosed.  Mid LAD lesion is 70% stenosed. Significant by DFR.  A drug-eluting stent was successfully placed using a STENT RESOLUTE ONYX 3.0X26, postdilated to 3.75.  Post intervention, there is a 0% residual stenosis.  The left ventricular ejection fraction is 50-55% by visual estimate.  LV end diastolic pressure is normal.  The left ventricular systolic function is normal.  There is no aortic valve stenosis.  Ao sat 97%, PA sat 72%, PA pressure 35/14, mean PA pressure 24 mm Hg, mean PCWP 11 mm Hg; CO 6.3 L/min; CI 3.01  Normal right heart pressures.  EBU 3.5 would have been a better choice of guide catheter.  Continue dual antiplatelet therapy along with aggressive secondary prevention.   Plan for same day discharge.  Diagnostic Dominance: Right  Intervention    ECHO 12/02/19:   1. Left ventricular ejection fraction, by estimation, is 40 to 45%. The  left ventricle has mildly decreased function. The left ventricle  demonstrates global hypokinesis. The left ventricular internal cavity size  was mildly dilated. Left ventricular  diastolic parameters were normal.  2. Right ventricular systolic function is normal. The right ventricular  size is normal. There is normal pulmonary artery systolic pressure.  3. Left atrial size was mildly dilated.  4. The mitral valve is normal in structure. No evidence of mitral valve  regurgitation. No evidence of mitral stenosis.  5. The aortic valve is tricuspid. Aortic valve  regurgitation is not  visualized. Mild aortic valve sclerosis is present, with no evidence of  aortic valve stenosis.  6. The inferior vena cava is normal in size with greater than 50%  respiratory variability, suggesting right atrial pressure of 3 mmHg.    Recent Labs: 10/27/2019: ALT 43 10/28/2019: Magnesium 2.1 12/14/2019: BUN 16; Creatinine, Ser 0.79; Platelets 238 12/18/2019: Hemoglobin 12.2; Potassium 3.7; Sodium 141  Recent Lipid Panel No results found for: CHOL, TRIG, HDL, CHOLHDL, VLDL, LDLCALC, LDLDIRECT   Risk Assessment/Calculations:      Physical Exam:    VS:  BP 120/70 (BP Location: Left Arm, Patient Position: Sitting, Cuff Size: Normal)   Pulse 70   Ht 5\' 4"  (1.626 m)   Wt 242 lb (109.8 kg)  LMP 02/05/2014   SpO2 97%   BMI 41.54 kg/m     Wt Readings from Last 3 Encounters:  05/06/20 242 lb (109.8 kg)  12/30/19 239 lb (108.4 kg)  12/23/19 238 lb (108 kg)     GEN:  Well nourished, well developed in no acute distress HEENT: Normal NECK: No JVD; No carotid bruits LYMPHATICS: No lymphadenopathy CARDIAC: RRR, no murmurs, rubs, gallops RESPIRATORY:  Clear to auscultation without rales, wheezing or rhonchi  ABDOMEN: Soft, non-tender, non-distended MUSCULOSKELETAL:  No edema; No deformity  SKIN: Warm and dry NEUROLOGIC:  Alert and oriented x 3 PSYCHIATRIC:  Normal affect   ASSESSMENT:    1. Coronary artery disease involving native coronary artery of native heart without angina pectoris   2. Essential hypertension    PLAN:    In order of problems listed above:  Coronary artery disease -LAD stent 12/18/2019.  Dual antiplatelet therapy.  Goal-directed medical therapy.  Overall doing well.  She is requesting to come off of some of her medications.  She would like to come off of Plavix.  Since she is close to 6 months out of new generation DES, I think this is reasonable ask.  We will stop Plavix and continue with aspirin lifelong.  81  mg.  Hyperlipidemia -On atorvastatin 40 mg high intensity dose.  LDL goal less than 70.  Especially importance of this medication for plaque stabilization, reduction of myocardial infarction risks.  Essential hypertension on losartan chlorothiazide and Toprol. -She decided to stop the Toprol 25 mg because of fatigue.  She does not like taking medications. -Her blood pressure today is 120/70.  She has to come off of another blood pressure pill.  And compromise, I will stop her hydrochlorothiazide and continue her losartan.  She has a blood pressure monitor at home.  Continue to monitor.  Ischemic cardiomyopathy -We will continue with losartan.  Recheck echocardiogram to see how ejection fraction has fared.  Previously 40 to 45% prior to stent placement.    1 yr    Medication Adjustments/Labs and Tests Ordered: Current medicines are reviewed at length with the patient today.  Concerns regarding medicines are outlined above.  Orders Placed This Encounter  Procedures  . ECHOCARDIOGRAM COMPLETE   No orders of the defined types were placed in this encounter.   Patient Instructions  Medication Instructions:  You may discontinue your Hydrochlorothiazide and Plavix.  Continue all other medications as listed.  *If you need a refill on your cardiac medications before your next appointment, please call your pharmacy*  Testing/Procedures: Your physician has requested that you have an echocardiogram. Echocardiography is a painless test that uses sound waves to create images of your heart. It provides your doctor with information about the size and shape of your heart and how well your heart's chambers and valves are working. This procedure takes approximately one hour. There are no restrictions for this procedure.  Follow-Up: At [facility], you and your health needs are our priority.  As part of our continuing mission to provide you with exceptional heart care, we have created designated  Provider Care Teams.  These Care Teams include your primary Cardiologist (physician) and Advanced Practice Providers (APPs -  Physician Assistants and Nurse Practitioners) who all work together to provide you with the care you need, when you need it.  We recommend signing up for the patient portal called "MyChart".  Sign up information is provided on this After Visit Summary.  MyChart is used to connect with  patients for Virtual Visits (Telemedicine).  Patients are able to view lab/test results, encounter notes, upcoming appointments, etc.  Non-urgent messages can be sent to your provider as well.   To learn more about what you can do with MyChart, go to [email or web address].    Your next appointment:   12 month(s)  The format for your next appointment:   In Person  Provider:   Candee Furbish, MD   Thank you for choosing [facility]!!         Signed, Candee Furbish, MD  05/06/2020 10:09 AM    [place]

## 2020-05-06 NOTE — Patient Instructions (Signed)
Medication Instructions:  You may discontinue your Hydrochlorothiazide and Plavix.  Continue all other medications as listed.  *If you need a refill on your cardiac medications before your next appointment, please call your pharmacy*  Testing/Procedures: Your physician has requested that you have an echocardiogram. Echocardiography is a painless test that uses sound waves to create images of your heart. It provides your doctor with information about the size and shape of your heart and how well your heart's chambers and valves are working. This procedure takes approximately one hour. There are no restrictions for this procedure.  Follow-Up: At [facility], you and your health needs are our priority.  As part of our continuing mission to provide you with exceptional heart care, we have created designated Provider Care Teams.  These Care Teams include your primary Cardiologist (physician) and Advanced Practice Providers (APPs -  Physician Assistants and Nurse Practitioners) who all work together to provide you with the care you need, when you need it.  We recommend signing up for the patient portal called "MyChart".  Sign up information is provided on this After Visit Summary.  MyChart is used to connect with patients for Virtual Visits (Telemedicine).  Patients are able to view lab/test results, encounter notes, upcoming appointments, etc.  Non-urgent messages can be sent to your provider as well.   To learn more about what you can do with MyChart, go to [email or web address].    Your next appointment:   12 month(s)  The format for your next appointment:   In Person  Provider:   Candee Furbish, MD   Thank you for choosing [facility]!!

## 2020-06-02 DIAGNOSIS — J0100 Acute maxillary sinusitis, unspecified: Secondary | ICD-10-CM | POA: Diagnosis not present

## 2020-06-17 ENCOUNTER — Other Ambulatory Visit (HOSPITAL_COMMUNITY): Payer: BC Managed Care – PPO

## 2020-06-21 DIAGNOSIS — R059 Cough, unspecified: Secondary | ICD-10-CM | POA: Diagnosis not present

## 2020-06-25 DIAGNOSIS — Z20822 Contact with and (suspected) exposure to covid-19: Secondary | ICD-10-CM | POA: Diagnosis not present

## 2020-07-01 DIAGNOSIS — R5382 Chronic fatigue, unspecified: Secondary | ICD-10-CM | POA: Diagnosis not present

## 2020-07-01 DIAGNOSIS — R399 Unspecified symptoms and signs involving the genitourinary system: Secondary | ICD-10-CM | POA: Diagnosis not present

## 2020-07-01 DIAGNOSIS — E538 Deficiency of other specified B group vitamins: Secondary | ICD-10-CM | POA: Diagnosis not present

## 2020-07-22 ENCOUNTER — Other Ambulatory Visit (HOSPITAL_COMMUNITY): Payer: BC Managed Care – PPO

## 2020-07-22 ENCOUNTER — Encounter (HOSPITAL_COMMUNITY): Payer: Self-pay | Admitting: Cardiology

## 2020-08-01 ENCOUNTER — Telehealth (HOSPITAL_COMMUNITY): Payer: Self-pay | Admitting: Cardiology

## 2020-08-01 NOTE — Telephone Encounter (Signed)
Just an FYI. We have made several attempts to contact this patient including sending a letter to schedule or reschedule their echocardiogram. We will be removing the patient from the echo WQ.  07/22/20 NO SHOWED-MAILED LETTER [facility]     Thank you

## 2020-08-26 DIAGNOSIS — F331 Major depressive disorder, recurrent, moderate: Secondary | ICD-10-CM | POA: Diagnosis not present

## 2020-08-26 DIAGNOSIS — Z6841 Body Mass Index (BMI) 40.0 and over, adult: Secondary | ICD-10-CM | POA: Diagnosis not present

## 2020-08-30 DIAGNOSIS — F4321 Adjustment disorder with depressed mood: Secondary | ICD-10-CM | POA: Diagnosis not present

## 2020-09-09 DIAGNOSIS — L821 Other seborrheic keratosis: Secondary | ICD-10-CM | POA: Diagnosis not present

## 2020-09-09 DIAGNOSIS — L578 Other skin changes due to chronic exposure to nonionizing radiation: Secondary | ICD-10-CM | POA: Diagnosis not present

## 2020-09-09 DIAGNOSIS — L820 Inflamed seborrheic keratosis: Secondary | ICD-10-CM | POA: Diagnosis not present

## 2020-09-09 DIAGNOSIS — D224 Melanocytic nevi of scalp and neck: Secondary | ICD-10-CM | POA: Diagnosis not present

## 2020-09-12 DIAGNOSIS — F329 Major depressive disorder, single episode, unspecified: Secondary | ICD-10-CM | POA: Diagnosis not present

## 2020-09-12 DIAGNOSIS — F430 Acute stress reaction: Secondary | ICD-10-CM | POA: Diagnosis not present

## 2020-09-12 DIAGNOSIS — R5381 Other malaise: Secondary | ICD-10-CM | POA: Diagnosis not present

## 2020-10-07 DIAGNOSIS — F329 Major depressive disorder, single episode, unspecified: Secondary | ICD-10-CM | POA: Diagnosis not present

## 2020-10-14 DIAGNOSIS — F329 Major depressive disorder, single episode, unspecified: Secondary | ICD-10-CM | POA: Diagnosis not present

## 2020-10-28 ENCOUNTER — Other Ambulatory Visit: Payer: Self-pay

## 2020-10-28 ENCOUNTER — Ambulatory Visit (HOSPITAL_COMMUNITY): Payer: BC Managed Care – PPO | Attending: Cardiology

## 2020-10-28 DIAGNOSIS — I10 Essential (primary) hypertension: Secondary | ICD-10-CM

## 2020-10-28 DIAGNOSIS — I2510 Atherosclerotic heart disease of native coronary artery without angina pectoris: Secondary | ICD-10-CM

## 2020-10-28 LAB — ECHOCARDIOGRAM COMPLETE
Area-P 1/2: 3.89 cm2
S' Lateral: 4.1 cm

## 2020-10-29 ENCOUNTER — Telehealth: Payer: Self-pay | Admitting: Cardiology

## 2020-10-29 ENCOUNTER — Other Ambulatory Visit: Payer: Self-pay | Admitting: Cardiology

## 2020-10-29 MED ORDER — SACUBITRIL-VALSARTAN 24-26 MG PO TABS
1.0000 | ORAL_TABLET | Freq: Two times a day (BID) | ORAL | Status: DC
Start: 2020-10-29 — End: 2020-11-29

## 2020-10-29 NOTE — Telephone Encounter (Signed)
Pt called very concerned about her EF 40-45% - I explained about heart failure and found she was not taking losartan but HCTZ, discussed importance of ARB to improve heart action.   Explained we would add entresto instead to help improve the heart pump action.    Triage: She will need BMP on Monday, she works until 3:30 pm,  please order and arrange a time for her.  She will need repeat BMP in 2 weeks and appt with pharmacy for meds, then APP appt in 2-3 months.  Thanks.

## 2020-10-31 ENCOUNTER — Other Ambulatory Visit: Payer: Self-pay

## 2020-10-31 ENCOUNTER — Telehealth: Payer: Self-pay

## 2020-10-31 ENCOUNTER — Other Ambulatory Visit: Payer: BC Managed Care – PPO | Admitting: *Deleted

## 2020-10-31 DIAGNOSIS — Z79899 Other long term (current) drug therapy: Secondary | ICD-10-CM

## 2020-10-31 DIAGNOSIS — I10 Essential (primary) hypertension: Secondary | ICD-10-CM

## 2020-10-31 DIAGNOSIS — I2510 Atherosclerotic heart disease of native coronary artery without angina pectoris: Secondary | ICD-10-CM

## 2020-10-31 NOTE — Telephone Encounter (Signed)
This message has been addressed in telephone encounter dated 10/29/2020 by Cecilie Kicks.  See Encounter for complete details.

## 2020-10-31 NOTE — Telephone Encounter (Signed)
Please see telephone encounter dated 10/31/2020.

## 2020-10-31 NOTE — Telephone Encounter (Signed)
Attempted phone call to pt and left voicemail message to contact triage at [phone] per Traci Roy phone conversation with pt over the weekend.  See Traci Roy note below.  Pt called very concerned about her EF 40-45% - I explained about heart failure and found she was not taking losartan but HCTZ, discussed importance of ARB to improve heart action.   Explained we would add entresto instead to help improve the heart pump action.     Triage: She will need BMP on Monday, she works until 3:30 pm,  please order and arrange a time for her.  She will need repeat BMP in 2 weeks and appt with pharmacy for meds, then APP appt in 2-3 months.  Thanks.

## 2020-11-01 LAB — BASIC METABOLIC PANEL
BUN/Creatinine Ratio: 17 (ref 12–28)
BUN: 17 mg/dL (ref 8–27)
CO2: 22 mmol/L (ref 20–29)
Calcium: 9.4 mg/dL (ref 8.7–10.3)
Chloride: 106 mmol/L (ref 96–106)
Creatinine, Ser: 1.02 mg/dL — ABNORMAL HIGH (ref 0.57–1.00)
Glucose: 114 mg/dL — ABNORMAL HIGH (ref 70–99)
Potassium: 4 mmol/L (ref 3.5–5.2)
Sodium: 145 mmol/L — ABNORMAL HIGH (ref 134–144)
eGFR: 63 mL/min/{1.73_m2} (ref >59)

## 2020-11-02 NOTE — Telephone Encounter (Signed)
Called patient back, patient upset about echo findings. Made patient an appointment with Dr. Marlou Porch on Friday to discuss.

## 2020-11-02 NOTE — Telephone Encounter (Signed)
Patient states she would prefer a call back rather than mychart message responses.

## 2020-11-03 ENCOUNTER — Ambulatory Visit: Payer: BC Managed Care – PPO | Admitting: Cardiology

## 2020-11-04 ENCOUNTER — Encounter: Payer: Self-pay | Admitting: Cardiology

## 2020-11-04 ENCOUNTER — Other Ambulatory Visit: Payer: Self-pay

## 2020-11-04 ENCOUNTER — Ambulatory Visit: Payer: BC Managed Care – PPO | Admitting: Cardiology

## 2020-11-04 VITALS — BP 120/80 | HR 74 | Ht 64.0 in | Wt 249.0 lb

## 2020-11-04 DIAGNOSIS — I519 Heart disease, unspecified: Secondary | ICD-10-CM | POA: Diagnosis not present

## 2020-11-04 DIAGNOSIS — I2510 Atherosclerotic heart disease of native coronary artery without angina pectoris: Secondary | ICD-10-CM

## 2020-11-04 DIAGNOSIS — E7800 Pure hypercholesterolemia, unspecified: Secondary | ICD-10-CM

## 2020-11-04 DIAGNOSIS — I10 Essential (primary) hypertension: Secondary | ICD-10-CM | POA: Diagnosis not present

## 2020-11-04 NOTE — Progress Notes (Signed)
Cardiology Office Note:    Date:  11/06/2020   ID:  Traci Roy, DOB 09-15-1960, MRN [id number]  PCP:  Maurice Small, MD   [place]  Cardiologist:  Candee Furbish, MD  Advanced Practice Provider:  No care team member to display Electrophysiologist:  None       Referring MD: Maurice Small, MD    History of Present Illness:    Traci Roy is a 60 y.o. female here for the follow-up of coronary artery disease and for the discussion of recent Echo results (10/2020).  Traci Roy is status post stent placement to the LAD on 12/18/2019.  Ejection fraction at that time 40 to 45%.  Prior hospitalization with COVID in September 2021.  She was previously having continued shortness of breath as well as chest discomfort.  No smoker, no diabetes, father did have coronary disease and had a prior stent.  Her past medical history includes chronic fatigue anxiety and depression.  Today: She is accompanied by a family member. We reviewed her Echo (10/2020) in detail.  She was quite concerned that her expected mortality was dramatically increased from report findings.  EF 45%, described as mildly reduced.  Has been stable.  NYHA class I type symptoms.  In all, reassurance has been given.  Overall, she appears well. For the past few months she has experienced increased fatigue and low energy, which she attributes to a side effect of Entresto. She stopped taking HCTZ when she began [place].  Takes 1/2 a tablet of Xanax at night to help her sleep. She never wakes up feeling well-rested. Frequently she has vivid dreams at night. A sleep study was not completed due to prohibitive costs.  She reports that she was told she had a previous heart attack at some point.  Currently she is taking Magnesium, B3, and B12 supplements.  She denies any palpitations, chest Roy, or shortness of breath. No lightheadedness, headaches, syncope, orthopnea, or PND. Also has no lower extremity  edema or exertional symptoms.  Past Medical History:  Diagnosis Date   Arthritis    Depression    takes Lexapro daily   Dizziness    occasionally and no meds   Flu    end of Dec 2014   GERD (gastroesophageal reflux disease)    occasionally and no meds required   History of bronchitis    last time many yrs ago   History of duodenal ulcer    History of shingles 3-5 YRS AGO   Hypertension    takes Losartan and HCTZ daily   Joint Roy    Restless legs     Past Surgical History:  Procedure Laterality Date   BREAST ENHANCEMENT SURGERY Bilateral    CESAREAN SECTION     X 1   COLONOSCOPY     CORONARY STENT INTERVENTION N/A 12/18/2019   Procedure: CORONARY STENT INTERVENTION;  Surgeon: Jettie Booze, MD;  Location: [facility];  Service: Cardiovascular;  Laterality: N/A;   enlarged bladder as a child     HAD SURGERY TO ENLARGE BLADDER   INTRAVASCULAR PRESSURE WIRE/FFR STUDY N/A 12/18/2019   Procedure: INTRAVASCULAR PRESSURE WIRE/FFR STUDY;  Surgeon: Jettie Booze, MD;  Location: [facility];  Service: Cardiovascular;  Laterality: N/A;   RIGHT/LEFT HEART CATH AND CORONARY ANGIOGRAPHY N/A 12/18/2019   Procedure: RIGHT/LEFT HEART CATH AND CORONARY ANGIOGRAPHY;  Surgeon: Jettie Booze, MD;  Location: [facility];  Service: Cardiovascular;  Laterality:  N/A;   TONSILLECTOMY     TOTAL HIP ARTHROPLASTY Right 04/14/2013   Procedure: TOTAL HIP ARTHROPLASTY ANTERIOR APPROACH;  Surgeon: Hessie Dibble, MD;  Location: [facility];  Service: Orthopedics;  Laterality: Right;    Current Medications: Current Meds  Medication Sig   ALPRAZolam (XANAX) 0.25 MG tablet Take 0.25 mg by mouth daily.   aspirin EC 81 MG tablet Take 1 tablet (81 mg total) by mouth daily. Swallow whole.   atorvastatin (LIPITOR) 40 MG tablet Take 40 mg by mouth daily.   escitalopram (LEXAPRO) 20 MG tablet Take 20 mg by mouth daily.   Magnesium Oxide 250 MG TABS Take 250 mg by mouth at  bedtime.   nitroGLYCERIN (NITROSTAT) 0.4 MG SL tablet PLACE 1 TABLET (0.4 MG TOTAL) UNDER THE TONGUE EVERY FIVE MINUTES AS NEEDED FOR CHEST Roy.   Current Facility-Administered Medications for the 11/04/20 encounter (Office Visit) with Traci Pain, MD  Medication   sacubitril-valsartan (ENTRESTO) 24-26 mg per tablet   sacubitril-valsartan (ENTRESTO) 24-26 mg per tablet     Allergies:   Lisinopril and Adhesive [tape]   Social History   Socioeconomic History   Marital status: Married    Spouse name: Not on file   Number of children: Not on file   Years of education: Not on file   Highest education level: Not on file  Occupational History   Not on file  Tobacco Use   Smoking status: Never   Smokeless tobacco: Never  Substance and Sexual Activity   Alcohol use: Yes    Comment: glass of wine every now and then   Drug use: No   Sexual activity: Yes    Birth control/protection: Other-see comments  Other Topics Concern   Not on file  Social History Narrative   Not on file   Social Determinants of Health   Financial Resource Strain: Not on file  Food Insecurity: Not on file  Transportation Needs: Not on file  Physical Activity: Not on file  Stress: Not on file  Social Connections: Not on file     Family History: The patient's Family history is unknown by patient.  ROS:   Please see the history of present illness.    (+) Fatigue (+) Insomnia All other systems reviewed and are negative.  EKGs/Labs/Other Studies Reviewed:    The following studies were reviewed today:  EKG:   EKG is personally reviewed and interpreted. 11/04/2020: Sinus . Rate 74 bpm.  Echo 10/28/2020: 1. Left ventricular ejection fraction, by estimation, is 40 to 45%. The  left ventricle has mildly decreased function. The left ventricle  demonstrates global hypokinesis. The left ventricular internal cavity size  was mildly dilated. Left ventricular  diastolic parameters are consistent with Grade  I diastolic dysfunction  (impaired relaxation).   2. Right ventricular systolic function is normal. The right ventricular  size is normal.   3. The mitral valve is normal in structure. Mild mitral valve  regurgitation. No evidence of mitral stenosis.   4. The aortic valve has an indeterminant number of cusps. Aortic valve  regurgitation is not visualized. No aortic stenosis is present.   5. The inferior vena cava is normal in size with greater than 50%  respiratory variability, suggesting right atrial pressure of 3 mmHg.   Comparison(s): 12/02/2019 40-45%.   Cath 12/18/19: 2nd Diag lesion is 50% stenosed. Mid LAD lesion is 70% stenosed. Significant by DFR. A drug-eluting stent was successfully placed using a STENT RESOLUTE ONYX 3.0X26, postdilated to  3.75. Post intervention, there is a 0% residual stenosis. The left ventricular ejection fraction is 50-55% by visual estimate. LV end diastolic pressure is normal. The left ventricular systolic function is normal. There is no aortic valve stenosis. Ao sat 97%, PA sat 72%, PA pressure 35/14, mean PA pressure 24 mm Hg, mean PCWP 11 mm Hg; CO 6.3 L/min; CI 3.01 Normal right heart pressures. EBU 3.5 would have been a better choice of guide catheter.   Continue dual antiplatelet therapy along with aggressive secondary prevention.     Plan for same day discharge.  Diagnostic Dominance: Right  Intervention      ECHO 12/02/19:  1. Left ventricular ejection fraction, by estimation, is 40 to 45%. The  left ventricle has mildly decreased function. The left ventricle  demonstrates global hypokinesis. The left ventricular internal cavity size  was mildly dilated. Left ventricular  diastolic parameters were normal.   2. Right ventricular systolic function is normal. The right ventricular  size is normal. There is normal pulmonary artery systolic pressure.   3. Left atrial size was mildly dilated.   4. The mitral valve is normal in  structure. No evidence of mitral valve  regurgitation. No evidence of mitral stenosis.   5. The aortic valve is tricuspid. Aortic valve regurgitation is not  visualized. Mild aortic valve sclerosis is present, with no evidence of  aortic valve stenosis.   6. The inferior vena cava is normal in size with greater than 50%  respiratory variability, suggesting right atrial pressure of 3 mmHg.    Recent Labs: 12/14/2019: Platelets 238 12/18/2019: Hemoglobin 12.2 10/31/2020: BUN 17; Creatinine, Ser 1.02; Potassium 4.0; Sodium 145   Recent Lipid Panel No results found for: CHOL, TRIG, HDL, CHOLHDL, VLDL, LDLCALC, LDLDIRECT   Risk Assessment/Calculations:      Physical Exam:    VS:  BP 120/80 (BP Location: Left Arm, Patient Position: Sitting, Cuff Size: Normal)   Pulse 74   Ht 5\' 4"  (1.626 m)   Wt 249 lb (112.9 kg)   LMP 02/05/2014   SpO2 98%   BMI 42.74 kg/m     Wt Readings from Last 3 Encounters:  11/04/20 249 lb (112.9 kg)  05/06/20 242 lb (109.8 kg)  12/30/19 239 lb (108.4 kg)     GEN: Well nourished, well developed in no acute distress HEENT: Normal NECK: No JVD; No carotid bruits LYMPHATICS: No lymphadenopathy CARDIAC: RRR, no murmurs, rubs, gallops RESPIRATORY:  Clear to auscultation without rales, wheezing or rhonchi  ABDOMEN: Soft, non-tender, non-distended MUSCULOSKELETAL:  No edema; No deformity  SKIN: Warm and dry NEUROLOGIC:  Alert and oriented x 3 PSYCHIATRIC:  Normal affect    ASSESSMENT:    1. Essential hypertension   2. Coronary artery disease involving native coronary artery of native heart without angina pectoris   3. Left ventricular dysfunction   4. Pure hypercholesterolemia     PLAN:    In order of problems listed above: CAD (coronary artery disease) Previously placed LAD stent in December 18, 2019.  She is on aspirin 81 mg monotherapy at this point.  She requested to come off of medications previously.  Cardiac catheterization personally  reviewed with her.  Overall no chest discomfort.  Her repeat echocardiogram showed ejection fraction of 45%.  This is congruous with prior echocardiogram.  Overall mildly reduced.  Reassurance has been given.  Continue with goal-directed medical therapy which included new start Entresto.  She was concerned about this.  She read how it could cause  fatigue and renal failure.  Explained this to her at length.  Continue to monitor her renal function.  Stopped her HCTZ.  Left ventricular dysfunction Mildly reduced ejection fraction noted on echocardiogram.  Had been previously on losartan, angiotensin receptor blocker.  EF 45% on repeat echocardiogram once again.  We have decided to change her over to [facility] for goal-directed medical therapy.  Explained methodology.  Continue to monitor lab work.  Hyperlipidemia Continue with atorvastatin 40 mg once a day.   Follow-up: 6 months    Medication Adjustments/Labs and Tests Ordered: Current medicines are reviewed at length with the patient today.  Concerns regarding medicines are outlined above.   Orders Placed This Encounter  Procedures   EKG 12-Lead    No orders of the defined types were placed in this encounter.  Patient Instructions  Medication Instructions:  The current medical regimen is effective;  continue present plan and medications.  *If you need a refill on your cardiac medications before your next appointment, please call your pharmacy*  Follow-Up: At [facility], you and your health needs are our priority.  As part of our continuing mission to provide you with exceptional heart care, we have created designated Provider Care Teams.  These Care Teams include your primary Cardiologist (physician) and Advanced Practice Providers (APPs -  Physician Assistants and Nurse Practitioners) who all work together to provide you with the care you need, when you need it.  We recommend signing up for the patient portal called "MyChart".  Sign  up information is provided on this After Visit Summary.  MyChart is used to connect with patients for Virtual Visits (Telemedicine).  Patients are able to view lab/test results, encounter notes, upcoming appointments, etc.  Non-urgent messages can be sent to your provider as well.   To learn more about what you can do with MyChart, go to [email or web address].    Your next appointment:   6 month(s)  The format for your next appointment:   In Person  Provider:   Candee Furbish, MD   Thank you for choosing [facility]!!     I,Mathew Stumpf,acting as a scribe for Candee Furbish, MD.,have documented all relevant documentation on the behalf of Candee Furbish, MD,as directed by  Candee Furbish, MD while in the presence of Candee Furbish, MD.  I, Candee Furbish, MD, have reviewed all documentation for this visit. The documentation on 11/06/20 for the exam, diagnosis, procedures, and orders are all accurate and complete.   Signed, Candee Furbish, MD  11/06/2020 2:09 PM    [place]

## 2020-11-04 NOTE — Patient Instructions (Signed)

## 2020-11-06 DIAGNOSIS — I519 Heart disease, unspecified: Secondary | ICD-10-CM | POA: Insufficient documentation

## 2020-11-06 DIAGNOSIS — E785 Hyperlipidemia, unspecified: Secondary | ICD-10-CM | POA: Insufficient documentation

## 2020-11-06 DIAGNOSIS — I2510 Atherosclerotic heart disease of native coronary artery without angina pectoris: Secondary | ICD-10-CM | POA: Insufficient documentation

## 2020-11-06 NOTE — Assessment & Plan Note (Signed)
Mildly reduced ejection fraction noted on echocardiogram.  Had been previously on losartan, angiotensin receptor blocker.  EF 45% on repeat echocardiogram once again.  We have decided to change her over to [facility] for goal-directed medical therapy.  Explained methodology.  Continue to monitor lab work.

## 2020-11-06 NOTE — Assessment & Plan Note (Signed)
Previously placed LAD stent in December 18, 2019.  She is on aspirin 81 mg monotherapy at this point.  She requested to come off of medications previously.  Cardiac catheterization personally reviewed with her.  Overall no chest discomfort.  Her repeat echocardiogram showed ejection fraction of 45%.  This is congruous with prior echocardiogram.  Overall mildly reduced.  Reassurance has been given.  Continue with goal-directed medical therapy which included new start Entresto.  She was concerned about this.  She read how it could cause fatigue and renal failure.  Explained this to her at length.  Continue to monitor her renal function.  Stopped her HCTZ.

## 2020-11-06 NOTE — Assessment & Plan Note (Signed)
Continue with atorvastatin 40 mg once a day.

## 2020-11-11 DIAGNOSIS — E1169 Type 2 diabetes mellitus with other specified complication: Secondary | ICD-10-CM | POA: Diagnosis not present

## 2020-11-11 DIAGNOSIS — R1030 Lower abdominal pain, unspecified: Secondary | ICD-10-CM | POA: Diagnosis not present

## 2020-11-11 DIAGNOSIS — N3000 Acute cystitis without hematuria: Secondary | ICD-10-CM | POA: Diagnosis not present

## 2020-11-11 DIAGNOSIS — I10 Essential (primary) hypertension: Secondary | ICD-10-CM | POA: Diagnosis not present

## 2020-11-18 ENCOUNTER — Telehealth: Payer: Self-pay | Admitting: Cardiology

## 2020-11-18 NOTE — Telephone Encounter (Signed)
Called patient and got her set up for repeat labs, and pharmacist appt per staff message. Patient wanted to know why she needed to follow up in 2-3 months. I was not aware of why and told her Traci Roy wanted her to follow up. Please advise why appt is needed.

## 2020-11-18 NOTE — Telephone Encounter (Signed)
Spoke with pt and advised per documentation by Cecilie Kicks per was to start [place], f/u with blood work (BMP) in 2 weeks and f/u with her or the pharmacy team in 2 to 3 months for possible medication titration.  Pt states understanding.  She has been taking the medication as ordered and is scheduled for f/u (both lab and pharmacy team) 10/26.  She had no further questions or concerns at the end of the call.

## 2020-11-28 ENCOUNTER — Telehealth: Payer: Self-pay | Admitting: Cardiology

## 2020-11-28 NOTE — Telephone Encounter (Signed)
Pt c/o medication issue:  1. Name of Medication: sacubitril-valsartan (ENTRESTO) 24-26 mg per tablet  2. How are you currently taking this medication (dosage and times per day)? As directed  3. Are you having a reaction (difficulty breathing--STAT)? Possible stomach ulcer. The patient said she was not having symptoms of stomach ulcer before starting this medicine   4. What is your medication issue? Patient can not afford the medication. She would like to know if the office could send an rx for a generic version to [id number] - [place], [place] - [address]

## 2020-11-28 NOTE — Telephone Encounter (Signed)
Not sure if pt qualifies for assistance Per pt cannot afford $75.00 a month Will forward to [place] [facility] LPN ./cy

## 2020-11-29 DIAGNOSIS — R197 Diarrhea, unspecified: Secondary | ICD-10-CM | POA: Diagnosis not present

## 2020-11-29 DIAGNOSIS — E1169 Type 2 diabetes mellitus with other specified complication: Secondary | ICD-10-CM | POA: Diagnosis not present

## 2020-11-29 MED ORDER — SACUBITRIL-VALSARTAN 24-26 MG PO TABS: 1.0000 | ORAL_TABLET | Freq: Two times a day (BID) | ORAL | 3 refills | Status: DC

## 2020-11-29 NOTE — Telephone Encounter (Signed)
**Note De-Identified Traci Roy Obfuscation** Per the pt her Entresto 24-26 mg [facility].  She asked me to hold on and the call ended. I called her back but her number was busy.  [facility] and they confirmed that the pts Entresto 24-26 mg RX was called in by Cecilie Kicks, NP. I have added Entresto 24-26 mg to the pts med list.  They also confirmed that the pts co-pay for Delene Loll is $75/30 day supply and that a PA is not required.  I called the pt and was able to reach her. She states that a nurse advised her yesterday that Delene Loll does not have a generic form available so she has no other questions about cost at this time.  She does have concerns/questions about all the medications she is taking. She states that she has developed stomach ulcers and wants to come off as many meds as she safely can.  She wants to know if it is possible for her to manage her heart issues without having to take medications.  She has an appointment scheduled with PharmD tomorrow at 3:30 for medication management. I have advised her to write her concerns down and to discuss them with the pharmacist at her office visit with them tomorrow.  The pt had concerns that she may have to cancel tomorrows appointment due to having diarrhea and being contagious but I advised her that if she has no covid symptoms that she can keep her appointment.  I will forward this message to PharmD as Juluis Rainier.

## 2020-11-29 NOTE — Telephone Encounter (Signed)
**Note De-Identified Traci Roy Obfuscation** Per Dr Marlou Porch at the pts 11/04/2020 office visit:  Left ventricular dysfunction Mildly reduced ejection fraction noted on echocardiogram.  Had been previously on losartan, angiotensin receptor blocker.  EF 45% on repeat echocardiogram once again.  We have decided to change her over to [facility] for goal-directed medical therapy.  Explained methodology.  Continue to monitor lab work.  What is the pts Entresto dose as it is not listed in the pts current med list.  Will forward to Dr Marlou Porch for advisement.

## 2020-11-29 NOTE — Progress Notes (Unsigned)
Patient ID: [name]                 DOB: [birth year]                      MRN: [id number]     HPI: Traci Roy is a 60 y.o. female referred by Dr. Marland Kitchen to pharmacy clinic for HF medication management. PMH is significant for  CAD (with stent to LAD 12/2019)***. Most recent LVEF 45% on 10/2020.  Today she returns to pharmacy clinic for further medication titration. At last visit with MD ***. Symptomatically, she is feeling ***, *** dizziness, lightheadedness, and fatigue. *** chest pain or palpitations. Feels SOB when ***. Able to complete all ADLs. Activity level ***. She *** checks her weight at home (normal range *** - *** lbs). *** LEE, PND, or orthopnea. Appetite has been ***. She *** adheres to a low-salt diet.   Patient was last seen by Dr. Marlou Roy on 9/30. At that visit her ECHO was reviewed with her, losartan and HCTZ were stopped, and patient was started on Entresto 24-26 mg BID. More recently on 10/14, during a trip to [place], she reported difficulty walking requiring use of a wheelchair, increeased SOB, and chest pressure. Dr. Marlou Roy advised patient that some SOB is fine and encouraged patient to push herself when it comes to activity and to follow-up at PharmD appointment.   10/25 - phone note  Phone note from 10/25, pt reports having developed stomach ulcers and would like to come off HF medications as safely as possible.  --------------------------------------- Thoughts:  - she has developed stomach ulcers - wants to come off of HF medications as safely as possible - coming in with questions - cost of entresto is $75/30-day supply - copay card? - [employer] + Engineer, maintenance (IT) -repeat Bmet per Traci Roy since being on [address]?  Was started on 9/24 --> repeat BMEt on 10/7 - says increased fatigue and low energy since being on Entresto - why was HCTZ stopped? - (Baseline Scr 0.9-1.0 K 4.0-4.2) - 10/7 - second set since being on Entresto Goals: -assess diet and  exercise - repeat BMet  Interventions: - mr EF - SGLT2i, spiro, ARNI - shes not gonna wanna pay for another brand name - but she has Pharmacist, community? - she wants to come off medications - better data with an SGLT2i - BP is controlled  ----------------------------  Current CHF meds: Entresto 24-26 mg BID,  Previously tried: metoprolol succinate 25 mg daily, losartan 50 mg daily, hydrochlorothiazide 25 mg daily BP goal: <130/80  Family History: Family history is unknown by patient.  Social History:  reports that she has never smoked. She has never used smokeless tobacco. She reports current alcohol use. She reports that she does not use drugs.  Diet:   Exercise:   Home BP readings:   Wt Readings from Last 3 Encounters:  11/04/20 249 lb (112.9 kg)  05/06/20 242 lb (109.8 kg)  12/30/19 239 lb (108.4 kg)   BP Readings from Last 3 Encounters:  11/04/20 120/80  05/06/20 120/70  12/30/19 110/70   Pulse Readings from Last 3 Encounters:  11/04/20 74  05/06/20 70  12/30/19 70    Renal function: CrCl cannot be calculated (Patient's most recent lab result is older than the maximum 21 days allowed.).  Past Medical History:  Diagnosis Date   Arthritis    Depression    takes Lexapro daily   Dizziness  occasionally and no meds   Flu    end of Dec 2014   GERD (gastroesophageal reflux disease)    occasionally and no meds required   History of bronchitis    last time many yrs ago   History of duodenal ulcer    History of shingles 3-5 YRS AGO   Hypertension    takes Losartan and HCTZ daily   Joint pain    Restless legs     Current Outpatient Medications on File Prior to Visit  Medication Sig Dispense Refill   ALPRAZolam (XANAX) 0.25 MG tablet Take 0.25 mg by mouth daily.     aspirin EC 81 MG tablet Take 1 tablet (81 mg total) by mouth daily. Swallow whole. 90 tablet 3   atorvastatin (LIPITOR) 40 MG tablet Take 40 mg by mouth daily.     escitalopram (LEXAPRO) 20  MG tablet Take 20 mg by mouth daily.     Magnesium Oxide 250 MG TABS Take 250 mg by mouth at bedtime.     nitroGLYCERIN (NITROSTAT) 0.4 MG SL tablet PLACE 1 TABLET (0.4 MG TOTAL) UNDER THE TONGUE EVERY FIVE MINUTES AS NEEDED FOR CHEST PAIN. 25 tablet 0   sacubitril-valsartan (ENTRESTO) 24-26 MG Take 1 tablet by mouth 2 (two) times daily. 60 tablet 3   No current facility-administered medications on file prior to visit.    Allergies  Allergen Reactions   Lisinopril Cough   Adhesive [Tape] Itching and Rash    Itching and breaks out     Assessment/Plan:  1. CHF -   Traci Roy  Pharm D. [place]

## 2020-11-30 ENCOUNTER — Other Ambulatory Visit: Payer: BC Managed Care – PPO

## 2020-11-30 ENCOUNTER — Ambulatory Visit: Payer: BC Managed Care – PPO

## 2020-12-09 DIAGNOSIS — R197 Diarrhea, unspecified: Secondary | ICD-10-CM | POA: Diagnosis not present

## 2020-12-09 DIAGNOSIS — M5431 Sciatica, right side: Secondary | ICD-10-CM | POA: Diagnosis not present

## 2020-12-16 DIAGNOSIS — R197 Diarrhea, unspecified: Secondary | ICD-10-CM | POA: Diagnosis not present

## 2020-12-21 DIAGNOSIS — F419 Anxiety disorder, unspecified: Secondary | ICD-10-CM | POA: Diagnosis not present

## 2020-12-21 DIAGNOSIS — K529 Noninfective gastroenteritis and colitis, unspecified: Secondary | ICD-10-CM | POA: Diagnosis not present

## 2020-12-21 DIAGNOSIS — E119 Type 2 diabetes mellitus without complications: Secondary | ICD-10-CM | POA: Diagnosis not present

## 2021-01-14 ENCOUNTER — Encounter: Payer: Self-pay | Admitting: Cardiology

## 2021-02-17 DIAGNOSIS — E119 Type 2 diabetes mellitus without complications: Secondary | ICD-10-CM | POA: Diagnosis not present

## 2021-02-19 ENCOUNTER — Encounter: Payer: Self-pay | Admitting: Cardiology

## 2021-02-20 NOTE — Telephone Encounter (Signed)
Left message to call back  

## 2021-02-21 ENCOUNTER — Telehealth: Payer: Self-pay | Admitting: Cardiology

## 2021-02-21 NOTE — Telephone Encounter (Signed)
Teleah from [place] at [facility], Dr. Cheron Schaumann' office called.  He wants to start patient on either ozempic or mounjaro, as patient's A1C was 6.8.  He wants to know which medication Dr. Marlou Porch is in agreement with.

## 2021-02-22 NOTE — Telephone Encounter (Signed)
Spoke with Teleah with [facility] and advised per Dr Marlou Porch pt may start either Ozempic or Monjaro.  Teleah verbalizes understanding and thanked Therapist, sports for the call.

## 2021-02-22 NOTE — Telephone Encounter (Signed)
I would be fine starting either one. Thanks

## 2021-03-03 DIAGNOSIS — F4321 Adjustment disorder with depressed mood: Secondary | ICD-10-CM | POA: Diagnosis not present

## 2021-03-07 ENCOUNTER — Ambulatory Visit: Payer: BC Managed Care – PPO | Admitting: Pharmacist

## 2021-03-07 ENCOUNTER — Other Ambulatory Visit: Payer: Self-pay

## 2021-03-07 VITALS — BP 112/82 | HR 76 | Wt 250.0 lb

## 2021-03-07 DIAGNOSIS — I519 Heart disease, unspecified: Secondary | ICD-10-CM | POA: Diagnosis not present

## 2021-03-07 DIAGNOSIS — I2510 Atherosclerotic heart disease of native coronary artery without angina pectoris: Secondary | ICD-10-CM

## 2021-03-07 DIAGNOSIS — E7800 Pure hypercholesterolemia, unspecified: Secondary | ICD-10-CM | POA: Diagnosis not present

## 2021-03-07 NOTE — Progress Notes (Signed)
Patient ID: Traci Roy                 DOB: Aug 12, 1960                      MRN: [id number]     HPI: Traci Roy is a 61 y.o. female referred by Dr. Marlou Porch to pharmacy clinic for HF medication management. PMH is significant for CAD status post stent placement to the LAD on 12/18/2019. Ejection fraction at that time 40 to 45%.  Prior hospitalization with COVID in September 2021.  She was previously having continued shortness of breath as well as chest discomfort. Hx of chronic fatigue and anxiety. Most recent LVEF 40-45% on 10/28/20. Losartan was changed to [facility] in September. HCTZ was stopped. She was supposed to see PharmD in Oct, but apt was canceled due to pt being sick and was never rescheduled.  Patient presents to PharmD appointment. She expresses concern as to why she is meeting with the pharmacist today. She had concerns over cost of Entresto, but someone told her about the copay card so now she is only paying $10. She has blood work at PCP on 10/7 that was stable, but unsure how long she actually was on Entresto prior to that blood work. Wants to know how we know the Delene Loll is working. Doesn't like being on medications. Feels tired all the time. Doesn't know if its from the [place] or not. She admits to a few seconds of dizziness occasionally. No SOB or swelling. Has not been checking BP at home, but does have a cuff. Was recently dx with DM (A1C 6.8). PCP wants to start her on an injection. Just recently has been trying to improve her diet. Has always had a really hard time loosing weight. Is trying to keep carbs low (200-300 carbs per day) and do intermittent fasting per patient. Although she admits she doesn't really understand what intermittent fasting is.  States she was on GLP-1 in past but never lost any weight. Has a slipped disk in back and numbness in her feet. Feet hurt all the time. This limits her physical activity. Patient questioned why she still needed atorvastatin if  she has a stent.   Current CHF meds: Entresto 24-26mg  twice a day Previously tried: losartan, HCTZ- switched to Entresto BP goal: <130/80  Family History:  Family History  Family history unknown: Yes     Social History:  Social History   Socioeconomic History   Marital status: Married    Spouse name: Not on file   Number of children: Not on file   Years of education: Not on file   Highest education level: Not on file  Occupational History   Not on file  Tobacco Use   Smoking status: Never   Smokeless tobacco: Never  Substance and Sexual Activity   Alcohol use: Yes    Comment: glass of wine every now and then   Drug use: No   Sexual activity: Yes    Birth control/protection: Other-see comments  Other Topics Concern   Not on file  Social History Narrative   Not on file   Social Determinants of Health   Financial Resource Strain: Not on file  Food Insecurity: Not on file  Transportation Needs: Not on file  Physical Activity: Not on file  Stress: Not on file  Social Connections: Not on file  Intimate Partner Violence: Not on file     Diet: scrambled eggs, cheese,  OJ, coffee Pack of nuts Strawberry yogurt 8oz milk, swiss cheese and crackers cookie  Exercise: limited by back pain and numbness in feet  Home BP readings: none  Wt Readings from Last 3 Encounters:  11/04/20 249 lb (112.9 kg)  05/06/20 242 lb (109.8 kg)  12/30/19 239 lb (108.4 kg)   BP Readings from Last 3 Encounters:  11/04/20 120/80  05/06/20 120/70  12/30/19 110/70   Pulse Readings from Last 3 Encounters:  11/04/20 74  05/06/20 70  12/30/19 70    Renal function: CrCl cannot be calculated (Patient's most recent lab result is older than the maximum 21 days allowed.).  Past Medical History:  Diagnosis Date   Arthritis    Depression    takes Lexapro daily   Dizziness    occasionally and no meds   Flu    end of Dec 2014   GERD (gastroesophageal reflux disease)    occasionally  and no meds required   History of bronchitis    last time many yrs ago   History of duodenal ulcer    History of shingles 3-5 YRS AGO   Hypertension    takes Losartan and HCTZ daily   Joint pain    Restless legs     Current Outpatient Medications on File Prior to Visit  Medication Sig Dispense Refill   ALPRAZolam (XANAX) 0.25 MG tablet Take 0.25 mg by mouth daily.     aspirin EC 81 MG tablet Take 1 tablet (81 mg total) by mouth daily. Swallow whole. 90 tablet 3   atorvastatin (LIPITOR) 40 MG tablet Take 40 mg by mouth daily.     escitalopram (LEXAPRO) 20 MG tablet Take 20 mg by mouth daily.     Magnesium Oxide 250 MG TABS Take 250 mg by mouth at bedtime.     nitroGLYCERIN (NITROSTAT) 0.4 MG SL tablet PLACE 1 TABLET (0.4 MG TOTAL) UNDER THE TONGUE EVERY FIVE MINUTES AS NEEDED FOR CHEST PAIN. 25 tablet 0   sacubitril-valsartan (ENTRESTO) 24-26 MG Take 1 tablet by mouth 2 (two) times daily. 60 tablet 3   No current facility-administered medications on file prior to visit.    Allergies  Allergen Reactions   Lisinopril Cough   Adhesive [Tape] Itching and Rash    Itching and breaks out     Assessment/Plan:  1. CHF -  I discussed the pathophysiology of HF, how the medications work, the data we have for HFmrEF and the medications with the best evidence in this population. We discussed SGLT2 and its double benefits of DM and HF. Explained that this would be in addition to [place]. Offered to switch Delene Loll out for another ARB if she really thinks its contributing to her fatigue. Patient was then just upset that she would still have to be on 2 medications for HF. My only concern about stopping ACE/ARB/ARNI would be that she was on losartan and HCTZ for BP and her BP may be elevated if we stopped. We also talked about GLP-1 and its cardiovascular benefit/weight loss/DM control. Advised that it was also a very good option. In an ideal world we would use both, but patient was very resistant to  multiple medications.  We talked about diet and weight loss. I explained with intermittent fasting was. I tried to provide her some great resources for her to understand what good nutrition was (especially [place] podcast which is very patient friendly) but patient states "Im going to be honest, I'm not going to read a book  or listen to a podcast. Im not going to count anything either." I did explain to patient that there is no easy way out. You cant just take a pill and be cured. She asked about the affects of her weight on her CAD, CHF and DM. I explained that there certainly is a link and that weight loss could help with all of those things.  Will get a BMP today since last one is from Oct. I have asked patient to check her BP at home which she is agreeable to. I have asked her to send me readings in 1-2 weeks. No medication changes today as patient resistant. Could consider increasing Entresto if home BP is high (pretty well controlled in office today). I did explain that I thought the addition of a SGLT2 would be the best option for her.  2. CAD- I explained how statins work to decrease risk of a CV event and the importance of continuing.  3. DM- Patient did not want to make any changes until she tried diet first. See CHF for more discussion about SGLT2 and GLP-1.  Thank you,  Ramond Dial, Pharm.D, BCPS, CPP [facility]  [address], [place], [place] [address]  Phone: [phone]; Fax: [phone]

## 2021-03-07 NOTE — Patient Instructions (Signed)
Please start checking blood pressure once a day. Please send me a list of readings in 1-2 weeks via mychart  Please call me at [phone] if you have any questions  Good resources:  Eat. Sleep. Move. Breath: The Beginners Guide to Living A Healthy Lifestyle  Eat, Drink, and Be Healthy: The [employer] Guide to [employer] (2007 version)  Zoe podcast  Tips for living a healthier life     Building a Healthy and Balanced Diet Make most of your meal vegetables and fruits -  of your plate. Aim for color and variety, and remember that potatoes dont count as vegetables on the Healthy Eating Plate because of their negative impact on blood sugar.  Go for whole grains -  of your plate. Whole and intact grains--whole wheat, barley, wheat berries, quinoa, oats, brown rice, and foods made with them, such as whole wheat pasta--have a milder effect on blood sugar and insulin than white bread, white rice, and other refined grains.  Protein power -  of your plate. Fish, poultry, beans, and nuts are all healthy, versatile protein sources--they can be mixed into salads, and pair well with vegetables on a plate. Limit red meat, and avoid processed meats such as bacon and sausage.  Healthy plant oils - in moderation. Choose healthy vegetable oils like olive, canola, soy, corn, sunflower, peanut, and others, and avoid partially hydrogenated oils, which contain unhealthy trans fats. Remember that low-fat does not mean healthy.  Drink water, coffee, or tea. Skip sugary drinks, limit milk and dairy products to one to two servings per day, and limit juice to a small glass per day.  Stay active. The red figure running across the [place] is a reminder that staying active is also important in weight control.  The main message of the Healthy Eating Plate is to focus on diet quality:  The type of carbohydrate in the diet is more important than the amount of  carbohydrate in the diet, because some sources of carbohydrate--like vegetables (other than potatoes), fruits, whole grains, and beans--are healthier than others. The Healthy Eating Plate also advises consumers to avoid sugary beverages, a major source of calories--usually with little nutritional value--in the American diet. The Healthy Eating Plate encourages consumers to use healthy oils, and it does not set a maximum on the percentage of calories people should get each day from healthy sources of fat. In this way, the Healthy Eating Plate recommends the opposite of the low-fat message promoted for decades by the USDA.  [employer].no  SUGAR  Sugar is a huge problem in the modern day diet. Sugar is a big contributor to heart disease, diabetes, high triglyceride levels, fatty liver disease and obesity. Sugar is hidden in almost all packaged foods/beverages. Added sugar is extra sugar that is added beyond what is naturally found and has no nutritional benefit for your body. The American Heart Association recommends limiting added sugars to no more than 25g for women and 36 grams for men per day. There are many names for sugar including maltose, sucrose (names ending in "ose"), high fructose corn syrup, molasses, cane sugar, corn sweetener, raw sugar, syrup, honey or fruit juice concentrate.   One of the best ways to limit your added sugars is to stop drinking sweetened beverages such as soda, sweet tea, and fruit juice.  There is 65g of added sugars in one 20oz bottle of Coke! That is equal to 7.5 donuts.   Pay attention and read all nutrition  facts labels. Below is an examples of a nutrition facts label. The #1 is showing you the total sugars where the # 2 is showing you the added sugars. This one serving has almost the max amount of added sugars per day!     20 oz Soda 65g Sugar = 7.5 Glazed Donuts  16oz Energy  Drink 54g Sugar = 6.5  Glazed Donuts  Large Sweet  Tea 38g Sugar = 4 Glazed Donuts  20oz Sports  Drink 34g Sugar = 3.5 Glazed Donuts  8oz Chocolate Milk 24g Sugar =2.5 Glazed Donuts  8oz Orange  Juice 21g Sugar = 2 Glazed Donuts  1 Juice Box 14g Sugar = 1.5 Glazed Donuts  16oz Water= NO SUGAR!!  EXERCISE  Exercise is good. Weve all heard that. In an ideal world, we would all have time and resources to get plenty of it. When you are active, your heart pumps more efficiently and you will feel better.  Multiple studies show that even walking regularly has benefits that include living a longer life. The American Heart Association recommends 150 minutes per week of exercise (30 minutes per day most days of the week). You can do this in any increment you wish. Nine or more 10-minute walks count. So does an hour-long exercise class. Break the time apart into what will work in your life. Some of the best things you can do include walking briskly, jogging, cycling or swimming laps. Not everyone is ready to exercise. Sometimes we need to start with just getting active. Here are some easy ways to be more active throughout the day:  Take the stairs instead of the elevator  Go for a 10-15 minute walk during your lunch break (find a friend to make it more enjoyable)  When shopping, park at the back of the parking lot  If you take public transportation, get off one stop early and walk the extra distance  Pace around while making phone calls  Check with your doctor if you arent sure what your limitations may be. Always remember to drink plenty of water when doing any type of exercise. Dont feel like a failure if youre not getting the 90-150 minutes per week. If you started by being a couch potato, then just a 10-minute walk each day is a huge improvement. Start with little victories and work your way up.   HEALTHY EATING TIPS  When looking to improve your eating habits, whether to lose weight, lower blood  pressure or just be healthier, it helps to know what a serving size is.   Grains 1 slice of bread,  bagel,  cup pasta or rice  Vegetables 1 cup fresh or raw vegetables,  cup cooked or canned Fruits 1 piece of medium sized fruit,  cup canned,   Meats/Proteins  cup dried       1 oz meat, 1 egg,  cup cooked beans, nuts or seeds  Dairy        Fats Individual yogurt container, 1 cup (8oz)    1 teaspoon margarine/butter or vegetable  milk or milk alternative, 1 slice of cheese          oil; 1 tablespoon mayonnaise or salad dressing                  Plan ahead: make a menu of the meals for a week then create a grocery list to go with that menu. Consider meals that easily stretch into a night of leftovers, such as stews  or casseroles. Or consider making two of your favorite meal and put one in the freezer for another night. Try a night or two each week that is meatless or no cook such as salads. When you get home from the grocery store wash and prepare your vegetables and fruits. Then when you need them they are ready to go.   Tips for going to the grocery store:  Beach store or generic brands  Check the weekly ad from your store on-line or in their in-store flyer  Look at the unit price on the shelf tag to compare/contrast the costs of different items  Buy fruits/vegetables in season  Carrots, bananas and apples are low-cost, naturally healthy items  If meats or frozen vegetables are on sale, buy some extras and put in your freezer  Limit buying prepared or ready to eat items, even if they are pre-made salads or fruit snacks  Do not shop when youre hungry  Foods at eye level tend to be more expensive. Look on the high and low shelves for deals.  Consider shopping at the farmers market for fresh foods in season.  Avoid the cookie and chip aisles (these are expensive, high in calories and low in nutritional value). Shop on the outside of the grocery store.  Healthy food preparations:   If you cant get lean hamburger, be sure to drain the fat when cooking  Steam, saut (in olive oil), grill or bake foods  Experiment with different seasonings to avoid adding salt to your foods. Kosher salt, sea salt and Himalayan salt are all still salt and should be avoided. Try seasoning food with onion, garlic, thyme, rosemary, basil ect. Onion powder or garlic powder is ok. Avoid if it says salt (ie garlic salt).

## 2021-03-08 LAB — BASIC METABOLIC PANEL
BUN/Creatinine Ratio: 21 (ref 12–28)
BUN: 20 mg/dL (ref 8–27)
CO2: 25 mmol/L (ref 20–29)
Calcium: 9.6 mg/dL (ref 8.7–10.3)
Chloride: 103 mmol/L (ref 96–106)
Creatinine, Ser: 0.94 mg/dL (ref 0.57–1.00)
Glucose: 129 mg/dL — ABNORMAL HIGH (ref 70–99)
Potassium: 4.3 mmol/L (ref 3.5–5.2)
Sodium: 143 mmol/L (ref 134–144)
eGFR: 69 mL/min/{1.73_m2} (ref >59)

## 2021-03-10 ENCOUNTER — Encounter: Payer: Self-pay | Admitting: Cardiology

## 2021-03-13 DIAGNOSIS — E1169 Type 2 diabetes mellitus with other specified complication: Secondary | ICD-10-CM | POA: Diagnosis not present

## 2021-03-17 DIAGNOSIS — F4321 Adjustment disorder with depressed mood: Secondary | ICD-10-CM | POA: Diagnosis not present

## 2021-03-23 DIAGNOSIS — R079 Chest pain, unspecified: Secondary | ICD-10-CM | POA: Diagnosis not present

## 2021-03-23 DIAGNOSIS — M722 Plantar fascial fibromatosis: Secondary | ICD-10-CM | POA: Diagnosis not present

## 2021-03-23 DIAGNOSIS — R55 Syncope and collapse: Secondary | ICD-10-CM | POA: Diagnosis not present

## 2021-03-27 DIAGNOSIS — E860 Dehydration: Secondary | ICD-10-CM | POA: Diagnosis not present

## 2021-03-27 DIAGNOSIS — K529 Noninfective gastroenteritis and colitis, unspecified: Secondary | ICD-10-CM | POA: Diagnosis not present

## 2021-03-30 ENCOUNTER — Telehealth: Payer: Self-pay | Admitting: Pharmacist

## 2021-03-30 NOTE — Telephone Encounter (Signed)
Patient called asking about why she is feeling fatigued. States that she wants to want to do things, but seems to be lacking the drive or energy to do things. States if she makes her self get up and do things she is ok, but if she sits down she feels really tired.  Upset that she doesn't want to do things. Called to ask if we talked about it during our visit as her memory is not great. Wants to know if its the medication or her heart. I explained that this sounds a lot like depression. Not sure her heart is playing a big role. During our visit I gave her the option of changing her Entresto, but she did not want to.  We talked about how exercise can help give her energy and improve mood. Does not exercise because she does not like to. Was swimming [facility] the [facility] but states it started hurting her back.  She states that she is willing to walk. Encouraged her to do so at a moderate pace.  Asked me about the scaring on her heart, but I did not see record of this. She thanked me for my time and said she was waiting for Dr. Marlou Porch to respond to her mychart.

## 2021-04-07 DIAGNOSIS — R112 Nausea with vomiting, unspecified: Secondary | ICD-10-CM | POA: Diagnosis not present

## 2021-04-07 DIAGNOSIS — K529 Noninfective gastroenteritis and colitis, unspecified: Secondary | ICD-10-CM | POA: Diagnosis not present

## 2021-04-10 ENCOUNTER — Encounter: Payer: Self-pay | Admitting: Cardiology

## 2021-04-10 ENCOUNTER — Ambulatory Visit: Payer: BC Managed Care – PPO | Admitting: Cardiology

## 2021-04-10 ENCOUNTER — Other Ambulatory Visit: Payer: Self-pay

## 2021-04-10 DIAGNOSIS — E118 Type 2 diabetes mellitus with unspecified complications: Secondary | ICD-10-CM

## 2021-04-10 DIAGNOSIS — I2510 Atherosclerotic heart disease of native coronary artery without angina pectoris: Secondary | ICD-10-CM

## 2021-04-10 DIAGNOSIS — E7800 Pure hypercholesterolemia, unspecified: Secondary | ICD-10-CM | POA: Diagnosis not present

## 2021-04-10 DIAGNOSIS — K529 Noninfective gastroenteritis and colitis, unspecified: Secondary | ICD-10-CM | POA: Diagnosis not present

## 2021-04-10 NOTE — Progress Notes (Signed)
Cardiology Office Note:    Date:  04/10/2021   ID:  Traci Roy, DOB Jul 09, 1960, MRN [id number]  PCP:  Traci Jordan, MD   [facility] Providers Cardiologist:  Candee Furbish, MD     Referring MD: Traci Jordan, MD    History of Present Illness:    Traci Roy is a 61 y.o. female here for the follow-up of coronary artery disease with stent placement to LAD and 12/18/2019.  EF at that time was 40 to 45%.  At prior visit, she was quite concerned about her echo report as well as diagnosis of chronic systolic heart failure.  She was concerned about her mortality dramatically increasing however she is NYHA class I typically.  She has had fatigue for quite some time.  Her memory has been not the best.  She called wanting to know if this was her heart medication.  She was encouraged by her pharmacy team to start walking.  Did not like swimming because it hurt her back at the [facility].  She was offered to switch Entresto to another ARB if she really thought it was contributing to her fatigue.  She decided to continue Entresto.  I also talked about GLP-1, cardiovascular benefit as well as weight loss diabetes control.  She was resistant to multiple medications.  Resources were provided on good nutrition, Zoe podcast for instance and she was quite honest that she probably was not going to listen to a podcast to read a book or count anything.  She is worried about the ramifications of weight loss surgery.  Past Medical History:  Diagnosis Date   Arthritis    Depression    takes Lexapro daily   Dizziness    occasionally and no meds   Flu    end of Dec 2014   GERD (gastroesophageal reflux disease)    occasionally and no meds required   History of bronchitis    last time many yrs ago   History of duodenal ulcer    History of shingles 3-5 YRS AGO   Hypertension    takes Losartan and HCTZ daily   Joint pain    Restless legs     Past Surgical History:  Procedure Laterality Date    BREAST ENHANCEMENT SURGERY Bilateral    CESAREAN SECTION     X 1   COLONOSCOPY     CORONARY STENT INTERVENTION N/A 12/18/2019   Procedure: CORONARY STENT INTERVENTION;  Surgeon: Jettie Booze, MD;  Location: [place] [facility];  Service: Cardiovascular;  Laterality: N/A;   enlarged bladder as a child     HAD SURGERY TO ENLARGE BLADDER   INTRAVASCULAR PRESSURE WIRE/FFR STUDY N/A 12/18/2019   Procedure: INTRAVASCULAR PRESSURE WIRE/FFR STUDY;  Surgeon: Jettie Booze, MD;  Location: [place] [facility];  Service: Cardiovascular;  Laterality: N/A;   RIGHT/LEFT HEART CATH AND CORONARY ANGIOGRAPHY N/A 12/18/2019   Procedure: RIGHT/LEFT HEART CATH AND CORONARY ANGIOGRAPHY;  Surgeon: Jettie Booze, MD;  Location: [place] [facility];  Service: Cardiovascular;  Laterality: N/A;   TONSILLECTOMY     TOTAL HIP ARTHROPLASTY Right 04/14/2013   Procedure: TOTAL HIP ARTHROPLASTY ANTERIOR APPROACH;  Surgeon: Hessie Dibble, MD;  Location: [place];  Service: Orthopedics;  Laterality: Right;    Current Medications: Current Meds  Medication Sig   ALPRAZolam (XANAX) 0.25 MG tablet Take 0.25 mg by mouth daily.   aspirin EC 81 MG tablet Take 1 tablet (81 mg total) by mouth daily. Swallow whole.  atorvastatin (LIPITOR) 40 MG tablet Take 40 mg by mouth daily.   escitalopram (LEXAPRO) 20 MG tablet Take 20 mg by mouth daily.   Magnesium Oxide 250 MG TABS Take 250 mg by mouth at bedtime.   nitroGLYCERIN (NITROSTAT) 0.4 MG SL tablet PLACE 1 TABLET (0.4 MG TOTAL) UNDER THE TONGUE EVERY FIVE MINUTES AS NEEDED FOR CHEST PAIN.   sacubitril-valsartan (ENTRESTO) 24-26 MG Take 1 tablet by mouth 2 (two) times daily.   TRULICITY 3.26 ZT/2.4PY SOPN SMARTSIG:0.5 Milliliter(s) SUB-Q Once a Week   vitamin B-12 (CYANOCOBALAMIN) 500 MCG tablet 1 tablet   Vitamin D3 (VITAMIN D) 25 MCG tablet 1 tablet     Allergies:   Lisinopril and Adhesive [tape]   Social History   Socioeconomic History   Marital  status: Married    Spouse name: Not on file   Number of children: Not on file   Years of education: Not on file   Highest education level: Not on file  Occupational History   Not on file  Tobacco Use   Smoking status: Never   Smokeless tobacco: Never  Substance and Sexual Activity   Alcohol use: Yes    Comment: glass of wine every now and then   Drug use: No   Sexual activity: Yes    Birth control/protection: Other-see comments  Other Topics Concern   Not on file  Social History Narrative   Not on file   Social Determinants of Health   Financial Resource Strain: Not on file  Food Insecurity: Not on file  Transportation Needs: Not on file  Physical Activity: Not on file  Stress: Not on file  Social Connections: Not on file     Family History: The patient's Family history is unknown by patient.  ROS:   Please see the history of present illness.     All other systems reviewed and are negative.  EKGs/Labs/Other Studies Reviewed:    Recent Labs: 03/07/2021: BUN 20; Creatinine, Ser 0.94; Potassium 4.3; Sodium 143  Recent Lipid Panel No results found for: CHOL, TRIG, HDL, CHOLHDL, VLDL, LDLCALC, LDLDIRECT   Risk Assessment/Calculations:              Physical Exam:    VS:  BP 100/78 (BP Location: Left Arm, Patient Position: Sitting, Cuff Size: Large)    Pulse 94    Ht '5\' 3"'$  (1.6 m)    Wt 241 lb 12.8 oz (109.7 kg)    LMP 02/05/2014    SpO2 97%    BMI 42.83 kg/m     Wt Readings from Last 3 Encounters:  04/10/21 241 lb 12.8 oz (109.7 kg)  03/07/21 250 lb (113.4 kg)  11/04/20 249 lb (112.9 kg)     GEN:  Well nourished, well developed in no acute distress HEENT: Normal NECK: No JVD; No carotid bruits LYMPHATICS: No lymphadenopathy CARDIAC: RRR, no murmurs, no rubs, gallops RESPIRATORY:  Clear to auscultation without rales, wheezing or rhonchi  ABDOMEN: Soft, non-tender, non-distended MUSCULOSKELETAL:  No edema; No deformity  SKIN: Warm and dry NEUROLOGIC:   Alert and oriented x 3 PSYCHIATRIC:  Normal affect   ASSESSMENT:    1. Coronary artery disease involving native coronary artery of native heart without angina pectoris   2. Pure hypercholesterolemia   3. Morbid obesity ([place])   4. Type 2 diabetes mellitus with complication, without long-term current use of insulin (HCC)    PLAN:    In order of problems listed above:  CAD (coronary artery disease) .LAD stent  2021 aspirin 81.  Cardiac catheterization reviewed.  No chest discomfort.  Repeat echocardiogram showed EF of 45%.  Mildly reduced.  Has been concerned about many of the medications including Entresto.  We decided to pull off the [facility] for 2 weeks to see how she feels.  A 2-week holiday will not damage her heart.  If she is still feeling the fatigue, we will ask her to go back on the Entresto low-dose 24/26.  Regardless, we will be repeating echocardiogram at future time.  Extensive counseling has taken place via our pharmacy team as well.  Hyperlipidemia Continuing with atorvastatin 40 mg once a day, high intensity statin.  LDL goal less than 70.  Morbid obesity ([place]) We will refer her to healthy weight and wellness clinic.  She has excellent questions.  I think would be beneficial to discuss this with a weight loss specialist.  Type 2 diabetes mellitus with complication, without long-term current use of insulin ([place]) Could consider SGLT2 inhibitor or GLP-1.         Medication Adjustments/Labs and Tests Ordered: Current medicines are reviewed at length with the patient today.  Concerns regarding medicines are outlined above.  Orders Placed This Encounter  Procedures   Amb Ref to Medical Weight Management   No orders of the defined types were placed in this encounter.   Patient Instructions  Medication Instructions:  Please hold your Entresto for 2 weeks.  Please let us know how you feel off of it. Continue all other medications as listed.  *If you need a refill on  your cardiac medications before your next appointment, please call your pharmacy*  You have been referred to Medical Weight Management.    Follow-Up: At [facility], you and your health needs are our priority.  As part of our continuing mission to provide you with exceptional heart care, we have created designated Provider Care Teams.  These Care Teams include your primary Cardiologist (physician) and Advanced Practice Providers (APPs -  Physician Assistants and Nurse Practitioners) who all work together to provide you with the care you need, when you need it.  We recommend signing up for the patient portal called "MyChart".  Sign up information is provided on this After Visit Summary.  MyChart is used to connect with patients for Virtual Visits (Telemedicine).  Patients are able to view lab/test results, encounter notes, upcoming appointments, etc.  Non-urgent messages can be sent to your provider as well.   To learn more about what you can do with MyChart, go to [email or web address].    Your next appointment:   6 months with Dr Marlou Porch.  Thank you for choosing [facility]!!      Signed, Candee Furbish, MD  04/10/2021 11:43 AM    [place]

## 2021-04-10 NOTE — Assessment & Plan Note (Signed)
Could consider SGLT2 inhibitor or GLP-1. ?

## 2021-04-10 NOTE — Assessment & Plan Note (Signed)
Continuing with atorvastatin 40 mg once a day, high intensity statin.  LDL goal less than 70. ?

## 2021-04-10 NOTE — Patient Instructions (Addendum)
Medication Instructions:  ?Please hold your Entresto for 2 weeks.  Please let us know how you feel off of it. ?Continue all other medications as listed. ? ?*If you need a refill on your cardiac medications before your next appointment, please call your pharmacy* ? ?You have been referred to Medical Weight Management.   ? ?Follow-Up: ?At [facility], you and your health needs are our priority.  As part of our continuing mission to provide you with exceptional heart care, we have created designated Provider Care Teams.  These Care Teams include your primary Cardiologist (physician) and Advanced Practice Providers (APPs -  Physician Assistants and Nurse Practitioners) who all work together to provide you with the care you need, when you need it. ? ?We recommend signing up for the patient portal called "MyChart".  Sign up information is provided on this After Visit Summary.  MyChart is used to connect with patients for Virtual Visits (Telemedicine).  Patients are able to view lab/test results, encounter notes, upcoming appointments, etc.  Non-urgent messages can be sent to your provider as well.   ?To learn more about what you can do with MyChart, go to [email or web address].   ? ?Your next appointment:   ?6 months with Dr Marlou Porch. ? ?Thank you for choosing [place]!! ? ? ? ?

## 2021-04-10 NOTE — Assessment & Plan Note (Signed)
We will refer her to healthy weight and wellness clinic.  She has excellent questions.  I think would be beneficial to discuss this with a weight loss specialist. ?

## 2021-04-10 NOTE — Assessment & Plan Note (Addendum)
.  LAD stent 2021 aspirin 81.  Cardiac catheterization reviewed.  No chest discomfort.  Repeat echocardiogram showed EF of 45%.  Mildly reduced.  Has been concerned about many of the medications including Entresto.  We decided to pull off the [employer] for 2 weeks to see how she feels.  A 2-week holiday will not damage her heart.  If she is still feeling the fatigue, we will ask her to go back on the Entresto low-dose 24/26.  Regardless, we will be repeating echocardiogram at future time.  Extensive counseling has taken place via our pharmacy team as well. ?

## 2021-04-12 ENCOUNTER — Encounter: Payer: Self-pay | Admitting: Cardiology

## 2021-04-13 DIAGNOSIS — F4321 Adjustment disorder with depressed mood: Secondary | ICD-10-CM | POA: Diagnosis not present

## 2021-04-21 ENCOUNTER — Ambulatory Visit: Payer: BC Managed Care – PPO | Admitting: Cardiology

## 2021-05-10 ENCOUNTER — Ambulatory Visit: Payer: BC Managed Care – PPO | Admitting: Cardiology

## 2021-06-10 ENCOUNTER — Other Ambulatory Visit: Payer: Self-pay

## 2021-06-10 ENCOUNTER — Emergency Department (HOSPITAL_COMMUNITY)
Admission: EM | Admit: 2021-06-10 | Discharge: 2021-06-10 | Disposition: A | Discharge status: Home / Self Care | Payer: BC Managed Care – PPO | Attending: Student | Admitting: Student

## 2021-06-10 ENCOUNTER — Encounter (HOSPITAL_COMMUNITY): Payer: Self-pay | Admitting: Emergency Medicine

## 2021-06-10 DIAGNOSIS — I509 Heart failure, unspecified: Secondary | ICD-10-CM | POA: Diagnosis not present

## 2021-06-10 DIAGNOSIS — Z79899 Other long term (current) drug therapy: Secondary | ICD-10-CM | POA: Insufficient documentation

## 2021-06-10 DIAGNOSIS — Z8616 Personal history of COVID-19: Secondary | ICD-10-CM | POA: Insufficient documentation

## 2021-06-10 DIAGNOSIS — R1013 Epigastric pain: Secondary | ICD-10-CM | POA: Diagnosis not present

## 2021-06-10 DIAGNOSIS — Z7982 Long term (current) use of aspirin: Secondary | ICD-10-CM | POA: Diagnosis not present

## 2021-06-10 DIAGNOSIS — E119 Type 2 diabetes mellitus without complications: Secondary | ICD-10-CM | POA: Insufficient documentation

## 2021-06-10 DIAGNOSIS — I110 Hypertensive heart disease with heart failure: Secondary | ICD-10-CM | POA: Insufficient documentation

## 2021-06-10 DIAGNOSIS — R197 Diarrhea, unspecified: Secondary | ICD-10-CM | POA: Diagnosis not present

## 2021-06-10 DIAGNOSIS — K2970 Gastritis, unspecified, without bleeding: Secondary | ICD-10-CM

## 2021-06-10 DIAGNOSIS — I2510 Atherosclerotic heart disease of native coronary artery without angina pectoris: Secondary | ICD-10-CM | POA: Insufficient documentation

## 2021-06-10 DIAGNOSIS — Z955 Presence of coronary angioplasty implant and graft: Secondary | ICD-10-CM | POA: Insufficient documentation

## 2021-06-10 DIAGNOSIS — K2900 Acute gastritis without bleeding: Secondary | ICD-10-CM | POA: Diagnosis not present

## 2021-06-10 HISTORY — DX: Type 2 diabetes mellitus without complications: E11.9

## 2021-06-10 HISTORY — DX: Heart failure, unspecified: I50.9

## 2021-06-10 LAB — COMPREHENSIVE METABOLIC PANEL
ALT: 22 U/L (ref 0–44)
AST: 21 U/L (ref 15–41)
Albumin: 3.8 g/dL (ref 3.5–5.0)
Alkaline Phosphatase: 105 U/L (ref 38–126)
Anion gap: 7 (ref 5–15)
BUN: 14 mg/dL (ref 8–23)
CO2: 26 mmol/L (ref 22–32)
Calcium: 9.1 mg/dL (ref 8.9–10.3)
Chloride: 106 mmol/L (ref 98–111)
Creatinine, Ser: 1.11 mg/dL — ABNORMAL HIGH (ref 0.44–1.00)
GFR, Estimated: 57 mL/min — ABNORMAL LOW (ref >60)
Glucose, Bld: 92 mg/dL (ref 70–99)
Potassium: 4.1 mmol/L (ref 3.5–5.1)
Sodium: 139 mmol/L (ref 135–145)
Total Bilirubin: 0.8 mg/dL (ref 0.3–1.2)
Total Protein: 6.7 g/dL (ref 6.5–8.1)

## 2021-06-10 LAB — CBC
HCT: 41.5 % (ref 36.0–46.0)
Hemoglobin: 13.6 g/dL (ref 12.0–15.0)
MCH: 30.8 pg (ref 26.0–34.0)
MCHC: 32.8 g/dL (ref 30.0–36.0)
MCV: 94.1 fL (ref 80.0–100.0)
Platelets: 287 K/uL (ref 150–400)
RBC: 4.41 MIL/uL (ref 3.87–5.11)
RDW: 12.3 % (ref 11.5–15.5)
WBC: 9 K/uL (ref 4.0–10.5)
nRBC: 0 % (ref 0.0–0.2)

## 2021-06-10 LAB — URINALYSIS, ROUTINE W REFLEX MICROSCOPIC
Bilirubin Urine: NEGATIVE
Glucose, UA: NEGATIVE mg/dL
Hgb urine dipstick: NEGATIVE
Ketones, ur: NEGATIVE mg/dL
Leukocytes,Ua: NEGATIVE
Nitrite: NEGATIVE
Protein, ur: NEGATIVE mg/dL
Specific Gravity, Urine: 1.021 (ref 1.005–1.030)
pH: 5 (ref 5.0–8.0)

## 2021-06-10 LAB — LIPASE, BLOOD: Lipase: 31 U/L (ref 11–51)

## 2021-06-10 LAB — TROPONIN I (HIGH SENSITIVITY): Troponin I (High Sensitivity): 4 ng/L

## 2021-06-10 MED ORDER — LIDOCAINE VISCOUS HCL 2 % MT SOLN
15.0000 mL | Freq: Once | OROMUCOSAL | Status: AC
  Administered 2021-06-10: 15 mL via ORAL
  Filled 2021-06-10: qty 15

## 2021-06-10 MED ORDER — FAMOTIDINE 20 MG PO TABS: 20.0000 mg | ORAL_TABLET | Freq: Two times a day (BID) | ORAL | 0 refills | Status: DC | PRN

## 2021-06-10 MED ORDER — OMEPRAZOLE 20 MG PO CPDR: 20.0000 mg | DELAYED_RELEASE_CAPSULE | Freq: Every day | ORAL | 0 refills | Status: DC

## 2021-06-10 MED ORDER — ALUM & MAG HYDROXIDE-SIMETH 200-200-20 MG/5ML PO SUSP
30.0000 mL | Freq: Once | ORAL | Status: AC
  Administered 2021-06-10: 30 mL via ORAL
  Filled 2021-06-10: qty 30

## 2021-06-10 NOTE — ED Provider Notes (Signed)
?[facility] ?Provider Note ? ?CSN: [id number] ?Arrival date & time: 06/10/21 0908 ? ?Chief Complaint(s) ?No chief complaint on file. ? ?HPI ?Traci Roy is a 61 y.o. female with PMH CHF, GERD who presents emergency department for evaluation of abdominal pain.  Patient states that she has had this recent history of persistent chronic diarrhea that initially improved with Cipro but has since returned.  She states that the diarrhea has been easily managed with Imodium, but today she is having worsening epigastric abdominal pain.  She is on no medications for her reflux.  She denies chest pain, shortness of breath, headache, fever or other systemic symptoms. ? ? ?Past Medical History ?Past Medical History:  ?Diagnosis Date  ? Arthritis   ? CHF (congestive heart failure) ([place])   ? Depression   ? takes Lexapro daily  ? Diabetes mellitus without complication ([place])   ? Dizziness   ? occasionally and no meds  ? Flu   ? end of Dec 2014  ? GERD (gastroesophageal reflux disease)   ? occasionally and no meds required  ? History of bronchitis   ? last time many yrs ago  ? History of duodenal ulcer   ? History of shingles 3-5 YRS AGO  ? Hypertension   ? takes Losartan and HCTZ daily  ? Joint pain   ? Restless legs   ? ?Patient Active Problem List  ? Diagnosis Date Noted  ? Morbid obesity ([place]) 04/10/2021  ? Type 2 diabetes mellitus with complication, without long-term current use of insulin ([place]) 04/10/2021  ? CAD (coronary artery disease) 11/06/2020  ? Left ventricular dysfunction 11/06/2020  ? Hyperlipidemia 11/06/2020  ? Angina pectoris ([place])   ? Essential hypertension 10/22/2019  ? Pneumonia due to COVID-19 virus 10/22/2019  ? Acute respiratory failure with hypoxia ([place]) 10/22/2019  ? AKI (acute kidney injury) ([place]) 10/22/2019  ? Hypokalemia 10/22/2019  ? 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 ?Home Medication(s) ?Prior to Admission medications   ?Medication Sig Start  Date End Date Taking? Authorizing Provider  ?ALPRAZolam (XANAX) 0.25 MG tablet Take 0.25 mg by mouth daily. 10/12/20   [provider]  ?aspirin EC 81 MG tablet Take 1 tablet (81 mg total) by mouth daily. Swallow whole. 12/18/19   Leanor Kail, PA  ?atorvastatin (LIPITOR) 40 MG tablet Take 40 mg by mouth daily. 02/26/20   [provider]  ?escitalopram (LEXAPRO) 20 MG tablet Take 20 mg by mouth daily. 09/04/19   [provider]  ?Magnesium Oxide 250 MG TABS Take 250 mg by mouth at bedtime.    [provider]  ?nitroGLYCERIN (NITROSTAT) 0.4 MG SL tablet PLACE 1 TABLET (0.4 MG TOTAL) UNDER THE TONGUE EVERY FIVE MINUTES AS NEEDED FOR CHEST PAIN. 12/18/19 04/10/21  Leanor Kail, PA  ?sacubitril-valsartan (ENTRESTO) 24-26 MG Take 1 tablet by mouth 2 (two) times daily. 10/29/20   Isaiah Serge, NP  ?TRULICITY 5.62 ZH/0.8MV SOPN SMARTSIG:0.5 Milliliter(s) SUB-Q Once a Week 03/16/21   [provider]  ?vitamin B-12 (CYANOCOBALAMIN) 500 MCG tablet 1 tablet    [provider]  ?Vitamin D3 (VITAMIN D) 25 MCG tablet 1 tablet    [provider]  ?                                                                                                                                  ?  Past Surgical History ?Past Surgical History:  ?Procedure Laterality Date  ? BREAST ENHANCEMENT SURGERY Bilateral   ? CESAREAN SECTION    ? X 1  ? COLONOSCOPY    ? CORONARY STENT INTERVENTION N/A 12/18/2019  ? Procedure: CORONARY STENT INTERVENTION;  Surgeon: Jettie Booze, MD;  Location: [place] [facility];  Service: Cardiovascular;  Laterality: N/A;  ? enlarged bladder as a child    ? HAD SURGERY TO ENLARGE BLADDER  ? INTRAVASCULAR PRESSURE WIRE/FFR STUDY N/A 12/18/2019  ? Procedure: INTRAVASCULAR PRESSURE WIRE/FFR STUDY;  Surgeon: Jettie Booze, MD;  Location: [place] [facility];  Service: Cardiovascular;  Laterality: N/A;  ? RIGHT/LEFT HEART CATH AND CORONARY ANGIOGRAPHY  N/A 12/18/2019  ? Procedure: RIGHT/LEFT HEART CATH AND CORONARY ANGIOGRAPHY;  Surgeon: Jettie Booze, MD;  Location: [place] [facility];  Service: Cardiovascular;  Laterality: N/A;  ? TONSILLECTOMY    ? TOTAL HIP ARTHROPLASTY Right 04/14/2013  ? Procedure: TOTAL HIP ARTHROPLASTY ANTERIOR APPROACH;  Surgeon: Hessie Dibble, MD;  Location: [place];  Service: Orthopedics;  Laterality: Right;  ? ?Family History ?Family History  ?Family history unknown: Yes  ? ? ?Social History ?Social History  ? ?Tobacco Use  ? Smoking status: Never  ? Smokeless tobacco: Never  ?Substance Use Topics  ? Alcohol use: Yes  ?  Comment: glass of wine every now and then  ? Drug use: No  ? ?Allergies ?Lisinopril and Adhesive [tape] ? ?Review of Systems ?Review of Systems  ?Gastrointestinal:  Positive for abdominal pain.  ? ?Physical Exam ?Vital Signs  ?I have reviewed the triage vital signs ?BP 130/73   Pulse 85   Temp 97.6 ?F (36.4 ?C) (Oral)   Resp 18   LMP 02/05/2014   SpO2 95%  ? ?Physical Exam ?Vitals and nursing note reviewed.  ?Constitutional:   ?   General: She is not in acute distress. ?   Appearance: She is well-developed.  ?HENT:  ?   Head: Normocephalic and atraumatic.  ?Eyes:  ?   Conjunctiva/sclera: Conjunctivae normal.  ?Cardiovascular:  ?   Rate and Rhythm: Normal rate and regular rhythm.  ?   Heart sounds: No murmur heard. ?Pulmonary:  ?   Effort: Pulmonary effort is normal. No respiratory distress.  ?   Breath sounds: Normal breath sounds.  ?Abdominal:  ?   Palpations: Abdomen is soft.  ?   Tenderness: There is abdominal tenderness (Epigastric).  ?Musculoskeletal:     ?   General: No swelling.  ?   Cervical back: Neck supple.  ?Skin: ?   General: Skin is warm and dry.  ?   Capillary Refill: Capillary refill takes less than 2 seconds.  ?Neurological:  ?   Mental Status: She is alert.  ?Psychiatric:     ?   Mood and Affect: Mood normal.  ? ? ?ED Results and Treatments ?Labs ?(all labs ordered are listed, but only  abnormal results are displayed) ?Labs Reviewed  ?COMPREHENSIVE METABOLIC PANEL - Abnormal; Notable for the following components:  ?    Result Value  ? Creatinine, Ser 1.11 (*)   ? GFR, Estimated 57 (*)   ? All other components within normal limits  ?URINALYSIS, ROUTINE W REFLEX MICROSCOPIC - Abnormal; Notable for the following components:  ? APPearance HAZY (*)   ? All other components within normal limits  ?LIPASE, BLOOD  ?CBC  ?TROPONIN I (HIGH SENSITIVITY)  ?TROPONIN I (HIGH SENSITIVITY)  ?                                                                                                                       ? ?  Radiology ?No results found. ? ?Pertinent labs & imaging results that were available during my care of the patient were reviewed by me and considered in my medical decision making (see MDM for details). ? ?Medications Ordered in ED ?Medications  ?alum & mag hydroxide-simeth (MAALOX/MYLANTA) 200-200-20 MG/5ML suspension 30 mL (has no administration in time range)  ?  And  ?lidocaine (XYLOCAINE) 2 % viscous mouth solution 15 mL (has no administration in time range)  ?                                                               ?                                                                    ?Procedures ?Procedures ? ?(including critical care time) ? ?Medical Decision Making / ED Course ? ? ?This patient presents to the ED for concern of abdominal pain, this involves an extensive number of treatment options, and is a complaint that carries with it a high risk of complications and morbidity.  The differential diagnosis includes gastritis, pancreatitis, biliary pathology, enteritis ? ?MDM: ?Patient to the emergency room for evaluation of abdominal pain.  Physical exam reveals mild epigastric abdominal tenderness to palpation but is otherwise unremarkable.  Laboratory evaluation unremarkable.  Urinalysis unremarkable.  Patient presentation appears to be consistent with gastritis and thus we trialed a GI  cocktail and on reevaluation after GI cocktail administration patient feels significantly better.  I have very low suspicion for surgical intra-abdominal pathology given benign work-up and exam and thus patient disc

## 2021-06-10 NOTE — ED Triage Notes (Addendum)
Pt reports chronic diarrhea x 3 months that was relieved after taking Cipro.  Reports upper abd pain/epigastric pain x 2-3 days with belching, nausea, and diarrhea. ? ?

## 2021-06-16 DIAGNOSIS — M545 Low back pain, unspecified: Secondary | ICD-10-CM | POA: Diagnosis not present

## 2021-06-16 DIAGNOSIS — I2510 Atherosclerotic heart disease of native coronary artery without angina pectoris: Secondary | ICD-10-CM | POA: Diagnosis not present

## 2021-06-16 DIAGNOSIS — M1990 Unspecified osteoarthritis, unspecified site: Secondary | ICD-10-CM | POA: Diagnosis not present

## 2021-06-19 ENCOUNTER — Telehealth: Payer: Self-pay | Admitting: *Deleted

## 2021-06-19 ENCOUNTER — Other Ambulatory Visit (HOSPITAL_COMMUNITY): Payer: Self-pay | Admitting: Surgery

## 2021-06-19 ENCOUNTER — Other Ambulatory Visit: Payer: Self-pay | Admitting: Surgery

## 2021-06-19 NOTE — Telephone Encounter (Signed)
Left message for Traci Roy to please call the office [phone], can leave vm needed. See notes from earlier today.  ?

## 2021-06-19 NOTE — Telephone Encounter (Signed)
Notes were received today for pre op clearance. I tried to reach out to requesting office for further needed information, though no answer. I will place clearance info in with what I currently have and fax a note to requesting office for needed info. ? ? ? ?Pre-operative Risk Assessment  ?  ?Patient Name: Traci Roy  ?DOB: 11/28/1960 ?MRN: [id number]  ? ?  ? ?Request for Surgical Clearance   ? ?Procedure:   SLEEVE GASTRECTOMY ? ?Date of Surgery:  Clearance TBD                              ?   ?Surgeon:  DR. Romana Juniper ?Surgeon's Group or Practice Name:  [facility] ?Phone number:  [phone] ?Fax number:  [phone] ?  ?Type of Clearance Requested:   ?- Medical  (PT IS ON ASA 81 MG ; WILL NEED TO CONFIRM IF ASA NEEDS TO BE HELD) ?  ?Type of Anesthesia:  Not Indicated WILL NEED TO CONFIRM TYPE OF ANESTHESIA BEING USED ?  ?Additional requests/questions:   ? ?Signed, ?Julaine Hua   ?06/19/2021, 1:45 PM  ? ?

## 2021-06-19 NOTE — Telephone Encounter (Signed)
April from Dr. Kae Heller office called back and left vm for me to call her back. I called back left vm. It is the end of the day 5 pm. Left message that I will try to reach her tomorrow.  ?

## 2021-06-20 NOTE — Telephone Encounter (Signed)
? ?  Name: Traci Roy  ?DOB: Apr 18, 1960  ?MRN: [id number] ? ?Primary Cardiologist: Candee Furbish, MD ? ?Chart reviewed as part of pre-operative protocol coverage. Because of [name]'s past medical history and time since last visit, she will require a follow-up in-office visit in order to better assess preoperative cardiovascular risk. ? ?Pre-op covering staff: ?- Please schedule appointment and call patient to inform them. If patient already had an upcoming appointment within acceptable timeframe, please add "pre-op clearance" to the appointment notes so provider is aware. ?- Please contact requesting surgeon's office via preferred method (i.e, phone, fax) to inform them of need for appointment prior to surgery. ? ?This message will also be routed to Dr. Marlou Porch for input on holding Aspirin as requested below so that this information is available to the clearing provider at time of patient's appointment.  ? ?Lenna Sciara, NP  ?06/20/2021, 2:58 PM  ? ?

## 2021-06-20 NOTE — Telephone Encounter (Signed)
Pt agreeable to IN OFFICE APPT for pre op clearance per the pre  op provide. Pt has been scheduled to see Dr. Marlou Porch 06/23/21 @ 8:30 am. Pt is grateful for the help and the call. I will send FYI to requesting office pt has appt 06/23/21.  ?

## 2021-06-20 NOTE — Telephone Encounter (Signed)
Left message for April ok to leave detailed message on vm in regard to if ASA needs to be held as well as type of anesthesia to be used.  ?

## 2021-06-20 NOTE — Telephone Encounter (Signed)
I s/w April with Dr. Kae Heller office. April has confirmed ASA will need to be held and anesthesia will be general. I will forward to pre op provider.  ? ?ADDENDUM:  ?MEDS TO HOLD-ASA ?ANESTHESIA-GENERAL ?

## 2021-06-23 ENCOUNTER — Ambulatory Visit: Payer: BC Managed Care – PPO | Admitting: Cardiology

## 2021-06-23 ENCOUNTER — Other Ambulatory Visit (HOSPITAL_COMMUNITY): Payer: Self-pay | Admitting: Surgery

## 2021-06-23 ENCOUNTER — Encounter: Payer: Self-pay | Admitting: Cardiology

## 2021-06-23 DIAGNOSIS — E7800 Pure hypercholesterolemia, unspecified: Secondary | ICD-10-CM

## 2021-06-23 DIAGNOSIS — Z01810 Encounter for preprocedural cardiovascular examination: Secondary | ICD-10-CM | POA: Insufficient documentation

## 2021-06-23 DIAGNOSIS — I519 Heart disease, unspecified: Secondary | ICD-10-CM | POA: Diagnosis not present

## 2021-06-23 DIAGNOSIS — I2510 Atherosclerotic heart disease of native coronary artery without angina pectoris: Secondary | ICD-10-CM | POA: Diagnosis not present

## 2021-06-23 DIAGNOSIS — E118 Type 2 diabetes mellitus with unspecified complications: Secondary | ICD-10-CM

## 2021-06-23 NOTE — Assessment & Plan Note (Signed)
Ejection fraction 45%.  Entresto.  Doing well.  Blood pressure excellent.  No symptoms.  NYHA class I.  No changes made.  Stable.

## 2021-06-23 NOTE — Assessment & Plan Note (Signed)
Stable, doing well

## 2021-06-23 NOTE — Assessment & Plan Note (Signed)
Prior LAD stent in 2021.  If she must hold her aspirin prior to gastric sleeve procedure this is okay.  No anginal symptoms.

## 2021-06-23 NOTE — Patient Instructions (Signed)
Medication Instructions:  The current medical regimen is effective;  continue present plan and medications.  *If you need a refill on your cardiac medications before your next appointment, please call your pharmacy*  OK for surgery.  Follow-Up: At [facility], you and your health needs are our priority.  As part of our continuing mission to provide you with exceptional heart care, we have created designated Provider Care Teams.  These Care Teams include your primary Cardiologist (physician) and Advanced Practice Providers (APPs -  Physician Assistants and Nurse Practitioners) who all work together to provide you with the care you need, when you need it.  We recommend signing up for the patient portal called "MyChart".  Sign up information is provided on this After Visit Summary.  MyChart is used to connect with patients for Virtual Visits (Telemedicine).  Patients are able to view lab/test results, encounter notes, upcoming appointments, etc.  Non-urgent messages can be sent to your provider as well.   To learn more about what you can do with MyChart, go to [email or web address].    Your next appointment:   6 month(s)  The format for your next appointment:   In Person  Provider:   Candee Furbish, MD {   Important Information About Sugar

## 2021-06-23 NOTE — Assessment & Plan Note (Signed)
She may proceed with gastric sleeve with low overall cardiac risk.  Most recent echocardiogram demonstrates ejection fraction of approximately 45%.  She is NYHA class I, having no symptoms.  No shortness of breath no orthopnea no chest pain.  Overall reassuring.  She is on Entresto and doing very well.  I would be comfortable with her holding her aspirin 81 mg prior to the procedure for 5 days.

## 2021-06-23 NOTE — Assessment & Plan Note (Signed)
Continue with atorvastatin 40 mg a day.  LDL 85.  Ultimate goal would be less than 70.

## 2021-06-23 NOTE — Progress Notes (Signed)
Cardiology Office Note:    Date:  06/23/2021   ID:  Traci Roy, DOB 1960/11/17, MRN [id number]  PCP:  Traci Jordan, MD   [facility] Providers Cardiologist:  Candee Furbish, MD     Referring MD: Traci Jordan, MD    History of Present Illness:    Traci Roy is a 61 y.o. female here for preoperative sleeve gastrectomy cardiac evaluation.  Follow-up coronary artery disease with prior LAD stent placement in November 2021.  EF at that time was 40-45%.  She has been battling with weight loss.  Saw Dr. Windle Guard general surgery-12/23.  Notes reviewed.  She went for bariatric surgery evaluation.  She discussed particularly sleeve gastrectomy.  We previously discussed her diagnosis of chronic systolic heart failure.  She is currently NYHA class I.  She was concerned about her mortality.  Reassurance was given given her NYHA class and current medication strategy.  She denies any chest pain shortness of breath orthopnea PND syncope bleeding.  Doing well.  Past Medical History:  Diagnosis Date   Arthritis    CHF (congestive heart failure) ([place])    Depression    takes Lexapro daily   Diabetes mellitus without complication (HCC)    Dizziness    occasionally and no meds   Flu    end of Dec 2014   GERD (gastroesophageal reflux disease)    occasionally and no meds required   History of bronchitis    last time many yrs ago   History of duodenal ulcer    History of shingles 3-5 YRS AGO   Hypertension    takes Losartan and HCTZ daily   Joint pain    Restless legs     Past Surgical History:  Procedure Laterality Date   BREAST ENHANCEMENT SURGERY Bilateral    CESAREAN SECTION     X 1   COLONOSCOPY     CORONARY STENT INTERVENTION N/A 12/18/2019   Procedure: CORONARY STENT INTERVENTION;  Surgeon: Jettie Booze, MD;  Location: [place] [facility];  Service: Cardiovascular;  Laterality: N/A;   enlarged bladder as a child     HAD SURGERY TO ENLARGE BLADDER    INTRAVASCULAR PRESSURE WIRE/FFR STUDY N/A 12/18/2019   Procedure: INTRAVASCULAR PRESSURE WIRE/FFR STUDY;  Surgeon: Jettie Booze, MD;  Location: [place] [facility];  Service: Cardiovascular;  Laterality: N/A;   RIGHT/LEFT HEART CATH AND CORONARY ANGIOGRAPHY N/A 12/18/2019   Procedure: RIGHT/LEFT HEART CATH AND CORONARY ANGIOGRAPHY;  Surgeon: Jettie Booze, MD;  Location: [place] [facility];  Service: Cardiovascular;  Laterality: N/A;   TONSILLECTOMY     TOTAL HIP ARTHROPLASTY Right 04/14/2013   Procedure: TOTAL HIP ARTHROPLASTY ANTERIOR APPROACH;  Surgeon: Hessie Dibble, MD;  Location: [place];  Service: Orthopedics;  Laterality: Right;    Current Medications: Current Meds  Medication Sig   aspirin EC 81 MG tablet Take 1 tablet (81 mg total) by mouth daily. Swallow whole.   atorvastatin (LIPITOR) 40 MG tablet Take 40 mg by mouth daily.   escitalopram (LEXAPRO) 20 MG tablet Take 20 mg by mouth daily.   Magnesium Oxide 250 MG TABS Take 250 mg by mouth at bedtime.   nitroGLYCERIN (NITROSTAT) 0.4 MG SL tablet PLACE 1 TABLET (0.4 MG TOTAL) UNDER THE TONGUE EVERY FIVE MINUTES AS NEEDED FOR CHEST PAIN.   sacubitril-valsartan (ENTRESTO) 24-26 MG Take 1 tablet by mouth 2 (two) times daily.   TRULICITY 3.15 QM/0.8QP SOPN SMARTSIG:0.5 Milliliter(s) SUB-Q Once a Week  vitamin B-12 (CYANOCOBALAMIN) 500 MCG tablet 1 tablet   Vitamin D3 (VITAMIN D) 25 MCG tablet 1 tablet     Allergies:   Lisinopril, Adhesive [tape], and Silicone   Social History   Socioeconomic History   Marital status: Married    Spouse name: Not on file   Number of children: Not on file   Years of education: Not on file   Highest education level: Not on file  Occupational History   Not on file  Tobacco Use   Smoking status: Never   Smokeless tobacco: Never  Substance and Sexual Activity   Alcohol use: Yes    Comment: glass of wine every now and then   Drug use: No   Sexual activity: Yes    Birth  control/protection: Other-see comments  Other Topics Concern   Not on file  Social History Narrative   Not on file   Social Determinants of Health   Financial Resource Strain: Not on file  Food Insecurity: Not on file  Transportation Needs: Not on file  Physical Activity: Not on file  Stress: Not on file  Social Connections: Not on file     Family History: The patient's Family history is unknown by patient.  ROS:   Please see the history of present illness.     All other systems reviewed and are negative.  EKGs/Labs/Other Studies Reviewed:    The following studies were reviewed today: ECHO 2022  1. Left ventricular ejection fraction, by estimation, is 40 to 45%. The  left ventricle has mildly decreased function. The left ventricle  demonstrates global hypokinesis. The left ventricular internal cavity size  was mildly dilated. Left ventricular  diastolic parameters are consistent with Grade I diastolic dysfunction  (impaired relaxation).   2. Right ventricular systolic function is normal. The right ventricular  size is normal.   3. The mitral valve is normal in structure. Mild mitral valve  regurgitation. No evidence of mitral stenosis.   4. The aortic valve has an indeterminant number of cusps. Aortic valve  regurgitation is not visualized. No aortic stenosis is present.   5. The inferior vena cava is normal in size with greater than 50%  respiratory variability, suggesting right atrial pressure of 3 mmHg.   Comparison(s): 12/02/2019 40-45%.   EKG:  EKG is ordered today.  The ekg ordered today demonstrates normal sinus rhythm 83 with poor R wave progression nonspecific T wave changes.  No change from prior. Prior EKG shows sinus rhythm with poor R wave progression   Recent Labs: 06/10/2021: ALT 22; BUN 14; Creatinine, Ser 1.11; Hemoglobin 13.6; Platelets 287; Potassium 4.1; Sodium 139  Recent Lipid Panel No results found for: CHOL, TRIG, HDL, CHOLHDL, VLDL, LDLCALC,  LDLDIRECT   Risk Assessment/Calculations:              Physical Exam:    VS:  BP 116/70   Pulse 83   Ht '5\' 3"'$  (1.6 m)   Wt 230 lb 6.4 oz (104.5 kg)   LMP 02/05/2014   BMI 40.81 kg/m     Wt Readings from Last 3 Encounters:  06/23/21 230 lb 6.4 oz (104.5 kg)  04/10/21 241 lb 12.8 oz (109.7 kg)  03/07/21 250 lb (113.4 kg)     GEN:  Well nourished, well developed in no acute distress HEENT: Normal NECK: No JVD; No carotid bruits LYMPHATICS: No lymphadenopathy CARDIAC: RRR, no murmurs, no rubs, gallops RESPIRATORY:  Clear to auscultation without rales, wheezing or rhonchi  ABDOMEN: Soft,  non-tender, non-distended MUSCULOSKELETAL:  No edema; No deformity  SKIN: Warm and dry NEUROLOGIC:  Alert and oriented x 3 PSYCHIATRIC:  Normal affect   ASSESSMENT:    1. Preop cardiovascular exam   2. Left ventricular dysfunction   3. Coronary artery disease involving native coronary artery of native heart without angina pectoris   4. Type 2 diabetes mellitus with complication, without long-term current use of insulin ([place])   5. Pure hypercholesterolemia    PLAN:    In order of problems listed above:  Preop cardiovascular exam She may proceed with gastric sleeve with low overall cardiac risk.  Most recent echocardiogram demonstrates ejection fraction of approximately 45%.  She is NYHA class I, having no symptoms.  No shortness of breath no orthopnea no chest pain.  Overall reassuring.  She is on Entresto and doing very well.  I would be comfortable with her holding her aspirin 81 mg prior to the procedure for 5 days.  Left ventricular dysfunction Ejection fraction 45%.  Entresto.  Doing well.  Blood pressure excellent.  No symptoms.  NYHA class I.  No changes made.  Stable.  CAD (coronary artery disease) Prior LAD stent in 2021.  If she must hold her aspirin prior to gastric sleeve procedure this is okay.  No anginal symptoms.  Type 2 diabetes mellitus with complication, without  long-term current use of insulin (HCC) Stable, doing well.  Hyperlipidemia Continue with atorvastatin 40 mg a day.  LDL 85.  Ultimate goal would be less than 70.      Medication Adjustments/Labs and Tests Ordered: Current medicines are reviewed at length with the patient today.  Concerns regarding medicines are outlined above.  Orders Placed This Encounter  Procedures   EKG 12-Lead   No orders of the defined types were placed in this encounter.   Patient Instructions  Medication Instructions:  The current medical regimen is effective;  continue present plan and medications.  *If you need a refill on your cardiac medications before your next appointment, please call your pharmacy*  OK for surgery.  Follow-Up: At [facility], you and your health needs are our priority.  As part of our continuing mission to provide you with exceptional heart care, we have created designated Provider Care Teams.  These Care Teams include your primary Cardiologist (physician) and Advanced Practice Providers (APPs -  Physician Assistants and Nurse Practitioners) who all work together to provide you with the care you need, when you need it.  We recommend signing up for the patient portal called "MyChart".  Sign up information is provided on this After Visit Summary.  MyChart is used to connect with patients for Virtual Visits (Telemedicine).  Patients are able to view lab/test results, encounter notes, upcoming appointments, etc.  Non-urgent messages can be sent to your provider as well.   To learn more about what you can do with MyChart, go to [email or web address].    Your next appointment:   6 month(s)  The format for your next appointment:   In Person  Provider:   Candee Furbish, MD {   Important Information About Sugar         Signed, Candee Furbish, MD  06/23/2021 11:58 AM    [place]

## 2021-06-28 DIAGNOSIS — M79672 Pain in left foot: Secondary | ICD-10-CM | POA: Diagnosis not present

## 2021-06-28 DIAGNOSIS — M25551 Pain in right hip: Secondary | ICD-10-CM | POA: Diagnosis not present

## 2021-06-28 DIAGNOSIS — M5451 Vertebrogenic low back pain: Secondary | ICD-10-CM | POA: Diagnosis not present

## 2021-06-30 DIAGNOSIS — Z713 Dietary counseling and surveillance: Secondary | ICD-10-CM | POA: Diagnosis not present

## 2021-06-30 DIAGNOSIS — Z9884 Bariatric surgery status: Secondary | ICD-10-CM | POA: Diagnosis not present

## 2021-06-30 DIAGNOSIS — E669 Obesity, unspecified: Secondary | ICD-10-CM | POA: Diagnosis not present

## 2021-06-30 DIAGNOSIS — R5383 Other fatigue: Secondary | ICD-10-CM | POA: Diagnosis not present

## 2021-07-07 DIAGNOSIS — R198 Other specified symptoms and signs involving the digestive system and abdomen: Secondary | ICD-10-CM | POA: Diagnosis not present

## 2021-07-11 DIAGNOSIS — M47816 Spondylosis without myelopathy or radiculopathy, lumbar region: Secondary | ICD-10-CM | POA: Diagnosis not present

## 2021-07-13 ENCOUNTER — Ambulatory Visit: Payer: BC Managed Care – PPO | Admitting: Dietician

## 2021-07-13 ENCOUNTER — Ambulatory Visit (HOSPITAL_COMMUNITY)
Admission: RE | Admit: 2021-07-13 | Discharge: 2021-07-13 | Disposition: A | Discharge status: Home / Self Care | Payer: BC Managed Care – PPO | Source: Ambulatory Visit | Attending: Surgery | Admitting: Surgery

## 2021-07-13 ENCOUNTER — Other Ambulatory Visit (HOSPITAL_COMMUNITY): Payer: Self-pay | Admitting: Surgery

## 2021-07-13 DIAGNOSIS — Z01818 Encounter for other preprocedural examination: Secondary | ICD-10-CM | POA: Diagnosis not present

## 2021-07-19 ENCOUNTER — Encounter: Payer: Self-pay | Admitting: Cardiology

## 2021-07-27 DIAGNOSIS — M47816 Spondylosis without myelopathy or radiculopathy, lumbar region: Secondary | ICD-10-CM | POA: Diagnosis not present

## 2021-08-01 ENCOUNTER — Encounter: Payer: BC Managed Care – PPO | Attending: Surgery | Admitting: Dietician

## 2021-08-01 ENCOUNTER — Encounter: Payer: Self-pay | Admitting: Dietician

## 2021-08-01 DIAGNOSIS — N1831 Chronic kidney disease, stage 3a: Secondary | ICD-10-CM | POA: Diagnosis not present

## 2021-08-01 DIAGNOSIS — E118 Type 2 diabetes mellitus with unspecified complications: Secondary | ICD-10-CM | POA: Insufficient documentation

## 2021-08-01 DIAGNOSIS — E669 Obesity, unspecified: Secondary | ICD-10-CM | POA: Insufficient documentation

## 2021-08-01 DIAGNOSIS — E119 Type 2 diabetes mellitus without complications: Secondary | ICD-10-CM | POA: Diagnosis not present

## 2021-08-07 ENCOUNTER — Other Ambulatory Visit: Payer: Self-pay | Admitting: Family Medicine

## 2021-08-07 DIAGNOSIS — N6323 Unspecified lump in the left breast, lower outer quadrant: Secondary | ICD-10-CM

## 2021-08-07 DIAGNOSIS — N6313 Unspecified lump in the right breast, lower outer quadrant: Secondary | ICD-10-CM

## 2021-08-15 DIAGNOSIS — F5089 Other specified eating disorder: Secondary | ICD-10-CM | POA: Diagnosis not present

## 2021-08-24 ENCOUNTER — Encounter: Payer: Self-pay | Admitting: Cardiology

## 2021-08-24 ENCOUNTER — Other Ambulatory Visit: Payer: Self-pay | Admitting: Cardiology

## 2021-09-08 ENCOUNTER — Telehealth: Payer: Self-pay | Admitting: Cardiology

## 2021-09-08 NOTE — Telephone Encounter (Signed)
*  STAT* If patient is at the pharmacy, call can be transferred to refill team.   1. Which medications need to be refilled? (please list name of each medication and dose if known)   sacubitril-valsartan (ENTRESTO) 24-26 MG    sacubitril-valsartan (ENTRESTO) 24-26 MG    2. Which pharmacy/location (including street and city if local pharmacy) is medication to be sent to? [facility] [address], [place]  [phone]   3. Do they need a 30 day or 90 day supply?  6 days   Pt states she is in [place] and left medication home. She needs a 6 day supply send to above pharmacy. Please advise

## 2021-09-08 NOTE — Telephone Encounter (Signed)
Called pt's pharmacy as requested to give a verbal order over the phone and the pharmacist would not take it because they only take orders from doctors in [place]. I called the pt to inform her what the pharmacist had said and pt verbalized understanding.

## 2021-09-21 ENCOUNTER — Ambulatory Visit: Payer: Self-pay | Admitting: Surgery

## 2021-09-21 NOTE — H&P (Signed)
Traci Roy [id number]    Referring Provider:  Self     Subjective    Chief Complaint: weight loss- pre op visit       History of Present Illness: Follow-up regarding surgical treatment of morbid obesity.  She denies any changes in her health.  She has completed the preoperative work-up without any barriers identified.  Chest x-ray and upper GI were normal's, no hiatal hernia.  She has been cleared by cardiology, and is to hold aspirin for 5 days preop. She has several insightful questions to discuss today but in general is anxiously awaiting surgery next month.   61 year old woman with a history of arthritis, chronic back pain from a previous back injury, congestive heart failure, depression, diabetes, GERD (occasional), history of duodenal ulcer, history of shingles, hypertension, restless legs who presents for consultation regarding bariatric surgery.  She initially saw my partner Dr. Kieth Brightly in 2016 and completed the pathway but had some reservations about proceeding with surgery.  She did have a hiatal hernia on her upper GI but all of her other preop work-up was normal.  In the interim she has had some health issues as listed below. She is interested in sleeve gastrectomy as she is hoping to improve her overall health, and avoid further worsening of current comorbidities.  She has successfully lost about 20 pounds just by behavioral changes recently and has started tracking her calories.   Previous abdominal surgery includes C-section.   She had heart catheterization in November 2021 with LAD stent placement after presenting to the emergency room with chest pain.  Her EF at that time was 40 to 45%.  CT at that time showed a trace right pleural effusion, hepatic steatosis and a 4 cm left hepatic cyst.  Her cardiologist is Dr. Marlou Porch, who she last saw in March of this year (.Marland KitchenMarland KitchenAt prior visit, she was quite concerned about her echo report as well as diagnosis of chronic systolic heart  failure.  She was concerned about her mortality dramatically increasing however she is NYHA class I typically.   She has had fatigue for quite some time.  Her memory has been not the best.  She called wanting to know if this was her heart medication.  She was encouraged by her pharmacy team to start walking.  Did not like swimming because it hurt her back at the [facility].   She was offered to switch Entresto to another ARB if she really thought it was contributing to her fatigue.  She decided to continue Entresto.  I also talked about GLP-1, cardiovascular benefit as well as weight loss diabetes control.  She was resistant to multiple medications.   Resources were provided on good nutrition, Zoe podcast for instance and she was quite honest that she probably was not going to listen to a podcast to read a book or count anything.   She is worried about the ramifications of weight loss surgery.").   Most recent emergency room visit was earlier this month where she was diagnosed with gastritis after presenting with abdominal pain.  She has a history recently of persistent chronic diarrhea, transiently improved with Cipro.  She has been referred to a gastroenterologist to further evaluate this, but has not done so yet.   11/05/14: The patient is a 61 year old female who presents for a bariatric surgery evaluation.Associated symptoms include anxiety, poor self esteem, joint pains and back pain. Initial onset of obesity was in childhood. Initial presentation included inability to lose weight.  Patient's highest weight was 256 ___. Patient's lowest weight in adulthood was 149 ___. Disease complications include hypertension and osteoarthritis, while disease complications do not include thromboembolism, pulmonary hypertension, fatty liver, thrombophlebitis, coronary artery disease or peripheral vascular disease. Diets tried in the past include low calorie, low carbohydrate and low fat. Current diet includes well  balanced meals. 1-2 times per week. Limitations to weight loss do not include daily sugared beverages, breads and pastas or alcohol intake. Pertinent family history includes obesity. The patient is currently able to do activities of daily living without limitations. anxiety. The patient has heartburn (less than 1x/week, not on meds or OTCs )The patient denies dysphagia. Her pcp is Dr. Justin Mend who agrees with weight loss surgery to improve health 256lb/ bmi 44       Review of Systems: A complete review of systems was obtained from the patient.  I have reviewed this information and discussed as appropriate with the patient.  See HPI as well for other ROS.     Medical History: Past Medical History      Past Medical History:  Diagnosis Date   Anxiety     Arthritis     CHF (congestive heart failure) (CMS-HCC)     Chronic kidney disease     Diabetes mellitus without complication (CMS-HCC)     Hyperlipidemia     Hypertension             Patient Active Problem List  Diagnosis   Acute respiratory failure with hypoxia (CMS-HCC)   Acute stress reaction   AKI (acute kidney injury) (CMS-HCC)   Angina pectoris (CMS-HCC)   Anxiety   Body mass index (BMI) 40.0-44.9, adult (CMS-HCC)   Atherosclerotic heart disease of native coronary artery without angina pectoris   Chronic fatigue syndrome   Chronic kidney disease, stage 3 unspecified (CMS-HCC)   Daytime somnolence   Degenerative joint disease (DJD) of hip   Essential hypertension   Hyperglycemia   Hyperlipidemia   Hypokalemia   Left ventricular dysfunction   Liver cyst   Major depressive disorder, single episode, unspecified   Moderate recurrent major depression (CMS-HCC)   Morbid obesity (CMS-HCC)   Morton's neuroma of left foot   Noninfective gastroenteritis and colitis, unspecified   Other specified conditions associated with female genital organs and menstrual cycle   Pneumonia due to COVID-19 virus   Sciatica, left side   Type  2 diabetes mellitus with other specified complication (CMS-HCC)      Past Surgical History       Past Surgical History:  Procedure Laterality Date   Bladder Stretching   1970   TONSILLECTOMY   1971   CESAREAN SECTION   6144   TRANSUMBILICAL AUGMENTATION MAMMAPLASTY Bilateral 2005   Hip Replacement Surgery    2015   COLONOSCOPY   2016   Stent Placement Surgery    2021        Allergies       Allergies  Allergen Reactions   Lisinopril Cough   Adhesive Tape-Silicones Itching and Rash      Itching and breaks out              Current Outpatient Medications on File Prior to Visit  Medication Sig Dispense Refill   aspirin 81 MG EC tablet 1 tablet       atorvastatin (LIPITOR) 40 MG tablet 1 tablet       escitalopram oxalate (LEXAPRO) 20 MG tablet Take 1 tablet by mouth once daily  magnesium oxide 250 mg magnesium Tab Take by mouth       multivitamin capsule 1 tablet       sacubitriL-valsartan (ENTRESTO) 24-26 mg tablet 1 tablet       TRULICITY 0.31 RX/4.5 mL subcutaneous pen injector AS DIRECTED SUBCUTANEOUS 0.5 ML ONCE A WEEK 30 DAYS        No current facility-administered medications on file prior to visit.      Family History  History reviewed. No pertinent family history.      Social History       Tobacco Use  Smoking Status Never  Smokeless Tobacco Never      Social History  Social History        Socioeconomic History   Marital status: Married  Tobacco Use   Smoking status: Never   Smokeless tobacco: Never  Substance and Sexual Activity   Alcohol use: Not Currently   Drug use: Never        Objective:         Vitals:    09/21/21 0951  BP: (!) 142/72  Pulse: 92  Weight: (!) 102.5 kg (226 lb)  Height: 160 cm ('5\' 3"'$ )    Body mass index is 40.03 kg/m.   Alert, well-appearing Unlabored respirations   Assessment and Plan:   She remains a candidate for sleeve gastrectomy.  We have previously discussed the surgery in detail including  risks, benefits, alternatives and anticipated postop recovery.  All of her questions were answered today to her satisfaction.  We again discussed the importance of lifelong behavioral changes to combat the chronic and relapsing disease which is obesity.  We will plan to proceed with surgery as scheduled next month.  Hold aspirin 5 days preop.   Neeva Trew Raquel James, MD

## 2021-09-22 ENCOUNTER — Other Ambulatory Visit: Payer: BC Managed Care – PPO

## 2021-09-25 ENCOUNTER — Ambulatory Visit: Payer: BC Managed Care – PPO

## 2021-10-02 ENCOUNTER — Encounter: Payer: BC Managed Care – PPO | Attending: Surgery | Admitting: Skilled Nursing Facility1

## 2021-10-02 ENCOUNTER — Encounter: Payer: Self-pay | Admitting: Skilled Nursing Facility1

## 2021-10-02 DIAGNOSIS — E118 Type 2 diabetes mellitus with unspecified complications: Secondary | ICD-10-CM | POA: Insufficient documentation

## 2021-10-02 NOTE — Progress Notes (Signed)
Pre-Operative Nutrition Class:    Patient was seen on 10/02/2021 for Pre-Operative Bariatric Surgery Education at the [facility].    Surgery date: sleeve Surgery type: 10/30/2021 Start weight at [facility]: 225 Weight today: 229  Samples given per MNT protocol. Patient educated on appropriate usage:  Bariatric Advantage Multivitamin Lot # [id number] Exp: 08/24   Bariatric Advantage Calcium  Lot # [id number] Exp: 05/17/2022   Protein Shake Lot # [id number] / [id number] Exp: 05 Nov 2021 /  05 Feb 2022 The following the learning objectives were met by the patient during this course: Identify Pre-Op Dietary Goals and will begin 2 weeks pre-operatively Identify appropriate sources of fluids and proteins  State protein recommendations and appropriate sources pre and post-operatively Identify Post-Operative Dietary Goals and will follow for 2 weeks post-operatively Identify appropriate multivitamin and calcium sources Describe the need for physical activity post-operatively and will follow MD recommendations State when to call healthcare provider regarding medication questions or post-operative complications When having a diagnosis of diabetes understanding hypoglycemia symptoms and the inclusion of 1 complex carbohydrate per meal  Handouts given during class include: Pre-Op Bariatric Surgery Diet Handout Protein Shake Handout Post-Op Bariatric Surgery Nutrition Handout BELT Program Information Flyer Support Group Information Flyer [facility] Bariatric Supplements Price List  Follow-Up Plan: Patient will follow-up at [facility] 2 weeks post operatively for diet advancement per MD.  

## 2021-10-08 NOTE — Progress Notes (Signed)
COVID Vaccine received:  '[]'$  No '[x]'$  Yes Date of any COVID positive Test in last 90 days:  none  PCP - Jonathon Jordan, MD Cardiologist - Candee Furbish, MD  Chest x-ray - 07-13-21  Epic EKG -  06-23-21  Epic Stress Test - n/a ECHO - 10-28-20  Epic Cardiac Cath - 12-18-2019  [facility] w/ stent  Dr. Irish Lack  Pacemaker/ICD device     '[x]'$  N/A Spinal Cord Stimulator:'[x]'$  No '[]'$  Yes     Other Implants: n/a  Bowel Prep - done  History of Sleep Apnea? '[x]'$  No '[]'$  Yes   Sleep Study Date:   CPAP used?- '[x]'$  No '[]'$  Yes  (Instruct to bring their mask & Tubing)  Does the patient monitor blood sugar? '[]'$  No '[x]'$  Yes  '[]'$  N/A Does patient have a [employer] or Dexacom? '[x]'$  No '[]'$  Yes   Fasting Blood Sugar Ranges- 110-140 Checks Blood Sugar __3___ times a month  Blood Thinner Instructions:  n/a Aspirin Instructions:   ASA 81 mg   Hold 5 days prior to surgery Last Dose:  ERAS Protocol Ordered: '[]'$  No  '[x]'$  Yes PRE-SURGERY '[]'$  ENSURE  '[x]'$  G2   Comments:Since going to a nutritionist, patient has not been on Trulicity injections for Pre-DM, just diet management only.  Activity level: Patient can climb a flight of stairs without difficulty; '[x]'$  No CP  '[x]'$  No SOB   Anesthesia review: CHF, CAD-DES, angina, Fatty liver, DM2, HTN  Patient denies shortness of breath, fever, cough and chest pain at PAT appointment.  Patient verbalized understanding and agreement to the Pre-Surgical Instructions that were given to them at this PAT appointment. Patient was also educated of the need to review these PAT instructions again prior to his/her surgery.I reviewed the appropriate phone numbers to call if they have any and questions or concerns.

## 2021-10-08 NOTE — Patient Instructions (Signed)
DUE TO SPACE LIMITATIONS, ONLY TWO VISITORS  (aged 61 and older) ARE ALLOWED TO COME WITH YOU AND STAY IN THE WAITING ROOM DURING YOUR PRE OP AND PROCEDURE.   **NO VISITORS ARE ALLOWED IN THE SHORT STAY AREA OR RECOVERY ROOM!!**  IF YOU WILL BE ADMITTED INTO THE HOSPITAL YOU ARE ALLOWED ONLY FOUR SUPPORT PEOPLE DURING VISITATION HOURS (7 AM -8PM)   The support person(s) must pass our screening, and use Hand sanitizing gel. Visitors GUEST BADGE MUST BE WORN VISIBLY  One adult visitor may remain with you overnight and MUST be in the room by 8 P.M.   You are not required to quarantine at this time prior to your surgery. However, you must do this: Hand Hygiene often Do NOT share personal items Notify your provider if you are in close contact with someone who has COVID or you develop fever 100.4 or greater, new onset of sneezing, cough, sore throat, shortness of breath or body aches.       Your procedure is scheduled on:  Tuesday October 24, 2021  Report to [facility] Main Entrance.  Report to admitting at:  05:15   AM  +++++Call this number if you have any questions or problems the morning of surgery [phone]  Do not eat food :After Midnight the night prior to your surgery/procedure.  After Midnight you may have the following liquids until   04:30 AM DAY OF SURGERY  Clear Liquid Diet Water Black Coffee (sugar ok, NO MILK/CREAM OR CREAMERS)  Tea (sugar ok, NO MILK/CREAM OR CREAMERS) regular and decaf                             Plain Jell-O (NO RED)                                           Fruit ices (not with fruit pulp, NO RED)                                     Popsicles (NO RED)                                                                  Juice: apple, WHITE grape, WHITE cranberry Sports drinks like Gatorade (NO RED)                     The day of surgery:  Drink ONE (1) Pre-Surgery  G2 at   04:30     AM the morning of surgery. Drink in one sitting. Do  not sip.  This drink was given to you during your hospital pre-op appointment visit. Nothing else to drink after completing the Pre-Surgery G2.    FOLLOW BOWEL PREP AND ANY ADDITIONAL PRE OP INSTRUCTIONS YOU RECEIVED FROM YOUR SURGEON'S OFFICE!!!    Oral Hygiene is also important to reduce your risk of infection.        Remember - BRUSH YOUR TEETH THE MORNING OF SURGERY WITH YOUR REGULAR TOOTHPASTE   Take ONLY these  medicines the morning of surgery with A SIP OF WATER: Escitalopram (Lexapro) and you may take Tylenol if needed for pain                   You may not have any metal on your body including hair pins, jewelry, and body piercing  Do not wear make-up, lotions, powders, perfumes, or deodorant  Do not wear nail polish including gel and S&S, artificial / acrylic nails, or any other type of covering on natural nails including finger and toenails. If you have artificial nails, gel coating, etc., that needs to be removed by a nail salon, Please have this removed prior to surgery. Not doing so may mean that your surgery could be cancelled or delayed if the Surgeon or anesthesia staff feels like they are unable to monitor you safely.   Do not shave 48 hours prior to surgery to avoid nicks in your skin which may contribute to postoperative infections.   You may bring a small overnight bag with you on the day of surgery, only pack items that are not valuable .[facility] IS NOT RESPONSIBLE   FOR VALUABLES THAT ARE LOST OR STOLEN.   DO NOT [place]. PHARMACY WILL DISPENSE MEDICATIONS LISTED ON YOUR MEDICATION LIST TO YOU DURING YOUR ADMISSION [place]!   Special Instructions: Bring a copy of your healthcare power of attorney and living will documents the day of surgery, if you wish to have them scanned into your [facility] Records- EPIC  Please read over the following fact sheets you were given: IF YOU HAVE QUESTIONS ABOUT YOUR PRE-OP  INSTRUCTIONS, PLEASE CALL [phone]  ([place])   [facility] - Preparing for Surgery Before surgery, you can play an important role.  Because skin is not sterile, your skin needs to be as free of germs as possible.  You can reduce the number of germs on your skin by washing with CHG (chlorahexidine gluconate) soap before surgery.  CHG is an antiseptic cleaner which kills germs and bonds with the skin to continue killing germs even after washing. Please DO NOT use if you have an allergy to CHG or antibacterial soaps.  If your skin becomes reddened/irritated stop using the CHG and inform your nurse when you arrive at Short Stay. Do not shave (including legs and underarms) for at least 48 hours prior to the first CHG shower.  You may shave your face/neck.  Please follow these instructions carefully:  1.  Shower with CHG Soap the night before surgery and the  morning of surgery.  2.  If you choose to wash your hair, wash your hair first as usual with your normal  shampoo.  3.  After you shampoo, rinse your hair and body thoroughly to remove the shampoo.                             4.  Use CHG as you would any other liquid soap.  You can apply chg directly to the skin and wash.  Gently with a scrungie or clean washcloth.  5.  Apply the CHG Soap to your body ONLY FROM THE NECK DOWN.   Do not use on face/ open                           Wound or open sores. Avoid contact with eyes, ears mouth and  genitals (private parts).                       Wash face,  Genitals (private parts) with your normal soap.             6.  Wash thoroughly, paying special attention to the area where your  surgery  will be performed.  7.  Thoroughly rinse your body with warm water from the neck down.  8.  DO NOT shower/wash with your normal soap after using and rinsing off the CHG Soap.            9.  Pat yourself dry with a clean towel.            10.  Wear clean pajamas.            11.  Place clean sheets on your bed the  night of your first shower and do not  sleep with pets.  ON THE DAY OF SURGERY : Do not apply any lotions/deodorants the morning of surgery.  Please wear clean clothes to the hospital/surgery center.    FAILURE TO FOLLOW THESE INSTRUCTIONS MAY RESULT IN THE CANCELLATION OF YOUR SURGERY  PATIENT SIGNATURE_________________________________  NURSE SIGNATURE__________________________________  ________________________________________________________________________         Adam Phenix    An incentive spirometer is a tool that can help keep your lungs clear and active. This tool measures how well you are filling your lungs with each breath. Taking long deep breaths may help reverse or decrease the chance of developing breathing (pulmonary) problems (especially infection) following: A long period of time when you are unable to move or be active. BEFORE THE PROCEDURE  If the spirometer includes an indicator to show your best effort, your nurse or respiratory therapist will set it to a desired goal. If possible, sit up straight or lean slightly forward. Try not to slouch. Hold the incentive spirometer in an upright position. INSTRUCTIONS FOR USE  Sit on the edge of your bed if possible, or sit up as far as you can in bed or on a chair. Hold the incentive spirometer in an upright position. Breathe out normally. Place the mouthpiece in your mouth and seal your lips tightly around it. Breathe in slowly and as deeply as possible, raising the piston or the ball toward the top of the column. Hold your breath for 3-5 seconds or for as long as possible. Allow the piston or ball to fall to the bottom of the column. Remove the mouthpiece from your mouth and breathe out normally. Rest for a few seconds and repeat Steps 1 through 7 at least 10 times every 1-2 hours when you are awake. Take your time and take a few normal breaths between deep breaths. The spirometer may include an indicator  to show your best effort. Use the indicator as a goal to work toward during each repetition. After each set of 10 deep breaths, practice coughing to be sure your lungs are clear. If you have an incision (the cut made at the time of surgery), support your incision when coughing by placing a pillow or rolled up towels firmly against it. Once you are able to get out of bed, walk around indoors and cough well. You may stop using the incentive spirometer when instructed by your caregiver.  RISKS AND COMPLICATIONS Take your time so you do not get dizzy or light-headed. If you are in pain, you may need to take or ask  for pain medication before doing incentive spirometry. It is harder to take a deep breath if you are having pain. AFTER USE Rest and breathe slowly and easily. It can be helpful to keep track of a log of your progress. Your caregiver can provide you with a simple table to help with this. If you are using the spirometer at home, follow these instructions: [place] IF:  You are having difficultly using the spirometer. You have trouble using the spirometer as often as instructed. Your pain medication is not giving enough relief while using the spirometer. You develop fever of 100.5 F (38.1 C) or higher.                                                                                                    SEEK IMMEDIATE MEDICAL CARE IF:  You cough up bloody sputum that had not been present before. You develop fever of 102 F (38.9 C) or greater. You develop worsening pain at or near the incision site. MAKE SURE YOU:  Understand these instructions. Will watch your condition. Will get help right away if you are not doing well or get worse. Document Released: 06/04/2006 Document Revised: 04/16/2011 Document Reviewed: 08/05/2006 [facility] Patient Information 2014 [place], [place].

## 2021-10-09 ENCOUNTER — Encounter: Payer: Self-pay | Admitting: Cardiology

## 2021-10-10 ENCOUNTER — Telehealth: Payer: Self-pay | Admitting: Skilled Nursing Facility1

## 2021-10-10 NOTE — Telephone Encounter (Signed)
Returned pts call.  Answered pts question to her satisfaction.

## 2021-10-11 ENCOUNTER — Other Ambulatory Visit: Payer: Self-pay

## 2021-10-11 ENCOUNTER — Encounter (HOSPITAL_COMMUNITY): Payer: Self-pay

## 2021-10-11 ENCOUNTER — Encounter (HOSPITAL_COMMUNITY)
Admission: RE | Admit: 2021-10-11 | Discharge: 2021-10-11 | Disposition: A | Discharge status: Home / Self Care | Payer: BC Managed Care – PPO | Source: Ambulatory Visit | Attending: Surgery | Admitting: Surgery

## 2021-10-11 VITALS — BP 136/72 | HR 70 | Temp 98.1°F | Resp 22 | Ht 63.0 in | Wt 224.0 lb

## 2021-10-11 DIAGNOSIS — I110 Hypertensive heart disease with heart failure: Secondary | ICD-10-CM | POA: Insufficient documentation

## 2021-10-11 DIAGNOSIS — Z6839 Body mass index (BMI) 39.0-39.9, adult: Secondary | ICD-10-CM | POA: Diagnosis not present

## 2021-10-11 DIAGNOSIS — E119 Type 2 diabetes mellitus without complications: Secondary | ICD-10-CM | POA: Insufficient documentation

## 2021-10-11 DIAGNOSIS — Z01812 Encounter for preprocedural laboratory examination: Secondary | ICD-10-CM | POA: Insufficient documentation

## 2021-10-11 DIAGNOSIS — I509 Heart failure, unspecified: Secondary | ICD-10-CM | POA: Diagnosis not present

## 2021-10-11 DIAGNOSIS — I2510 Atherosclerotic heart disease of native coronary artery without angina pectoris: Secondary | ICD-10-CM | POA: Insufficient documentation

## 2021-10-11 HISTORY — DX: Malignant (primary) neoplasm, unspecified: C80.1

## 2021-10-11 HISTORY — DX: Anxiety disorder, unspecified: F41.9

## 2021-10-11 LAB — CBC WITH DIFFERENTIAL/PLATELET
Abs Immature Granulocytes: 0.02 10*3/uL (ref 0.00–0.07)
Basophils Absolute: 0 10*3/uL (ref 0.0–0.1)
Basophils Relative: 1 %
Eosinophils Absolute: 0.1 10*3/uL (ref 0.0–0.5)
Eosinophils Relative: 2 %
HCT: 45 % (ref 36.0–46.0)
Hemoglobin: 14.6 g/dL (ref 12.0–15.0)
Immature Granulocytes: 0 %
Lymphocytes Relative: 39 %
Lymphs Abs: 3.1 10*3/uL (ref 0.7–4.0)
MCH: 30.6 pg (ref 26.0–34.0)
MCHC: 32.4 g/dL (ref 30.0–36.0)
MCV: 94.3 fL (ref 80.0–100.0)
Monocytes Absolute: 0.5 10*3/uL (ref 0.1–1.0)
Monocytes Relative: 6 %
Neutro Abs: 4.1 10*3/uL (ref 1.7–7.7)
Neutrophils Relative %: 52 %
Platelets: 249 10*3/uL (ref 150–400)
RBC: 4.77 MIL/uL (ref 3.87–5.11)
RDW: 12.3 % (ref 11.5–15.5)
WBC: 7.8 10*3/uL (ref 4.0–10.5)
nRBC: 0 % (ref 0.0–0.2)

## 2021-10-11 LAB — HEMOGLOBIN A1C
Hgb A1c MFr Bld: 5.8 % — ABNORMAL HIGH (ref 4.8–5.6)
Mean Plasma Glucose: 119.76 mg/dL

## 2021-10-11 LAB — COMPREHENSIVE METABOLIC PANEL
ALT: 21 U/L (ref 0–44)
AST: 32 U/L (ref 15–41)
Albumin: 3.9 g/dL (ref 3.5–5.0)
Alkaline Phosphatase: 75 U/L (ref 38–126)
Anion gap: 7 (ref 5–15)
BUN: 27 mg/dL — ABNORMAL HIGH (ref 8–23)
CO2: 27 mmol/L (ref 22–32)
Calcium: 9.3 mg/dL (ref 8.9–10.3)
Chloride: 107 mmol/L (ref 98–111)
Creatinine, Ser: 0.9 mg/dL (ref 0.44–1.00)
GFR, Estimated: >60 mL/min (ref >60)
Glucose, Bld: 100 mg/dL — ABNORMAL HIGH (ref 70–99)
Potassium: 4.3 mmol/L (ref 3.5–5.1)
Sodium: 141 mmol/L (ref 135–145)
Total Bilirubin: 0.7 mg/dL (ref 0.3–1.2)
Total Protein: 6.9 g/dL (ref 6.5–8.1)

## 2021-10-11 LAB — TYPE AND SCREEN
ABO/RH(D): O POS
Antibody Screen: NEGATIVE

## 2021-10-11 LAB — GLUCOSE, CAPILLARY: Glucose-Capillary: 99 mg/dL (ref 70–99)

## 2021-10-12 NOTE — Anesthesia Preprocedure Evaluation (Addendum)
Anesthesia Evaluation  Patient identified by MRN, date of birth, ID band Patient awake    Reviewed: Allergy & Precautions, NPO status , Patient's Chart, lab work & pertinent test results  Airway Mallampati: II  TM Distance: >3 FB Neck ROM: Full    Dental no notable dental hx.    Pulmonary neg pulmonary ROS,    Pulmonary exam normal breath sounds clear to auscultation       Cardiovascular hypertension, Pt. on medications + CAD, + Cardiac Stents and +CHF  Normal cardiovascular exam Rhythm:Regular Rate:Normal     Neuro/Psych Anxiety Depression negative neurological ROS  negative psych ROS   GI/Hepatic Neg liver ROS, GERD  ,  Endo/Other  negative endocrine ROSdiabetes, Type 2  Renal/GU negative Renal ROS  negative genitourinary   Musculoskeletal  (+) Arthritis , Osteoarthritis,    Abdominal (+) + obese,   Peds negative pediatric ROS (+)  Hematology negative hematology ROS (+)   Anesthesia Other Findings   Reproductive/Obstetrics negative OB ROS                            Anesthesia Physical Anesthesia Plan  ASA: 3  Anesthesia Plan: General   Post-op Pain Management: Dilaudid IV, Gabapentin PO (pre-op)* and Tylenol PO (pre-op)*   Induction: Intravenous  PONV Risk Score and Plan: 3 and Ondansetron, Dexamethasone, Midazolam and Treatment may vary due to age or medical condition  Airway Management Planned: Oral ETT  Additional Equipment:   Intra-op Plan:   Post-operative Plan: Extubation in OR  Informed Consent: I have reviewed the patients History and Physical, chart, labs and discussed the procedure including the risks, benefits and alternatives for the proposed anesthesia with the patient or authorized representative who has indicated his/her understanding and acceptance.     Dental advisory given  Plan Discussed with: CRNA  Anesthesia Plan Comments: (See PAT note 10/11/2021)        Anesthesia Quick Evaluation

## 2021-10-12 NOTE — Progress Notes (Signed)
Anesthesia Chart Review   Case: [id number] Date/Time: 10/24/21 0715   Procedures:      LAPAROSCOPIC SLEEVE GASTRECTOMY     UPPER GI ENDOSCOPY   Anesthesia type: General   Pre-op diagnosis: MORBID OBESITY   Location: [facility] ROOM 01 / [facility]   Surgeons: Clovis Riley, MD       DISCUSSION:61 y.o. never smoker with h/o HTN, DM II, CHF, CAD (LAD Stent 12/2019), morbid obesity scheduled for above procedure 10/24/2021 with Dr. Romana Juniper.   Pt last seen by cardiology 06/23/2021. Per OV note, "Preop cardiovascular exam She may proceed with gastric sleeve with low overall cardiac risk.  Most recent echocardiogram demonstrates ejection fraction of approximately 45%.  She is NYHA class I, having no symptoms.  No shortness of breath no orthopnea no chest pain.  Overall reassuring.  She is on Entresto and doing very well.  I would be comfortable with her holding her aspirin 81 mg prior to the procedure for 5 days."  Anticipate pt can proceed with planned procedure barring acute status change.   VS: BP 136/72 Comment: right arm sitting  Pulse 70   Temp 36.7 C   Resp (!) 22   Ht '5\' 3"'$  (1.6 m)   Wt 101.6 kg   LMP 02/05/2014   SpO2 100%   BMI 39.68 kg/m   PROVIDERS: Jonathon Jordan, MD is PCP   Cardiologist - Candee Furbish, MD LABS: Labs reviewed: Acceptable for surgery. (all labs ordered are listed, but only abnormal results are displayed)  Labs Reviewed  COMPREHENSIVE METABOLIC PANEL - Abnormal; Notable for the following components:      Result Value   Glucose, Bld 100 (*)    BUN 27 (*)    All other components within normal limits  HEMOGLOBIN A1C - Abnormal; Notable for the following components:   Hgb A1c MFr Bld 5.8 (*)    All other components within normal limits  CBC WITH DIFFERENTIAL/PLATELET  GLUCOSE, CAPILLARY  TYPE AND SCREEN     IMAGES:   EKG:   CV: Echo 10/28/2020 1. Left ventricular ejection fraction, by estimation, is 40 to 45%. The  left ventricle has mildly  decreased function. The left ventricle  demonstrates global hypokinesis. The left ventricular internal cavity size  was mildly dilated. Left ventricular  diastolic parameters are consistent with Grade I diastolic dysfunction  (impaired relaxation).   2. Right ventricular systolic function is normal. The right ventricular  size is normal.   3. The mitral valve is normal in structure. Mild mitral valve  regurgitation. No evidence of mitral stenosis.   4. The aortic valve has an indeterminant number of cusps. Aortic valve  regurgitation is not visualized. No aortic stenosis is present.   5. The inferior vena cava is normal in size with greater than 50%  respiratory variability, suggesting right atrial pressure of 3 mmHg.  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   CHF (congestive heart failure) ([place])    Depression    takes Lexapro daily   Diabetes mellitus without complication ([place])    Dizziness    occasionally and no meds   Flu    end of Dec 2014   GERD (gastroesophageal reflux disease)    occasionally and no meds required   History of bronchitis    last time many yrs ago   History of duodenal ulcer  History of shingles 3-5 YRS AGO   Hypertension    takes Losartan and HCTZ daily   Joint pain    Restless legs     Past Surgical History:  Procedure Laterality Date   BREAST ENHANCEMENT SURGERY Bilateral    CESAREAN SECTION     X 1   COLONOSCOPY     CORONARY STENT INTERVENTION N/A 12/18/2019   Procedure: CORONARY STENT INTERVENTION;  Surgeon: Jettie Booze, MD;  Location: [facility];  Service: Cardiovascular;  Laterality: N/A;   enlarged bladder as a child     HAD SURGERY TO ENLARGE BLADDER   INTRAVASCULAR PRESSURE WIRE/FFR STUDY N/A 12/18/2019   Procedure: INTRAVASCULAR PRESSURE WIRE/FFR STUDY;  Surgeon: Jettie Booze, MD;  Location: [facility];  Service: Cardiovascular;   Laterality: N/A;   RIGHT/LEFT HEART CATH AND CORONARY ANGIOGRAPHY N/A 12/18/2019   Procedure: RIGHT/LEFT HEART CATH AND CORONARY ANGIOGRAPHY;  Surgeon: Jettie Booze, MD;  Location: [facility];  Service: Cardiovascular;  Laterality: N/A;   TONSILLECTOMY     TOTAL HIP ARTHROPLASTY Right 04/14/2013   Procedure: TOTAL HIP ARTHROPLASTY ANTERIOR APPROACH;  Surgeon: Hessie Dibble, MD;  Location: [facility];  Service: Orthopedics;  Laterality: Right;    MEDICATIONS:  acetaminophen (TYLENOL) 500 MG tablet   aspirin EC 81 MG tablet   escitalopram (LEXAPRO) 20 MG tablet   famotidine (PEPCID) 20 MG tablet   Magnesium Oxide 250 MG TABS   nitroGLYCERIN (NITROSTAT) 0.4 MG SL tablet   omeprazole (PRILOSEC) 20 MG capsule   sacubitril-valsartan (ENTRESTO) 24-26 MG   No current facility-administered medications for this encounter.    Konrad Felix Ward, PA-C [facility]-Surgical Testing [phone]

## 2021-10-14 DIAGNOSIS — L03221 Cellulitis of neck: Secondary | ICD-10-CM | POA: Diagnosis not present

## 2021-10-17 ENCOUNTER — Other Ambulatory Visit: Payer: BC Managed Care – PPO

## 2021-10-24 ENCOUNTER — Other Ambulatory Visit (HOSPITAL_COMMUNITY): Payer: Self-pay

## 2021-10-24 ENCOUNTER — Encounter (HOSPITAL_COMMUNITY)
Admission: RE | Disposition: A | Discharge status: Home / Self Care | Payer: Self-pay | Source: Home / Self Care | Attending: Surgery

## 2021-10-24 ENCOUNTER — Encounter (HOSPITAL_COMMUNITY): Payer: Self-pay | Admitting: Surgery

## 2021-10-24 ENCOUNTER — Other Ambulatory Visit: Payer: Self-pay

## 2021-10-24 ENCOUNTER — Inpatient Hospital Stay (HOSPITAL_COMMUNITY): Payer: BC Managed Care – PPO | Admitting: Physician Assistant

## 2021-10-24 ENCOUNTER — Inpatient Hospital Stay (HOSPITAL_COMMUNITY): Payer: BC Managed Care – PPO | Admitting: Certified Registered"

## 2021-10-24 ENCOUNTER — Inpatient Hospital Stay (HOSPITAL_COMMUNITY)
Admission: RE | Admit: 2021-10-24 | Discharge: 2021-10-25 | DRG: 620 | Disposition: A | Discharge status: Home / Self Care | Payer: BC Managed Care – PPO | Attending: Surgery | Admitting: Surgery

## 2021-10-24 DIAGNOSIS — I110 Hypertensive heart disease with heart failure: Secondary | ICD-10-CM | POA: Diagnosis not present

## 2021-10-24 DIAGNOSIS — Z6841 Body Mass Index (BMI) 40.0 and over, adult: Secondary | ICD-10-CM | POA: Diagnosis not present

## 2021-10-24 DIAGNOSIS — F331 Major depressive disorder, recurrent, moderate: Secondary | ICD-10-CM | POA: Diagnosis present

## 2021-10-24 DIAGNOSIS — F419 Anxiety disorder, unspecified: Secondary | ICD-10-CM | POA: Diagnosis not present

## 2021-10-24 DIAGNOSIS — I130 Hypertensive heart and chronic kidney disease with heart failure and stage 1 through stage 4 chronic kidney disease, or unspecified chronic kidney disease: Secondary | ICD-10-CM | POA: Diagnosis not present

## 2021-10-24 DIAGNOSIS — N183 Chronic kidney disease, stage 3 unspecified: Secondary | ICD-10-CM | POA: Diagnosis not present

## 2021-10-24 DIAGNOSIS — Z6839 Body mass index (BMI) 39.0-39.9, adult: Secondary | ICD-10-CM | POA: Diagnosis not present

## 2021-10-24 DIAGNOSIS — E1122 Type 2 diabetes mellitus with diabetic chronic kidney disease: Secondary | ICD-10-CM | POA: Diagnosis present

## 2021-10-24 DIAGNOSIS — Z955 Presence of coronary angioplasty implant and graft: Secondary | ICD-10-CM

## 2021-10-24 DIAGNOSIS — I2510 Atherosclerotic heart disease of native coronary artery without angina pectoris: Secondary | ICD-10-CM | POA: Diagnosis present

## 2021-10-24 DIAGNOSIS — E785 Hyperlipidemia, unspecified: Secondary | ICD-10-CM | POA: Diagnosis not present

## 2021-10-24 DIAGNOSIS — K449 Diaphragmatic hernia without obstruction or gangrene: Secondary | ICD-10-CM | POA: Diagnosis not present

## 2021-10-24 DIAGNOSIS — E119 Type 2 diabetes mellitus without complications: Secondary | ICD-10-CM

## 2021-10-24 DIAGNOSIS — E1169 Type 2 diabetes mellitus with other specified complication: Secondary | ICD-10-CM | POA: Diagnosis present

## 2021-10-24 DIAGNOSIS — G2581 Restless legs syndrome: Secondary | ICD-10-CM | POA: Diagnosis not present

## 2021-10-24 DIAGNOSIS — E876 Hypokalemia: Secondary | ICD-10-CM | POA: Diagnosis not present

## 2021-10-24 DIAGNOSIS — I5022 Chronic systolic (congestive) heart failure: Secondary | ICD-10-CM | POA: Diagnosis not present

## 2021-10-24 DIAGNOSIS — K219 Gastro-esophageal reflux disease without esophagitis: Secondary | ICD-10-CM | POA: Diagnosis present

## 2021-10-24 HISTORY — PX: LAPAROSCOPIC GASTRIC SLEEVE RESECTION: SHX5895

## 2021-10-24 HISTORY — PX: UPPER GI ENDOSCOPY: SHX6162

## 2021-10-24 LAB — HEMOGLOBIN AND HEMATOCRIT, BLOOD
HCT: 39.4 % (ref 36.0–46.0)
Hemoglobin: 13 g/dL (ref 12.0–15.0)

## 2021-10-24 LAB — GLUCOSE, CAPILLARY: Glucose-Capillary: 97 mg/dL (ref 70–99)

## 2021-10-24 SURGERY — GASTRECTOMY, SLEEVE, LAPAROSCOPIC
Anesthesia: General | Site: Esophagus

## 2021-10-24 MED ORDER — KETAMINE HCL 50 MG/5ML IJ SOSY
PREFILLED_SYRINGE | INTRAMUSCULAR | Status: AC
  Filled 2021-10-24: qty 5

## 2021-10-24 MED ORDER — HYDROMORPHONE HCL 1 MG/ML IJ SOLN
0.5000 mg | INTRAMUSCULAR | Status: DC | PRN
  Administered 2021-10-24 (×2): 0.5 mg via INTRAVENOUS
  Filled 2021-10-24 (×2): qty 0.5

## 2021-10-24 MED ORDER — SODIUM CHLORIDE 0.9 % IV SOLN: INTRAVENOUS | Status: DC

## 2021-10-24 MED ORDER — 0.9 % SODIUM CHLORIDE (POUR BTL) OPTIME
TOPICAL | Status: DC | PRN
  Administered 2021-10-24: 1000 mL

## 2021-10-24 MED ORDER — PROMETHAZINE HCL 25 MG/ML IJ SOLN: 6.2500 mg | INTRAMUSCULAR | Status: DC | PRN

## 2021-10-24 MED ORDER — LIDOCAINE 20MG/ML (2%) 15 ML SYRINGE OPTIME
INTRAMUSCULAR | Status: DC | PRN
  Administered 2021-10-24: 1.5 mg/kg/h via INTRAVENOUS

## 2021-10-24 MED ORDER — METOPROLOL TARTRATE 5 MG/5ML IV SOLN: 5.0000 mg | Freq: Four times a day (QID) | INTRAVENOUS | Status: DC | PRN

## 2021-10-24 MED ORDER — APREPITANT 40 MG PO CAPS
40.0000 mg | ORAL_CAPSULE | ORAL | Status: AC
  Administered 2021-10-24: 40 mg via ORAL
  Filled 2021-10-24: qty 1

## 2021-10-24 MED ORDER — MIDAZOLAM HCL 5 MG/5ML IJ SOLN
INTRAMUSCULAR | Status: DC | PRN
  Administered 2021-10-24: 2 mg via INTRAVENOUS

## 2021-10-24 MED ORDER — LIDOCAINE HCL 2 % IJ SOLN
INTRAMUSCULAR | Status: AC
  Filled 2021-10-24: qty 20

## 2021-10-24 MED ORDER — ENSURE MAX PROTEIN PO LIQD
2.0000 [oz_av] | ORAL | Status: DC
  Administered 2021-10-25 (×4): 2 [oz_av] via ORAL

## 2021-10-24 MED ORDER — STERILE WATER FOR IRRIGATION IR SOLN
Status: DC | PRN
  Administered 2021-10-24: 1000 mL

## 2021-10-24 MED ORDER — HYDROMORPHONE HCL 1 MG/ML IJ SOLN
INTRAMUSCULAR | Status: AC
  Administered 2021-10-24: 0.5 mg via INTRAVENOUS
  Filled 2021-10-24: qty 1

## 2021-10-24 MED ORDER — ROCURONIUM BROMIDE 10 MG/ML (PF) SYRINGE
PREFILLED_SYRINGE | INTRAVENOUS | Status: DC | PRN
  Administered 2021-10-24: 80 mg via INTRAVENOUS

## 2021-10-24 MED ORDER — ACETAMINOPHEN 500 MG PO TABS
1000.0000 mg | ORAL_TABLET | Freq: Three times a day (TID) | ORAL | Status: DC
  Administered 2021-10-24 – 2021-10-25 (×4): 1000 mg via ORAL
  Filled 2021-10-24 (×4): qty 2

## 2021-10-24 MED ORDER — HYDRALAZINE HCL 20 MG/ML IJ SOLN: 10.0000 mg | INTRAMUSCULAR | Status: DC | PRN

## 2021-10-24 MED ORDER — KETAMINE HCL 10 MG/ML IJ SOLN
INTRAMUSCULAR | Status: DC | PRN
  Administered 2021-10-24: 15 mg via INTRAVENOUS
  Administered 2021-10-24 (×2): 10 mg via INTRAVENOUS
  Administered 2021-10-24: 15 mg via INTRAVENOUS

## 2021-10-24 MED ORDER — HYDROMORPHONE HCL 1 MG/ML IJ SOLN
0.2500 mg | INTRAMUSCULAR | Status: DC | PRN
  Administered 2021-10-24: 0.25 mg via INTRAVENOUS

## 2021-10-24 MED ORDER — FENTANYL CITRATE (PF) 100 MCG/2ML IJ SOLN
INTRAMUSCULAR | Status: AC
  Filled 2021-10-24: qty 2

## 2021-10-24 MED ORDER — AMISULPRIDE (ANTIEMETIC) 5 MG/2ML IV SOLN: 10.0000 mg | Freq: Once | INTRAVENOUS | Status: DC | PRN

## 2021-10-24 MED ORDER — HEPARIN SODIUM (PORCINE) 5000 UNIT/ML IJ SOLN
5000.0000 [IU] | Freq: Three times a day (TID) | INTRAMUSCULAR | Status: DC
  Administered 2021-10-24 – 2021-10-25 (×2): 5000 [IU] via SUBCUTANEOUS
  Filled 2021-10-24 (×2): qty 1

## 2021-10-24 MED ORDER — SUGAMMADEX SODIUM 200 MG/2ML IV SOLN
INTRAVENOUS | Status: DC | PRN
  Administered 2021-10-24: 200 mg via INTRAVENOUS

## 2021-10-24 MED ORDER — PROPOFOL 10 MG/ML IV BOLUS
INTRAVENOUS | Status: AC
  Filled 2021-10-24: qty 20

## 2021-10-24 MED ORDER — ONDANSETRON HCL 4 MG/2ML IJ SOLN
INTRAMUSCULAR | Status: DC | PRN
  Administered 2021-10-24: 4 mg via INTRAVENOUS

## 2021-10-24 MED ORDER — GABAPENTIN 100 MG PO CAPS
200.0000 mg | ORAL_CAPSULE | Freq: Two times a day (BID) | ORAL | Status: DC
  Administered 2021-10-24 – 2021-10-25 (×3): 200 mg via ORAL
  Filled 2021-10-24 (×3): qty 2

## 2021-10-24 MED ORDER — OXYCODONE HCL 5 MG/5ML PO SOLN
5.0000 mg | Freq: Four times a day (QID) | ORAL | Status: DC | PRN
  Administered 2021-10-24: 5 mg via ORAL
  Filled 2021-10-24: qty 5

## 2021-10-24 MED ORDER — ROCURONIUM BROMIDE 10 MG/ML (PF) SYRINGE
PREFILLED_SYRINGE | INTRAVENOUS | Status: AC
  Filled 2021-10-24: qty 10

## 2021-10-24 MED ORDER — ACETAMINOPHEN 500 MG PO TABS
1000.0000 mg | ORAL_TABLET | ORAL | Status: AC
  Administered 2021-10-24: 1000 mg via ORAL
  Filled 2021-10-24: qty 2

## 2021-10-24 MED ORDER — BUPIVACAINE-EPINEPHRINE (PF) 0.25% -1:200000 IJ SOLN
INTRAMUSCULAR | Status: AC
  Filled 2021-10-24: qty 30

## 2021-10-24 MED ORDER — SODIUM CHLORIDE 0.9 % IV SOLN
2.0000 g | INTRAVENOUS | Status: AC
  Administered 2021-10-24: 2 g via INTRAVENOUS
  Filled 2021-10-24: qty 2

## 2021-10-24 MED ORDER — MIDAZOLAM HCL 2 MG/2ML IJ SOLN
INTRAMUSCULAR | Status: AC
  Filled 2021-10-24: qty 2

## 2021-10-24 MED ORDER — SACUBITRIL-VALSARTAN 24-26 MG PO TABS
1.0000 | ORAL_TABLET | Freq: Two times a day (BID) | ORAL | Status: DC
  Administered 2021-10-25: 1 via ORAL
  Filled 2021-10-24: qty 1

## 2021-10-24 MED ORDER — BUPIVACAINE LIPOSOME 1.3 % IJ SUSP: 20.0000 mL | Freq: Once | INTRAMUSCULAR | Status: DC

## 2021-10-24 MED ORDER — DEXAMETHASONE SODIUM PHOSPHATE 10 MG/ML IJ SOLN
INTRAMUSCULAR | Status: DC | PRN
  Administered 2021-10-24: 10 mg via INTRAVENOUS

## 2021-10-24 MED ORDER — METOCLOPRAMIDE HCL 5 MG/ML IJ SOLN
10.0000 mg | Freq: Four times a day (QID) | INTRAMUSCULAR | Status: DC
  Administered 2021-10-24 – 2021-10-25 (×5): 10 mg via INTRAVENOUS
  Filled 2021-10-24 (×5): qty 2

## 2021-10-24 MED ORDER — CHLORHEXIDINE GLUCONATE 4 % EX LIQD: 60.0000 mL | Freq: Once | CUTANEOUS | Status: DC

## 2021-10-24 MED ORDER — LIDOCAINE 2% (20 MG/ML) 5 ML SYRINGE
INTRAMUSCULAR | Status: DC | PRN
  Administered 2021-10-24: 60 mg via INTRAVENOUS

## 2021-10-24 MED ORDER — BUPIVACAINE LIPOSOME 1.3 % IJ SUSP
INTRAMUSCULAR | Status: DC | PRN
  Administered 2021-10-24: 20 mL

## 2021-10-24 MED ORDER — LACTATED RINGERS IR SOLN
Status: DC | PRN
  Administered 2021-10-24: 1000 mL

## 2021-10-24 MED ORDER — FENTANYL CITRATE (PF) 100 MCG/2ML IJ SOLN
INTRAMUSCULAR | Status: DC | PRN
  Administered 2021-10-24 (×2): 50 ug via INTRAVENOUS

## 2021-10-24 MED ORDER — OXYCODONE HCL 5 MG/5ML PO SOLN: 5.0000 mg | Freq: Once | ORAL | Status: DC | PRN

## 2021-10-24 MED ORDER — BUPIVACAINE LIPOSOME 1.3 % IJ SUSP
INTRAMUSCULAR | Status: AC
  Filled 2021-10-24: qty 20

## 2021-10-24 MED ORDER — ORAL CARE MOUTH RINSE: 15.0000 mL | Freq: Once | OROMUCOSAL | Status: AC

## 2021-10-24 MED ORDER — CHLORHEXIDINE GLUCONATE 0.12 % MT SOLN
15.0000 mL | Freq: Once | OROMUCOSAL | Status: AC
  Administered 2021-10-24: 15 mL via OROMUCOSAL

## 2021-10-24 MED ORDER — DOCUSATE SODIUM 100 MG PO CAPS
100.0000 mg | ORAL_CAPSULE | Freq: Two times a day (BID) | ORAL | Status: DC
  Administered 2021-10-24 – 2021-10-25 (×3): 100 mg via ORAL
  Filled 2021-10-24 (×3): qty 1

## 2021-10-24 MED ORDER — LACTATED RINGERS IV SOLN: INTRAVENOUS | Status: DC

## 2021-10-24 MED ORDER — ACETAMINOPHEN 160 MG/5ML PO SOLN: 1000.0000 mg | Freq: Three times a day (TID) | ORAL | Status: DC

## 2021-10-24 MED ORDER — TRAMADOL HCL 50 MG PO TABS
50.0000 mg | ORAL_TABLET | Freq: Four times a day (QID) | ORAL | Status: DC | PRN
  Administered 2021-10-25: 50 mg via ORAL
  Filled 2021-10-24 (×2): qty 1

## 2021-10-24 MED ORDER — GABAPENTIN 300 MG PO CAPS
300.0000 mg | ORAL_CAPSULE | ORAL | Status: AC
  Administered 2021-10-24: 300 mg via ORAL
  Filled 2021-10-24: qty 1

## 2021-10-24 MED ORDER — ONDANSETRON HCL 4 MG/2ML IJ SOLN: 4.0000 mg | INTRAMUSCULAR | Status: DC | PRN

## 2021-10-24 MED ORDER — PANTOPRAZOLE SODIUM 40 MG IV SOLR
40.0000 mg | Freq: Every day | INTRAVENOUS | Status: DC
  Administered 2021-10-24: 40 mg via INTRAVENOUS
  Filled 2021-10-24: qty 10

## 2021-10-24 MED ORDER — EPHEDRINE SULFATE-NACL 50-0.9 MG/10ML-% IV SOSY
PREFILLED_SYRINGE | INTRAVENOUS | Status: DC | PRN
  Administered 2021-10-24: 5 mg via INTRAVENOUS

## 2021-10-24 MED ORDER — ESCITALOPRAM OXALATE 20 MG PO TABS
20.0000 mg | ORAL_TABLET | Freq: Every day | ORAL | Status: DC
  Administered 2021-10-25: 20 mg via ORAL
  Filled 2021-10-24: qty 1

## 2021-10-24 MED ORDER — PROPOFOL 10 MG/ML IV BOLUS
INTRAVENOUS | Status: DC | PRN
  Administered 2021-10-24: 180 mg via INTRAVENOUS

## 2021-10-24 MED ORDER — OXYCODONE HCL 5 MG PO TABS: 5.0000 mg | ORAL_TABLET | Freq: Once | ORAL | Status: DC | PRN

## 2021-10-24 MED ORDER — HEPARIN SODIUM (PORCINE) 5000 UNIT/ML IJ SOLN
5000.0000 [IU] | INTRAMUSCULAR | Status: AC
  Administered 2021-10-24: 5000 [IU] via SUBCUTANEOUS
  Filled 2021-10-24: qty 1

## 2021-10-24 MED ORDER — DEXAMETHASONE SODIUM PHOSPHATE 10 MG/ML IJ SOLN
INTRAMUSCULAR | Status: AC
  Filled 2021-10-24: qty 1

## 2021-10-24 MED ORDER — MEPERIDINE HCL 50 MG/ML IJ SOLN: 6.2500 mg | INTRAMUSCULAR | Status: DC | PRN

## 2021-10-24 MED ORDER — ONDANSETRON HCL 4 MG/2ML IJ SOLN
INTRAMUSCULAR | Status: AC
  Filled 2021-10-24: qty 2

## 2021-10-24 MED ORDER — METHOCARBAMOL 1000 MG/10ML IJ SOLN
500.0000 mg | Freq: Four times a day (QID) | INTRAVENOUS | Status: DC | PRN
  Filled 2021-10-24: qty 5

## 2021-10-24 MED ORDER — BUPIVACAINE-EPINEPHRINE 0.25% -1:200000 IJ SOLN
INTRAMUSCULAR | Status: DC | PRN
  Administered 2021-10-24: 30 mL

## 2021-10-24 MED ORDER — SIMETHICONE 80 MG PO CHEW: 80.0000 mg | CHEWABLE_TABLET | Freq: Four times a day (QID) | ORAL | Status: DC | PRN

## 2021-10-24 MED ORDER — MAGNESIUM OXIDE -MG SUPPLEMENT 400 (240 MG) MG PO TABS
400.0000 mg | ORAL_TABLET | Freq: Every day | ORAL | Status: DC
  Administered 2021-10-24: 400 mg via ORAL
  Filled 2021-10-24 (×2): qty 1

## 2021-10-24 SURGICAL SUPPLY — 77 items
APL PRP STRL LF DISP 70% ISPRP (MISCELLANEOUS) ×4
APL SKNCLS STERI-STRIP NONHPOA (GAUZE/BANDAGES/DRESSINGS) ×2
APPLIER CLIP ROT 10 11.4 M/L (STAPLE)
APPLIER CLIP ROT 13.4 12 LRG (CLIP)
APR CLP LRG 13.4X12 ROT 20 MLT (CLIP)
APR CLP MED LRG 11.4X10 (STAPLE)
BAG COUNTER SPONGE SURGICOUNT (BAG) IMPLANT
BAG LAPAROSCOPIC 12 15 PORT 16 (BASKET) IMPLANT
BAG RETRIEVAL 12/15 (BASKET) ×2
BAG SPNG CNTER NS LX DISP (BAG)
BENZOIN TINCTURE PRP APPL 2/3 (GAUZE/BANDAGES/DRESSINGS) ×2 IMPLANT
BLADE SURG SZ11 CARB STEEL (BLADE) ×2 IMPLANT
BNDG ADH 1X3 SHEER STRL LF (GAUZE/BANDAGES/DRESSINGS) ×12 IMPLANT
BNDG ADH THN 3X1 STRL LF (GAUZE/BANDAGES/DRESSINGS) ×12
CABLE HIGH FREQUENCY MONO STRZ (ELECTRODE) IMPLANT
CHLORAPREP W/TINT 26 (MISCELLANEOUS) ×4 IMPLANT
CLIP APPLIE ROT 10 11.4 M/L (STAPLE) IMPLANT
CLIP APPLIE ROT 13.4 12 LRG (CLIP) IMPLANT
COVER SURGICAL LIGHT HANDLE (MISCELLANEOUS) ×2 IMPLANT
DEVICE SUT QUICK LOAD TK 5 (SUTURE) IMPLANT
DEVICE SUT TI-KNOT TK 5X26 (SUTURE) IMPLANT
DRAPE UTILITY XL STRL (DRAPES) ×4 IMPLANT
ELECT REM PT RETURN 15FT ADLT (MISCELLANEOUS) ×2 IMPLANT
GAUZE SPONGE 4X4 12PLY STRL (GAUZE/BANDAGES/DRESSINGS) IMPLANT
GLOVE BIO SURGEON STRL SZ 6 (GLOVE) ×2 IMPLANT
GLOVE INDICATOR 6.5 STRL GRN (GLOVE) ×2 IMPLANT
GLOVE SS BIOGEL STRL SZ 6 (GLOVE) ×2 IMPLANT
GOWN STRL REUS W/ TWL LRG LVL3 (GOWN DISPOSABLE) ×2 IMPLANT
GOWN STRL REUS W/ TWL XL LVL3 (GOWN DISPOSABLE) IMPLANT
GOWN STRL REUS W/TWL LRG LVL3 (GOWN DISPOSABLE) ×2
GOWN STRL REUS W/TWL XL LVL3 (GOWN DISPOSABLE)
GRASPER SUT TROCAR 14GX15 (MISCELLANEOUS) ×2 IMPLANT
IRRIG SUCT STRYKERFLOW 2 WTIP (MISCELLANEOUS) ×2
IRRIGATION SUCT STRKRFLW 2 WTP (MISCELLANEOUS) ×2 IMPLANT
KIT BASIN OR (CUSTOM PROCEDURE TRAY) ×2 IMPLANT
KIT TURNOVER KIT A (KITS) IMPLANT
MARKER SKIN DUAL TIP RULER LAB (MISCELLANEOUS) ×2 IMPLANT
MAT PREVALON FULL STRYKER (MISCELLANEOUS) ×2 IMPLANT
NDL SPNL 22GX3.5 QUINCKE BK (NEEDLE) ×2 IMPLANT
NEEDLE SPNL 22GX3.5 QUINCKE BK (NEEDLE) ×2 IMPLANT
PACK UNIVERSAL I (CUSTOM PROCEDURE TRAY) ×2 IMPLANT
RELOAD ENDO STITCH (ENDOMECHANICALS) ×2 IMPLANT
RELOAD STAPLE 60 3.6 BLU REG (STAPLE) ×2 IMPLANT
RELOAD STAPLE 60 3.8 GOLD REG (STAPLE) ×2 IMPLANT
RELOAD STAPLE 60 4.1 GRN THCK (STAPLE) IMPLANT
RELOAD STAPLER BLUE 60MM (STAPLE) ×10 IMPLANT
RELOAD STAPLER GOLD 60MM (STAPLE) ×2 IMPLANT
RELOAD STAPLER GREEN 60MM (STAPLE) IMPLANT
RELOAD SUT TRIPLE-STITCH 2-0 (ENDOMECHANICALS) IMPLANT
SCISSORS LAP 5X45 EPIX DISP (ENDOMECHANICALS) ×2 IMPLANT
SET TUBE SMOKE EVAC HIGH FLOW (TUBING) ×2 IMPLANT
SHEARS HARMONIC ACE PLUS 45CM (MISCELLANEOUS) ×2 IMPLANT
SLEEVE ADV FIXATION 5X100MM (TROCAR) ×4 IMPLANT
SLEEVE GASTRECTOMY 40FR VISIGI (MISCELLANEOUS) ×2 IMPLANT
SOL ANTI FOG 6CC (MISCELLANEOUS) ×2 IMPLANT
SOLUTION ANTI FOG 6CC (MISCELLANEOUS) ×2
SPIKE FLUID TRANSFER (MISCELLANEOUS) ×2 IMPLANT
STAPLE LINE REINFORCEMENT LAP (STAPLE) IMPLANT
STAPLER ECHELON LONG 60 440 (INSTRUMENTS) ×2 IMPLANT
STAPLER RELOAD BLUE 60MM (STAPLE) ×10
STAPLER RELOAD GOLD 60MM (STAPLE) ×2
STAPLER RELOAD GREEN 60MM (STAPLE)
STRIP CLOSURE SKIN 1/2X4 (GAUZE/BANDAGES/DRESSINGS) ×2 IMPLANT
SUT MNCRL AB 4-0 PS2 18 (SUTURE) ×2 IMPLANT
SUT SURGIDAC NAB ES-9 0 48 120 (SUTURE) IMPLANT
SUT VICRYL 0 TIES 12 18 (SUTURE) ×2 IMPLANT
SYR 10ML ECCENTRIC (SYRINGE) ×2 IMPLANT
SYR 20ML LL LF (SYRINGE) ×2 IMPLANT
SYR 50ML LL SCALE MARK (SYRINGE) ×2 IMPLANT
SYS KII OPTICAL ACCESS 15MM (TROCAR) ×2
SYSTEM KII OPTICAL ACCESS 15MM (TROCAR) ×2 IMPLANT
TOWEL OR 17X26 10 PK STRL BLUE (TOWEL DISPOSABLE) ×2 IMPLANT
TOWEL OR NON WOVEN STRL DISP B (DISPOSABLE) ×2 IMPLANT
TROCAR ADV FIXATION 5X100MM (TROCAR) ×2 IMPLANT
TROCAR XCEL NON-BLD 5MMX100MML (ENDOMECHANICALS) ×2 IMPLANT
TUBING CONNECTING 10 (TUBING) ×2 IMPLANT
TUBING ENDO SMARTCAP (MISCELLANEOUS) ×2 IMPLANT

## 2021-10-24 NOTE — Anesthesia Procedure Notes (Signed)
Procedure Name: Intubation Date/Time: 10/24/2021 8:10 AM  Performed by: Brindy Higginbotham D, CRNAPre-anesthesia Checklist: Patient identified, Emergency Drugs available, Suction available and Patient being monitored Patient Re-evaluated:Patient Re-evaluated prior to induction Oxygen Delivery Method: Circle system utilized Preoxygenation: Pre-oxygenation with 100% oxygen Induction Type: IV induction Ventilation: Mask ventilation without difficulty Laryngoscope Size: Mac and 4 Grade View: Grade I Tube type: Oral Tube size: 7.0 mm Number of attempts: 1 Airway Equipment and Method: Stylet and Oral airway Placement Confirmation: ETT inserted through vocal cords under direct vision, positive ETCO2 and breath sounds checked- equal and bilateral Secured at: 21 cm Tube secured with: Tape Dental Injury: Teeth and Oropharynx as per pre-operative assessment

## 2021-10-24 NOTE — Transfer of Care (Signed)
Immediate Anesthesia Transfer of Care Note  Patient: Traci Roy  Procedure(s) Performed: LAPAROSCOPIC SLEEVE GASTRECTOMY WITH HIATAL HERNIA REPAIR (Abdomen) UPPER GI ENDOSCOPY (Esophagus)  Patient Location: PACU  Anesthesia Type:General  Level of Consciousness: awake, alert  and oriented  Airway & Oxygen Therapy: Patient Spontanous Breathing and Patient connected to face mask oxygen  Post-op Assessment: Report given to RN and Post -op Vital signs reviewed and stable  Post vital signs: Reviewed and stable  Last Vitals:  Vitals Value Taken Time  BP 150/74 10/24/21 0936  Temp    Pulse 68 10/24/21 0940  Resp 0 10/24/21 0940  SpO2 100 % 10/24/21 0940  Vitals shown include unvalidated device data.  Last Pain:  Vitals:   10/24/21 0544  TempSrc:   PainSc: 0-No pain         Complications: No notable events documented.

## 2021-10-24 NOTE — H&P (Signed)
Traci Roy [id number]    Referring Provider:  Self     Subjective    Chief Complaint: weight loss- pre op visit       History of Present Illness: Follow-up regarding surgical treatment of morbid obesity.  She denies any changes in her health.  She has completed the preoperative work-up without any barriers identified.  Chest x-ray and upper GI were normal's, no hiatal hernia.  She has been cleared by cardiology, and is to hold aspirin for 5 days preop. She has several insightful questions to discuss today but in general is anxiously awaiting surgery next month.     61 year old woman with a history of arthritis, chronic back pain from a previous back injury, congestive heart failure, depression, diabetes, GERD (occasional), history of duodenal ulcer, history of shingles, hypertension, restless legs who presents for consultation regarding bariatric surgery.  She initially saw my partner Dr. Kieth Brightly in 2016 and completed the pathway but had some reservations about proceeding with surgery.  She did have a hiatal hernia on her upper GI but all of her other preop work-up was normal.  In the interim she has had some health issues as listed below. She is interested in sleeve gastrectomy as she is hoping to improve her overall health, and avoid further worsening of current comorbidities.  She has successfully lost about 20 pounds just by behavioral changes recently and has started tracking her calories.   Previous abdominal surgery includes C-section.   She had heart catheterization in November 2021 with LAD stent placement after presenting to the emergency room with chest pain.  Her EF at that time was 40 to 45%.  CT at that time showed a trace right pleural effusion, hepatic steatosis and a 4 cm left hepatic cyst.  Her cardiologist is Dr. Marlou Porch, who she last saw in March of this year (.Marland KitchenMarland KitchenAt prior visit, she was quite concerned about her echo report as well as diagnosis of chronic systolic heart  failure.  She was concerned about her mortality dramatically increasing however she is NYHA class I typically.   She has had fatigue for quite some time.  Her memory has been not the best.  She called wanting to know if this was her heart medication.  She was encouraged by her pharmacy team to start walking.  Did not like swimming because it hurt her back at the [facility].   She was offered to switch Entresto to another ARB if she really thought it was contributing to her fatigue.  She decided to continue Entresto.  I also talked about GLP-1, cardiovascular benefit as well as weight loss diabetes control.  She was resistant to multiple medications.   Resources were provided on good nutrition, Zoe podcast for instance and she was quite honest that she probably was not going to listen to a podcast to read a book or count anything.   She is worried about the ramifications of weight loss surgery.").   Most recent emergency room visit was earlier this month where she was diagnosed with gastritis after presenting with abdominal pain.  She has a history recently of persistent chronic diarrhea, transiently improved with Cipro.  She has been referred to a gastroenterologist to further evaluate this, but has not done so yet.   11/05/14: The patient is a 61 year old female who presents for a bariatric surgery evaluation.Associated symptoms include anxiety, poor self esteem, joint pains and back pain. Initial onset of obesity was in childhood. Initial presentation included inability to  lose weight. Patient's highest weight was 256 ___. Patient's lowest weight in adulthood was 149 ___. Disease complications include hypertension and osteoarthritis, while disease complications do not include thromboembolism, pulmonary hypertension, fatty liver, thrombophlebitis, coronary artery disease or peripheral vascular disease. Diets tried in the past include low calorie, low carbohydrate and low fat. Current diet includes well  balanced meals. 1-2 times per week. Limitations to weight loss do not include daily sugared beverages, breads and pastas or alcohol intake. Pertinent family history includes obesity. The patient is currently able to do activities of daily living without limitations. anxiety. The patient has heartburn (less than 1x/week, not on meds or OTCs )The patient denies dysphagia. Her pcp is Dr. Justin Mend who agrees with weight loss surgery to improve health 256lb/ bmi 44       Review of Systems: A complete review of systems was obtained from the patient.  I have reviewed this information and discussed as appropriate with the patient.  See HPI as well for other ROS.     Medical History: Past Medical History         Past Medical History:  Diagnosis Date   Anxiety     Arthritis     CHF (congestive heart failure) (CMS-HCC)     Chronic kidney disease     Diabetes mellitus without complication (CMS-HCC)     Hyperlipidemia     Hypertension               Patient Active Problem List  Diagnosis   Acute respiratory failure with hypoxia (CMS-HCC)   Acute stress reaction   AKI (acute kidney injury) (CMS-HCC)   Angina pectoris (CMS-HCC)   Anxiety   Body mass index (BMI) 40.0-44.9, adult (CMS-HCC)   Atherosclerotic heart disease of native coronary artery without angina pectoris   Chronic fatigue syndrome   Chronic kidney disease, stage 3 unspecified (CMS-HCC)   Daytime somnolence   Degenerative joint disease (DJD) of hip   Essential hypertension   Hyperglycemia   Hyperlipidemia   Hypokalemia   Left ventricular dysfunction   Liver cyst   Major depressive disorder, single episode, unspecified   Moderate recurrent major depression (CMS-HCC)   Morbid obesity (CMS-HCC)   Morton's neuroma of left foot   Noninfective gastroenteritis and colitis, unspecified   Other specified conditions associated with female genital organs and menstrual cycle   Pneumonia due to COVID-19 virus   Sciatica, left side    Type 2 diabetes mellitus with other specified complication (CMS-HCC)      Past Surgical History           Past Surgical History:  Procedure Laterality Date   Bladder Stretching   1970   TONSILLECTOMY   1971   CESAREAN SECTION   6761   TRANSUMBILICAL AUGMENTATION MAMMAPLASTY Bilateral 2005   Hip Replacement Surgery    2015   COLONOSCOPY   2016   Stent Placement Surgery    2021        Allergies           Allergies  Allergen Reactions   Lisinopril Cough   Adhesive Tape-Silicones Itching and Rash      Itching and breaks out                   Current Outpatient Medications on File Prior to Visit  Medication Sig Dispense Refill   aspirin 81 MG EC tablet 1 tablet       atorvastatin (LIPITOR) 40 MG tablet 1 tablet  escitalopram oxalate (LEXAPRO) 20 MG tablet Take 1 tablet by mouth once daily       magnesium oxide 250 mg magnesium Tab Take by mouth       multivitamin capsule 1 tablet       sacubitriL-valsartan (ENTRESTO) 24-26 mg tablet 1 tablet       TRULICITY 6.57 QI/6.9 mL subcutaneous pen injector AS DIRECTED SUBCUTANEOUS 0.5 ML ONCE A WEEK 30 DAYS        No current facility-administered medications on file prior to visit.      Family History  History reviewed. No pertinent family history.      Social History         Tobacco Use  Smoking Status Never  Smokeless Tobacco Never      Social History  Social History           Socioeconomic History   Marital status: Married  Tobacco Use   Smoking status: Never   Smokeless tobacco: Never  Substance and Sexual Activity   Alcohol use: Not Currently   Drug use: Never        Objective:           Vitals:    09/21/21 0951  BP: (!) 142/72  Pulse: 92  Weight: (!) 102.5 kg (226 lb)  Height: 160 cm ('5\' 3"'$ )    Body mass index is 40.03 kg/m.   Alert, well-appearing Unlabored respirations   Assessment and Plan:    She remains a candidate for sleeve gastrectomy.  We have previously discussed the  surgery in detail including risks, benefits, alternatives and anticipated postop recovery.  All of her questions were answered today to her satisfaction.  We again discussed the importance of lifelong behavioral changes to combat the chronic and relapsing disease which is obesity.  We will plan to proceed with surgery as scheduled next month.  Hold aspirin 5 days preop.   Jori Frerichs Raquel James, MD

## 2021-10-24 NOTE — Progress Notes (Signed)
PHARMACY CONSULT FOR:  Risk Assessment for Post-Discharge VTE Following Bariatric Surgery  Post-Discharge VTE Risk Assessment: This patient's probability of 30-day post-discharge VTE is increased due to the factors marked: x Sleeve gastrectomy   Liver disorder (transplant, cirrhosis, or nonalcoholic steatohepatitis)   Hx of VTE   Hemorrhage requiring transfusion   GI perforation, leak, or obstruction   ====================================================    Female    Age >/=60 years    BMI >/=50 kg/m2  x  CHF    Dyspnea at Rest    Paraplegia  x  Non-gastric-band surgery    Operation Time >/=3 hr    Return to OR     Length of Stay >/= 3 d   Hypercoagulable condition   Significant venous stasis      Predicted probability of 30-day post-discharge VTE: 1.03%  Other patient-specific factors to consider:   Recommendation for Discharge: Enoxaparin 40 mg [place] q12h x 4 weeks post-discharge    Traci Roy is a 61 y.o. female who underwent laparoscopic sleeve gastrectomy on 10/24/21   Case start: 0824 Case end: 0928    Allergies  Allergen Reactions   Lisinopril Cough   Adhesive [Tape] Itching and Rash    Itching and breaks out   Silicone Itching and Rash    Itching and breaks out    Patient Measurements: Weight: 100 kg (220 lb 6.4 oz) Body mass index is 39.04 kg/m.  No results for input(s): "WBC", "HGB", "HCT", "PLT", "APTT", "CREATININE", "LABCREA", "CREAT24HRUR", "MG", "PHOS", "ALBUMIN", "PROT", "AST", "ALT", "ALKPHOS", "BILITOT", "BILIDIR", "IBILI" in the last 72 hours. Estimated Creatinine Clearance: 74 mL/min (by C-G formula based on SCr of 0.9 mg/dL).    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   CHF (congestive heart failure) ([place])    Depression    takes Lexapro daily   Diabetes mellitus without complication ([place])    Dizziness    occasionally and no meds   Flu    end of Dec 2014    GERD (gastroesophageal reflux disease)    occasionally and no meds required   History of bronchitis    last time many yrs ago   History of duodenal ulcer    History of shingles 3-5 YRS AGO   Hypertension    takes Losartan and HCTZ daily   Joint pain    Restless legs      Medications Prior to Admission  Medication Sig Dispense Refill Last Dose   acetaminophen (TYLENOL) 500 MG tablet Take 500 mg by mouth every 6 (six) hours as needed for moderate pain or mild pain.   Past Month   aspirin EC 81 MG tablet Take 1 tablet (81 mg total) by mouth daily. Swallow whole. 90 tablet 3 10/17/2021   escitalopram (LEXAPRO) 20 MG tablet Take 20 mg by mouth daily.   10/24/2021 at 0400   Magnesium Oxide 250 MG TABS Take 500 mg by mouth at bedtime.   Past Week   nitroGLYCERIN (NITROSTAT) 0.4 MG SL tablet PLACE 1 TABLET (0.4 MG TOTAL) UNDER THE TONGUE EVERY FIVE MINUTES AS NEEDED FOR CHEST PAIN. 25 tablet 0    sacubitril-valsartan (ENTRESTO) 24-26 MG TAKE 1 TABLET BY MOUTH TWICE A DAY 60 tablet 11 Past Week       Traci Roy 10/24/2021,12:04 PM

## 2021-10-24 NOTE — Op Note (Signed)
Operative Note  SIEDAH SEDOR  [id number]  [id number]  10/24/2021   Surgeon: Romana Juniper MD FACS   Assistant: Kaylyn Lim MD FACS   Procedure performed: laparoscopic sleeve gastrectomy, hiatal hernia repair, upper endoscopy   Preop diagnosis: Morbid obesity Body mass index is 39.04 kg/m. Post-op diagnosis/intraop findings: same   Specimens: fundus Retained items: none  EBL: minimal  Complications: none   Description of procedure: After obtaining informed consent and administration of chemical DVT prophylaxis in holding, the patient was taken to the operating room and placed supine on operating room table where general endotracheal anesthesia was initiated, preoperative antibiotics were administered, SCDs applied, and a formal timeout was performed. The abdomen was prepped and draped in usual sterile fashion. Peritoneal access was gained using a Visiport technique in the left upper quadrant and insufflation to 15 mmHg ensued without issue. Gross inspection revealed no evidence of injury or abnormalities.  Under direct visualization three more 5 mm trochars were placed in the right and left hemiabdomen and the 27m trocar in the right paramedian upper abdomen. Bilateral laparoscopic assisted TAPS blocks were performed with Exparel diluted with 0.25 percent Marcaine with epinephrine. The patient was placed in steep reverseTrendelenburg and the liver retractor was introduced through an incision in the upper midline and secured to the post externally to maintain the left lobe retracted anteriorly.  There were some thin omental adhesions in the left upper quadrant which were taken down with harmonic scalpel.  There was a small hiatal hernia on direct inspection of the hiatus.  The pars lucida was entered with Harmonic scalpel and the posterior aspect of the right and left crus were dissected out using Harmonic and blunt dissection. The hiatus was narrowed to the appropriate diameter with a single  suture of 0 Ethibond secured with the ty-knot device.  Using the Harmonic scalpel, the greater curvature of the stomach was dissected away from the greater omentum and short gastric vessels were divided. This began 6 cm from the pylorus, and dissection proceeded until the left crus was clearly exposed. There were some filmy adhesions of the posterior stomach to the retroperitoneum which were divided with the Harmonic. Esophageal fat pad was mobilized off the anterior stomach slightly. The [id number] was then introduced and directed down towards the pylorus. This was placed to suction against the lesser curve. Serial fires of the linear cutting stapler with staple line reinforcement were then employed to create our sleeve. The first fire used a gold load and ensured adequate room at the angularis incisura. Several blue loads were then employed to create a narrow tubular stomach preserving 1 cm lateral to to the angle of His. The excised stomach was then removed through our 15 mm trocar site within an Endo Catch bag.  The visigi was taken off of suction and a few puffs of air were introduced, inflating the sleeve. No bubbles were observed in the irrigation fluid around the stomach and the shape was noted to be evenly tubular without any narrowing at the angularis. The visigi was then removed.  At this point the insufflation pressure was dropped to 8 mmHg for the remainder of the case.  Upper endoscopy was performed by the assistant surgeon and the sleeve was noted to be airtight, the staple line was hemostatic.  Lumen was confirmed to be evenly tubular without any significant angulation or narrowing specifically at the angularis incisura.  Please see his separate note. The endoscope was removed. A small amount of oozing  on the staple line was addressed with clips. The 15 mm trocar site fascia in the right upper abdomen was closed with a 0 Vicryl using the laparoscopic suture passer under direct visualization.  The liver retractor was removed under direct visualization. The abdomen was then desufflated and all remaining trochars removed. The skin incisions were closed with subcuticular Monocryl; benzoin, Steri-Strips and Band-Aids were applied The patient was then awakened, extubated and taken to PACU in stable condition.     All counts were correct at the completion of the case.

## 2021-10-24 NOTE — Anesthesia Postprocedure Evaluation (Signed)
Anesthesia Post Note  Patient: Traci Roy  Procedure(s) Performed: LAPAROSCOPIC SLEEVE GASTRECTOMY WITH HIATAL HERNIA REPAIR (Abdomen) UPPER GI ENDOSCOPY (Esophagus)     Patient location during evaluation: PACU Anesthesia Type: General Level of consciousness: awake and alert Pain management: pain level controlled Vital Signs Assessment: post-procedure vital signs reviewed and stable Respiratory status: spontaneous breathing, nonlabored ventilation and respiratory function stable Cardiovascular status: blood pressure returned to baseline and stable Postop Assessment: no apparent nausea or vomiting Anesthetic complications: no   No notable events documented.  Last Vitals:  Vitals:   10/24/21 1130 10/24/21 1222  BP: 127/73 125/66  Pulse: 63 60  Resp: 13 14  Temp:    SpO2: 97% 91%    Last Pain:  Vitals:   10/24/21 1115  TempSrc:   PainSc: Asleep                 Lynda Rainwater

## 2021-10-24 NOTE — ACP (Advance Care Planning) (Signed)
Patient wishes for no life support measures in the event of a catastrophic outcome. Ok to continue full code if ROSC and anticipated return to normal function but no ventilator, feeding tubes, etc.

## 2021-10-24 NOTE — Op Note (Signed)
Traci Roy [id number] [birth year] 10/24/2021  Preoperative diagnosis: sleeve gastrectomy in progress with hiatal hernia repair  Postoperative diagnosis: Same   Procedure: Upper endoscopy   Surgeon: Catalina Antigua B. Hassell Roy  M.D., FACS   Anesthesia: Gen.   Indications for procedure: This patient was undergoing a sleeve gastrectomy for morbid obesity.    Description of procedure: The endoscopy was placed in the mouth and into the oropharynx and under endoscopic vision it was advanced to the esophagogastric junction. No hiatal hernia was seen. The sleeve was insufflated and the scope passed into the antrum visualizing the pylorus.  Cylindrical sleeve was seen.   No bleeding or leaks were detected.  The scope was withdrawn without difficulty.     Traci B. Hassell Done, MD, FACS General, Bariatric, & Minimally Invasive Surgery [facility], [place]

## 2021-10-25 ENCOUNTER — Encounter (HOSPITAL_COMMUNITY): Payer: Self-pay | Admitting: Surgery

## 2021-10-25 LAB — CBC
HCT: 34.7 % — ABNORMAL LOW (ref 36.0–46.0)
Hemoglobin: 11 g/dL — ABNORMAL LOW (ref 12.0–15.0)
MCH: 30.6 pg (ref 26.0–34.0)
MCHC: 31.7 g/dL (ref 30.0–36.0)
MCV: 96.7 fL (ref 80.0–100.0)
Platelets: 202 10*3/uL (ref 150–400)
RBC: 3.59 MIL/uL — ABNORMAL LOW (ref 3.87–5.11)
RDW: 12.2 % (ref 11.5–15.5)
WBC: 8.6 10*3/uL (ref 4.0–10.5)
nRBC: 0 % (ref 0.0–0.2)

## 2021-10-25 LAB — COMPREHENSIVE METABOLIC PANEL
ALT: 15 U/L (ref 0–44)
AST: 14 U/L — ABNORMAL LOW (ref 15–41)
Albumin: 2.7 g/dL — ABNORMAL LOW (ref 3.5–5.0)
Alkaline Phosphatase: 47 U/L (ref 38–126)
Anion gap: 4 — ABNORMAL LOW (ref 5–15)
BUN: 12 mg/dL (ref 8–23)
CO2: 23 mmol/L (ref 22–32)
Calcium: 7.1 mg/dL — ABNORMAL LOW (ref 8.9–10.3)
Chloride: 118 mmol/L — ABNORMAL HIGH (ref 98–111)
Creatinine, Ser: 0.64 mg/dL (ref 0.44–1.00)
GFR, Estimated: >60 mL/min (ref >60)
Glucose, Bld: 84 mg/dL (ref 70–99)
Potassium: 3.7 mmol/L (ref 3.5–5.1)
Sodium: 145 mmol/L (ref 135–145)
Total Bilirubin: 0.4 mg/dL (ref 0.3–1.2)
Total Protein: 4.8 g/dL — ABNORMAL LOW (ref 6.5–8.1)

## 2021-10-25 LAB — HEMOGLOBIN AND HEMATOCRIT, BLOOD
HCT: 37.4 % (ref 36.0–46.0)
Hemoglobin: 12 g/dL (ref 12.0–15.0)

## 2021-10-25 LAB — MAGNESIUM: Magnesium: 1.6 mg/dL — ABNORMAL LOW (ref 1.7–2.4)

## 2021-10-25 LAB — SURGICAL PATHOLOGY

## 2021-10-25 MED ORDER — ONDANSETRON 4 MG PO TBDP: 4.0000 mg | ORAL_TABLET | Freq: Four times a day (QID) | ORAL | 0 refills | Status: DC | PRN

## 2021-10-25 MED ORDER — PANTOPRAZOLE SODIUM 40 MG PO TBEC: 40.0000 mg | DELAYED_RELEASE_TABLET | Freq: Every day | ORAL | 0 refills | Status: DC

## 2021-10-25 MED ORDER — GABAPENTIN 100 MG PO CAPS: 200.0000 mg | ORAL_CAPSULE | Freq: Two times a day (BID) | ORAL | 0 refills | Status: AC

## 2021-10-25 MED ORDER — POTASSIUM CHLORIDE 20 MEQ PO PACK
20.0000 meq | PACK | Freq: Once | ORAL | Status: AC
  Administered 2021-10-25: 20 meq via ORAL
  Filled 2021-10-25: qty 1

## 2021-10-25 MED ORDER — ACETAMINOPHEN 500 MG PO TABS: 1000.0000 mg | ORAL_TABLET | Freq: Three times a day (TID) | ORAL | 0 refills | Status: AC

## 2021-10-25 MED ORDER — DOCUSATE SODIUM 100 MG PO CAPS: 100.0000 mg | ORAL_CAPSULE | Freq: Two times a day (BID) | ORAL | 0 refills | Status: AC

## 2021-10-25 MED ORDER — TRAMADOL HCL 50 MG PO TABS: 50.0000 mg | ORAL_TABLET | Freq: Four times a day (QID) | ORAL | 0 refills | Status: DC | PRN

## 2021-10-25 MED ORDER — MAGNESIUM SULFATE 4 GM/100ML IV SOLN
4.0000 g | Freq: Once | INTRAVENOUS | Status: AC
  Administered 2021-10-25: 4 g via INTRAVENOUS
  Filled 2021-10-25: qty 100

## 2021-10-25 MED ORDER — ENOXAPARIN (LOVENOX) PATIENT EDUCATION KIT
PACK | Freq: Once | Status: AC
  Filled 2021-10-25: qty 1

## 2021-10-25 NOTE — Progress Notes (Signed)
Patient alert and oriented, pain is controlled. Patient is tolerating fluids, advanced to protein shake today, patient is tolerating well.  Reviewed Gastric sleeve discharge instructions with patient and patient is able to articulate understanding.  Provided information on BELT program, Support Group and [facility] outpatient pharmacy. All questions answered, will continue to monitor.  

## 2021-10-25 NOTE — Discharge Instructions (Signed)

## 2021-10-25 NOTE — Plan of Care (Signed)
  Problem: Activity: Goal: Ability to tolerate increased activity will improve Outcome: Progressing   

## 2021-10-25 NOTE — Discharge Summary (Signed)
Physician Discharge Summary  Traci Roy:[id number] DOB: Jun 13, 1960 DOA: 10/24/2021  PCP: Jonathon Jordan, MD  Admit date: 10/24/2021 Discharge date: 10/25/2021  Recommendations for Outpatient Follow-up:    Follow-up Information     Clovis Riley, MD. Go on 11/14/2021.   Specialty: General Surgery Why: at 3pm. Please arrive 15 minutes prior to your appointment time. Thank you. Contact information: [address] [place] [address] [phone]         Clovis Riley, MD. Go on 12/21/2021.   Specialty: General Surgery Why: at 9:10am.  Please arrive 15 minutes prior to your appointment time.  Thank you. Contact information: [address] [place] [address] [phone]                Discharge Diagnoses:  Principal Problem:   Morbid obesity ([place])   Surgical Procedure: Laparoscopic Sleeve Gastrectomy, upper endoscopy  Discharge Condition: Good Disposition: Home  Diet recommendation: Postoperative sleeve gastrectomy diet (liquids only)  Filed Weights   10/24/21 0541 10/24/21 2222  Weight: 100 kg 104.6 kg     Hospital Course:  The patient was admitted for a planned laparoscopic sleeve gastrectomy. Please see operative note. Preoperatively the patient was given 5000 units of subcutaneous heparin for DVT prophylaxis. Postoperative prophylactic heparin dosing was started on the evening of postoperative day 0. ERAS protocol was used. On the evening of postoperative day 0, the patient was started on water and ice chips. On postoperative day 1 the patient had no fever or tachycardia and was tolerating water in their diet was gradually advanced throughout the day. The patient was ambulating without difficulty. Their vital signs are stable without fever or tachycardia. Hemoglobin did drop by 2 points, but this was rechecked in the afternoon and had actually come up a point. The patient had received discharge  instructions and counseling. They were deemed stable for discharge and had met discharge criteria   Discharge Instructions   Allergies as of 10/25/2021       Reactions   Lisinopril Cough   Adhesive [tape] Itching, Rash   Itching and breaks out   Silicone Itching, Rash   Itching and breaks out        Medication List     STOP taking these medications    aspirin EC 81 MG tablet       TAKE these medications    acetaminophen 500 MG tablet Commonly known as: TYLENOL Take 500 mg by mouth every 6 (six) hours as needed for moderate pain or mild pain. What changed: Another medication with the same name was added. Make sure you understand how and when to take each.   acetaminophen 500 MG tablet Commonly known as: TYLENOL Take 2 tablets (1,000 mg total) by mouth every 8 (eight) hours for 5 days. What changed: You were already taking a medication with the same name, and this prescription was added. Make sure you understand how and when to take each.   docusate sodium 100 MG capsule Commonly known as: Colace Take 1 capsule (100 mg total) by mouth 2 (two) times daily. Okay to decrease to once daily or stop taking if having loose bowel movements   Entresto 24-26 MG Generic drug: sacubitril-valsartan TAKE 1 TABLET BY MOUTH TWICE A DAY   escitalopram 20 MG tablet Commonly known as: LEXAPRO Take 20 mg by mouth daily.   gabapentin 100 MG capsule Commonly known as: NEURONTIN Take 2 capsules (200 mg total) by mouth every  12 (twelve) hours.   Magnesium Oxide 250 MG Tabs Take 500 mg by mouth at bedtime.   nitroGLYCERIN 0.4 MG SL tablet Commonly known as: NITROSTAT PLACE 1 TABLET (0.4 MG TOTAL) UNDER THE TONGUE EVERY FIVE MINUTES AS NEEDED FOR CHEST PAIN.   ondansetron 4 MG disintegrating tablet Commonly known as: ZOFRAN-ODT Take 1 tablet (4 mg total) by mouth every 6 (six) hours as needed for nausea or vomiting.   pantoprazole 40 MG tablet Commonly known as: PROTONIX Take  1 tablet (40 mg total) by mouth daily.   traMADol 50 MG tablet Commonly known as: ULTRAM Take 1 tablet (50 mg total) by mouth every 6 (six) hours as needed (pain).        Follow-up Information     Clovis Riley, MD. Go on 11/14/2021.   Specialty: General Surgery Why: at 3pm. Please arrive 15 minutes prior to your appointment time. Thank you. Contact information: [address] [place] [address] [phone]         Clovis Riley, MD. Go on 12/21/2021.   Specialty: General Surgery Why: at 9:10am.  Please arrive 15 minutes prior to your appointment time.  Thank you. Contact information: [address] [place] [address] [phone]                  The results of significant diagnostics from this hospitalization (including imaging, microbiology, ancillary and laboratory) are listed below for reference.    Significant Diagnostic Studies: No results found.  Labs: Basic Metabolic Panel: Recent Labs  Lab 10/25/21 0439  NA 145  K 3.7  CL 118*  CO2 23  GLUCOSE 84  BUN 12  CREATININE 0.64  CALCIUM 7.1*  MG 1.6*   Liver Function Tests: Recent Labs  Lab 10/25/21 0439  AST 14*  ALT 15  ALKPHOS 47  BILITOT 0.4  PROT 4.8*  ALBUMIN 2.7*    CBC: Recent Labs  Lab 10/24/21 1219 10/25/21 0439 10/25/21 1439  WBC  --  8.6  --   HGB 13.0 11.0* 12.0  HCT 39.4 34.7* 37.4  MCV  --  96.7  --   PLT  --  202  --     CBG: Recent Labs  Lab 10/24/21 0539  GLUCAP 97    Principal Problem:   Morbid obesity ([place])  Signed:  Clovis Riley MD [facility], [place] 10/25/2021, 4:08 PM

## 2021-10-25 NOTE — Progress Notes (Signed)
Mobility Specialist - Progress Note   10/25/21 0945  Mobility  HOB Elevated/Bed Position Self regulated  Activity Ambulated with assistance in hallway  Range of Motion/Exercises Active  Level of Assistance Contact guard assist, steadying assist  Assistive Device  (HHA)  Distance Ambulated (ft) 250 ft  Activity Response Tolerated well  $Mobility charge 1 Mobility   Pt was found in recliner chair and agreeable to mobilize. Was IND first half of walk but began to feel fatigued and needed assistance the last half. At EOS returned to bed with all necessities in reach.  Ferd Hibbs Mobility Specialist

## 2021-10-25 NOTE — Progress Notes (Signed)

## 2021-10-25 NOTE — TOC Initial Note (Signed)
Transition of Care [facility]) - Initial/Assessment Note    Patient Details  Name: Traci Roy MRN: [id number] Date of Birth: 1960-12-04  Transition of [facility]) CM/SW Contact:    Leeroy Cha, RN Phone Number: 10/25/2021, 7:54 AM  Clinical Narrative:                  Transition of Care [facility]) Screening Note   Patient Details  Name: Traci Roy Date of Birth: Oct 07, 1960   Transition of Care [facility]) CM/SW Contact:    Leeroy Cha, RN Phone Number: 10/25/2021, 7:54 AM    Transition of Care Department [facility]) has reviewed patient and no TOC needs have been identified at this time. We will continue to monitor patient advancement through interdisciplinary progression rounds. If new patient transition needs arise, please place a TOC consult.    Expected Discharge Plan: Home/Self Care Barriers to Discharge: Continued Medical Work up   Patient Goals and CMS Choice Patient states their goals for this hospitalization and ongoing recovery are:: to lose this weight CMS Medicare.gov Compare Post Acute Care list provided to:: Patient    Expected Discharge Plan and Services Expected Discharge Plan: Home/Self Care   Discharge Planning Services: CM Consult   Living arrangements for the past 2 months: Single Family Home                                      Prior Living Arrangements/Services Living arrangements for the past 2 months: Single Family Home Lives with:: Spouse Patient language and need for interpreter reviewed:: Yes Do you feel safe going back to the place where you live?: Yes            Criminal Activity/Legal Involvement Pertinent to Current Situation/Hospitalization: No - Comment as needed  Activities of Daily Living Home Assistive Devices/Equipment: Blood pressure cuff, Eyeglasses ADL Screening (condition at time of admission) Patient's cognitive ability adequate to safely complete daily activities?: Yes Is the patient deaf or have  difficulty hearing?: Yes Does the patient have difficulty seeing, even when wearing glasses/contacts?: No Does the patient have difficulty concentrating, remembering, or making decisions?: Yes Patient able to express need for assistance with ADLs?: Yes Does the patient have difficulty dressing or bathing?: No Independently performs ADLs?: Yes (appropriate for developmental age) Does the patient have difficulty walking or climbing stairs?: No Weakness of Legs: None Weakness of Arms/Hands: None  Permission Sought/Granted                  Emotional Assessment Appearance:: Appears stated age Attitude/Demeanor/Rapport: Engaged Affect (typically observed): Calm Orientation: : Oriented to Self, Oriented to Place, Oriented to  Time, Oriented to Situation Alcohol / Substance Use: Not Applicable Psych Involvement: No (comment)  Admission diagnosis:  Morbid obesity ([place]) [E66.01] Patient Active Problem List   Diagnosis Date Noted   Preop cardiovascular exam 06/23/2021   Morbid obesity ([place]) 04/10/2021   Type 2 diabetes mellitus with complication, without long-term current use of insulin ([place]) 04/10/2021   CAD (coronary artery disease) 11/06/2020   Left ventricular dysfunction 11/06/2020   Hyperlipidemia 11/06/2020   Angina pectoris ([place])    Essential hypertension 10/22/2019   Pneumonia due to COVID-19 virus 10/22/2019   Acute respiratory failure with hypoxia ([place]) 10/22/2019   AKI (acute kidney injury) ([place]) 10/22/2019   Hypokalemia 10/22/2019   Degenerative joint disease (DJD) of hip 04/14/2013   PCP:  Jonathon Jordan,  MD Pharmacy:   [id number] [place], [place] - [address] [place] [address]Phone: [phone]: [phone]    Social Determinants of Health (SDOH) Interventions Food Insecurity Interventions: Intervention Not Indicated Housing Interventions: Intervention Not Indicated Transportation Interventions: Intervention Not  Indicated Utilities Interventions: Intervention Not Indicated  Readmission Risk Interventions   No data to display

## 2021-10-25 NOTE — Plan of Care (Signed)
  Problem: Education: Goal: Ability to state signs and symptoms to report to health care provider will improve Outcome: Adequate for Discharge Goal: Knowledge of the prescribed self-care regimen will improve Outcome: Adequate for Discharge Goal: Knowledge of discharge needs will improve Outcome: Adequate for Discharge   Problem: Activity: Goal: Ability to tolerate increased activity will improve Outcome: Adequate for Discharge   Problem: Bowel/Gastric: Goal: Gastrointestinal status for postoperative course will improve Outcome: Adequate for Discharge Goal: Occurrences of nausea will decrease Outcome: Adequate for Discharge   Problem: Coping: Goal: Development of coping mechanisms to deal with changes in body function or appearance will improve Outcome: Adequate for Discharge   Problem: Fluid Volume: Goal: Maintenance of adequate hydration will improve Outcome: Adequate for Discharge   Problem: Nutritional: Goal: Nutritional status will improve Outcome: Adequate for Discharge   Problem: Clinical Measurements: Goal: Will show no signs or symptoms of venous thromboembolism Outcome: Adequate for Discharge Goal: Will remain free from infection Outcome: Adequate for Discharge Goal: Will show no signs of GI Leak Outcome: Adequate for Discharge   Problem: Respiratory: Goal: Will regain and/or maintain adequate ventilation Outcome: Adequate for Discharge   Problem: Pain Management: Goal: Pain level will decrease Outcome: Adequate for Discharge   Problem: Skin Integrity: Goal: Demonstration of wound healing without infection will improve Outcome: Adequate for Discharge   Problem: Education: Goal: Knowledge of General Education information will improve Description: Including pain rating scale, medication(s)/side effects and non-pharmacologic comfort measures Outcome: Adequate for Discharge   Problem: Health Behavior/Discharge Planning: Goal: Ability to manage  health-related needs will improve Outcome: Adequate for Discharge   Problem: Clinical Measurements: Goal: Ability to maintain clinical measurements within normal limits will improve Outcome: Adequate for Discharge Goal: Will remain free from infection Outcome: Adequate for Discharge Goal: Diagnostic test results will improve Outcome: Adequate for Discharge Goal: Respiratory complications will improve Outcome: Adequate for Discharge Goal: Cardiovascular complication will be avoided Outcome: Adequate for Discharge   Problem: Activity: Goal: Risk for activity intolerance will decrease Outcome: Adequate for Discharge   Problem: Nutrition: Goal: Adequate nutrition will be maintained Outcome: Adequate for Discharge   Problem: Coping: Goal: Level of anxiety will decrease Outcome: Adequate for Discharge   Problem: Elimination: Goal: Will not experience complications related to bowel motility Outcome: Adequate for Discharge Goal: Will not experience complications related to urinary retention Outcome: Adequate for Discharge   Problem: Pain Managment: Goal: General experience of comfort will improve Outcome: Adequate for Discharge   Problem: Safety: Goal: Ability to remain free from injury will improve Outcome: Adequate for Discharge   Problem: Skin Integrity: Goal: Risk for impaired skin integrity will decrease Outcome: Adequate for Discharge

## 2021-10-25 NOTE — Progress Notes (Signed)
S: Uneventful night.  She is complaining of pain underneath her left ribs, but no nausea, no dysphagia, tolerating liquids well.  O: Vitals, labs, intake/output, and orders reviewed at this time. Afebrile, no tachycardia, normotensive. Sats 100% RA. PO 540, UOP 1900 + 3 voids. CMP- mag 1.6, K 3.7. CBC- wbc 8.6, Hgb 11 (13 yesterday, 14.6 preop), plt 202 (249 preop).  All of her labs look fairly diluted compared to the labs she had done 2 weeks ago including creatinine of 0.6 down from 0.9.   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 -We will recheck hemoglobin this afternoon.  No clinical concern for active or significant blood loss given normal vital signs and benign abdominal exam.  Prophylactic Lovenox recommended on discharge due to 1% risk of perioperative VTE, but will hold off on this given labs.  If hemoglobin stable this afternoon and she remains clinically well anticipate discharge this afternoon.   Romana Juniper, MD [facility], [place]

## 2021-10-25 NOTE — Progress Notes (Signed)
Pt was discharged home today. Instructions were reviewed with patient, and questions were answered. Pt was taken to main entrance via wheelchair by NT.  

## 2021-10-27 ENCOUNTER — Telehealth (HOSPITAL_COMMUNITY): Payer: Self-pay | Admitting: *Deleted

## 2021-10-27 NOTE — Telephone Encounter (Signed)
1.  Tell me about your pain and pain management? Pt denies any pain, just states that she is sore..  2.  Let's talk about fluid intake.  How much total fluid are you taking in? Pt states that she is working to meet goal of 64 oz of fluid today.  Pt states that she is getting in at least 45oz of fluid including protein shakes, bottled water, broth, cream soup and tea.  Pt instructed to assess status and suggestions daily utilizing Hydration Action Plan on discharge folder and to call [facility] if in the "red zone".   3.  How much protein have you taken in the last 2 days? Pt states she is meeting her goal of60g of protein each day with the protein shakes.  4.  Have you had nausea?  Tell me about when have experienced nausea and what you did to help? Pt denies nausea.   5.  Has the frequency or color changed with your urine? Pt states that she is urinating "fine" with no changes in frequency or urgency.     6.  Tell me what your incisions look like? Pt has showered, but not removed gauze covered tegaderm.  Instructed pt to remove outer bandages (removed during our call) and leave incisions open to air and monitor for any abnormal symptoms."Incisions look fine". Pt denies a fever, chills.  Pt states incisions are not swollen, open, or draining.  Pt encouraged to call [facility] if incisions change.   7.  Have you been passing gas? BM? Pt states that she is having BMs. Last BM 10/26/21.     8.  If a problem or question were to arise who would you call?  Do you know contact numbers for [facility] [place], [facility], and [facility]? Pt denies dehydration symptoms.  Pt can describe s/sx of dehydration.  Pt knows to call [facility] for surgical, [facility] for nutrition, and [facility] for non-urgent questions or concerns.   9.  How has the walking going? Pt states she is walking around and able to be active without difficulty.   10. Are you still using your incentive spirometer?  If so, how often? Pt states that s/e forgot the I.S. at discharge.   Discussed with the pt about TCDB and being intentional about performing the lung exercise at least 10x every hour while awake until she sees the surgeon.  11.  How are your vitamins and calcium going?  How are you taking them? Pt states that she is taking her supplements and vitamins without difficulty.  Reinforced education about taking supplements at least two hours apart. Reinforced education about patient taking prescribed medications, one tablet at a time, at least 10-15 minutes apart.     Reminded patient that the first 30 days post-operatively are important for successful recovery.  Practice good hand hygiene, wearing a mask when appropriate (since optional in most places), and minimizing exposure to people who live outside of the home, especially if they are exhibiting any respiratory, GI, or illness-like symptoms.

## 2021-11-07 ENCOUNTER — Encounter: Payer: BC Managed Care – PPO | Attending: Surgery | Admitting: Dietician

## 2021-11-07 ENCOUNTER — Encounter: Payer: Self-pay | Admitting: Dietician

## 2021-11-07 DIAGNOSIS — Z713 Dietary counseling and surveillance: Secondary | ICD-10-CM | POA: Insufficient documentation

## 2021-11-07 DIAGNOSIS — Z6837 Body mass index (BMI) 37.0-37.9, adult: Secondary | ICD-10-CM | POA: Insufficient documentation

## 2021-11-07 DIAGNOSIS — E669 Obesity, unspecified: Secondary | ICD-10-CM | POA: Insufficient documentation

## 2021-11-07 DIAGNOSIS — E118 Type 2 diabetes mellitus with unspecified complications: Secondary | ICD-10-CM | POA: Insufficient documentation

## 2021-11-07 NOTE — Progress Notes (Addendum)
2 Week Post-Operative Nutrition Class   Patient was seen on 11/07/2021 for Post-Operative Nutrition education at the [facility].    Surgery date: 10/24/2021 Surgery type: Sleeve  NUTRITION ASSESSMENT   Anthropometrics  Start weight at [facility]: 225.9 lbs (date: 08/01/2021)  Height: 63 in Weight today: 209.8 lbs BMI: 37.16 kg/m2     Clinical  Medical hx: HTN, Obesity, Arthritis, CHF, Depression, DM, GERD Medications: Escitalopram, magnesium, low dose aspirin, amoxicillin, entresto, probiotic  Labs: 03/07/2021: glucose 129;  Notable signs/symptoms: none noted Any previous deficiencies? No Bowel Habits: Every day to every other day no complaints    Body Composition Scale 11/07/2021  Current Body Weight 209.8  Total Body Fat % 43.5  Visceral Fat 15  Fat-Free Mass % 56.4   Total Body Water % 42.7  Muscle-Mass lbs 28.5  BMI 36.7  Body Fat Displacement          Torso  lbs 56.4         Left Leg  lbs 11.2         Right Leg  lbs 11.2         Left Arm  lbs 5.6         Right Arm   lbs 5.6      The following the learning objectives were met by the patient during this course: Identifies Phase 3 (Soft, High Proteins) Dietary Goals and will begin from 2 weeks post-operatively to 2 months post-operatively Identifies appropriate sources of fluids and proteins  Identifies appropriate fat sources and healthy verses unhealthy fat types   States protein recommendations and appropriate sources post-operatively Identifies the need for appropriate texture modifications, mastication, and bite sizes when consuming solids Identifies appropriate fat consumption and sources Identifies appropriate multivitamin and calcium sources post-operatively Describes the need for physical activity post-operatively and will follow MD recommendations States when to call healthcare provider regarding medication questions or post-operative complications   Handouts given during class  include: Phase 3A: Soft, High Protein Diet Handout Phase 3 High Protein Meals Healthy Fats   Follow-Up Plan: Patient will follow-up at [facility] in 6 weeks for 2 month post-op nutrition visit for diet advancement per MD.

## 2021-11-10 ENCOUNTER — Telehealth: Payer: Self-pay | Admitting: Dietician

## 2021-11-10 NOTE — Telephone Encounter (Signed)
Pt called asking about nuts, stating they are high in fat. Dietitian advised to stick with the serving size of 1/4 cup, limit serving to one a day and make sure to chew well.  Pt agreeable to instructions.

## 2021-11-13 ENCOUNTER — Telehealth: Payer: Self-pay | Admitting: Dietician

## 2021-11-13 NOTE — Telephone Encounter (Signed)
RD called pt to verify fluid intake once starting soft, solid proteins 2 week post-bariatric surgery.   Daily Fluid intake: 64 oz Daily Protein intake: 60 grams (supplement with shakes) Bowel Habits: no issues  Concerns/issues:  Pt states she is tolerating food. Pt concerned she is not losing weight. Pt states she is eating: egg, cheese: lunch meat, cheese, mayo; Velveeta cheese, ground beef, may, green beans. Pt states she has an appointment with surgeon tomorrow.  Dietitian offered an earlier follow-up appointment if wt loss continues to plateau.

## 2021-11-14 ENCOUNTER — Ambulatory Visit
Admission: RE | Admit: 2021-11-14 | Discharge: 2021-11-14 | Disposition: A | Discharge status: Home / Self Care | Payer: BC Managed Care – PPO | Source: Ambulatory Visit | Attending: Family Medicine | Admitting: Family Medicine

## 2021-11-14 DIAGNOSIS — N6011 Diffuse cystic mastopathy of right breast: Secondary | ICD-10-CM | POA: Diagnosis not present

## 2021-11-14 DIAGNOSIS — N6323 Unspecified lump in the left breast, lower outer quadrant: Secondary | ICD-10-CM | POA: Diagnosis not present

## 2021-11-14 DIAGNOSIS — N6313 Unspecified lump in the right breast, lower outer quadrant: Secondary | ICD-10-CM

## 2021-11-14 DIAGNOSIS — R92323 Mammographic fibroglandular density, bilateral breasts: Secondary | ICD-10-CM | POA: Diagnosis not present

## 2021-11-15 ENCOUNTER — Other Ambulatory Visit (HOSPITAL_COMMUNITY)
Admission: RE | Admit: 2021-11-15 | Discharge: 2021-11-15 | Disposition: A | Discharge status: Home / Self Care | Payer: BC Managed Care – PPO | Source: Ambulatory Visit | Attending: Family Medicine | Admitting: Family Medicine

## 2021-11-15 ENCOUNTER — Telehealth: Payer: Self-pay | Admitting: Skilled Nursing Facility1

## 2021-11-15 ENCOUNTER — Other Ambulatory Visit: Payer: Self-pay | Admitting: Family Medicine

## 2021-11-15 DIAGNOSIS — R202 Paresthesia of skin: Secondary | ICD-10-CM | POA: Diagnosis not present

## 2021-11-15 DIAGNOSIS — R739 Hyperglycemia, unspecified: Secondary | ICD-10-CM | POA: Diagnosis not present

## 2021-11-15 DIAGNOSIS — E119 Type 2 diabetes mellitus without complications: Secondary | ICD-10-CM | POA: Diagnosis not present

## 2021-11-15 DIAGNOSIS — E785 Hyperlipidemia, unspecified: Secondary | ICD-10-CM | POA: Diagnosis not present

## 2021-11-15 DIAGNOSIS — Z0000 Encounter for general adult medical examination without abnormal findings: Secondary | ICD-10-CM | POA: Diagnosis not present

## 2021-11-15 DIAGNOSIS — Z01419 Encounter for gynecological examination (general) (routine) without abnormal findings: Secondary | ICD-10-CM | POA: Diagnosis not present

## 2021-11-15 DIAGNOSIS — I10 Essential (primary) hypertension: Secondary | ICD-10-CM | POA: Diagnosis not present

## 2021-11-15 NOTE — Telephone Encounter (Signed)
Returned pt's call. LVM.

## 2021-11-17 ENCOUNTER — Telehealth: Payer: Self-pay | Admitting: Dietician

## 2021-11-17 ENCOUNTER — Encounter: Payer: Self-pay | Admitting: Cardiology

## 2021-11-17 ENCOUNTER — Telehealth: Payer: Self-pay | Admitting: Cardiology

## 2021-11-17 NOTE — Telephone Encounter (Signed)
Pt called asking when vegetables will be added, and asked why the current phase went straight into solid protein.  Dietitian advised that non-starchy vegetables will be introduced at the next visit (12/19/2021). Dietitian reminded pt of information that was covered in the post op class, of proteins to start off with that are easier to tolerate, for example, plant based protein, fish, dairy.  Pt states she is tolerating the proteins well, stating she is bored with them. Pt also, expressed concern that her A1c was check earlier this week and it went up from 5.6 to 5.9, stating that does not make sense to her since she is not eating carbs since surgery (10/24/2021).  Dietitian explained that the A1c is a 3 month average, stating much of that value is from pre-surgery.  Pt is frustrated with a small amount of wt loss since surgery, stating she has lost only 13 lbs since surgery.  Pt states that the surgeon explained since she lost some weight before surgery, her wet loss may be slower following surgery.  Dietitian agreed. Dietitian congratulated pt on current wt loss.  Pt stated appreciation for the information.

## 2021-11-17 NOTE — Telephone Encounter (Signed)
Pt has sent a MyChart message as well.  Please see that encounter for further documentation.

## 2021-11-17 NOTE — Telephone Encounter (Signed)
"  Hey Dr. Marlou Porch, I'm 3 weeks post-op and everything seems to be ok, however, tried to get out of bed this morning and got so dizzy, I became extremely nauseated. Cold sweat. I think my blood pressure is starting to run on the low side. I'm needing a refill on entresto and wondering if the blood pressure med attached to it should be stopped?"    Pt calling about mychart sent this morning

## 2021-11-21 LAB — CYTOLOGY - PAP
Comment: NEGATIVE
Diagnosis: NEGATIVE
High risk HPV: NEGATIVE

## 2021-11-30 ENCOUNTER — Telehealth: Payer: Self-pay | Admitting: Dietician

## 2021-11-30 NOTE — Telephone Encounter (Signed)
Pt called asking what she can eat.  Dietitian reminded pt of information that was covered in the post op class. Pt stated she was frustrated with a small amount of wt loss since surgery, stating she has lost only 13 lbs since surgery.  It was discussed what the pt had stated on the last call about the surgeon explained since she lost some weight before surgery, her wet loss may be slower following surgery. Dietitian agreed. Dietitian congratulated pt on current wt loss. Dietitian offered pt to call the front office and get a sooner appointment to advance to non-starchy vegetables. Pt states she can wait until her scheduled appointment. Pt stated appreciation for the information.

## 2021-12-19 ENCOUNTER — Ambulatory Visit: Payer: BC Managed Care – PPO | Attending: Cardiology | Admitting: Cardiology

## 2021-12-19 ENCOUNTER — Encounter: Payer: BC Managed Care – PPO | Attending: Surgery | Admitting: Skilled Nursing Facility1

## 2021-12-19 ENCOUNTER — Encounter: Payer: Self-pay | Admitting: Cardiology

## 2021-12-19 ENCOUNTER — Encounter: Payer: Self-pay | Admitting: Skilled Nursing Facility1

## 2021-12-19 VITALS — BP 110/70 | HR 73 | Ht 63.0 in | Wt 199.0 lb

## 2021-12-19 DIAGNOSIS — E669 Obesity, unspecified: Secondary | ICD-10-CM | POA: Diagnosis not present

## 2021-12-19 DIAGNOSIS — I2510 Atherosclerotic heart disease of native coronary artery without angina pectoris: Secondary | ICD-10-CM

## 2021-12-19 DIAGNOSIS — I10 Essential (primary) hypertension: Secondary | ICD-10-CM

## 2021-12-19 DIAGNOSIS — E118 Type 2 diabetes mellitus with unspecified complications: Secondary | ICD-10-CM | POA: Insufficient documentation

## 2021-12-19 MED ORDER — ASPIRIN 81 MG PO TBEC: 81.0000 mg | DELAYED_RELEASE_TABLET | Freq: Every day | ORAL | 12 refills | Status: AC

## 2021-12-19 NOTE — Progress Notes (Signed)
Cardiology Office Note:    Date:  12/19/2021   ID:  Traci Roy, DOB 1961-01-25, MRN [id number]  PCP:  Jonathon Jordan, MD   [facility] Providers Cardiologist:  Candee Furbish, MD     Referring MD: Jonathon Jordan, MD    History of Present Illness:    Traci Roy is a 61 y.o. female here for preoperative sleeve gastrectomy cardiac evaluation.  Follow-up coronary artery disease with prior LAD stent placement in November 2021.  EF at that time was 40-45%.  She has been battling with weight loss.  Saw Dr. Windle Guard general surgery-12/23.  Notes reviewed.  She went for bariatric surgery evaluation.  She discussed particularly sleeve gastrectomy.  We previously discussed her diagnosis of chronic systolic heart failure.  She was previously NYHA class I.  She was concerned about her mortality.  Reassurance was given given her NYHA class and current medication strategy.  Today, she is overall feeling well. She raised a concern of struggling to lose weight.  She reported that before her surgery she was told to stop taking her aspirin.   She denies any palpitations, chest pain, shortness of breath, or peripheral edema. No lightheadedness, headaches, syncope, orthopnea, or PND.  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   CHF (congestive heart failure) ([place])    Depression    takes Lexapro daily   Diabetes mellitus without complication ([name])    Dizziness    occasionally and no meds   Flu    end of Dec 2014   GERD (gastroesophageal reflux disease)    occasionally and no meds required   History of bronchitis    last time many yrs ago   History of duodenal ulcer    History of shingles 3-5 YRS AGO   Hypertension    takes Losartan and HCTZ daily   Joint pain    Restless legs     Past Surgical History:  Procedure Laterality Date   BREAST ENHANCEMENT SURGERY Bilateral    CESAREAN SECTION     X 1    COLONOSCOPY     CORONARY STENT INTERVENTION N/A 12/18/2019   Procedure: CORONARY STENT INTERVENTION;  Surgeon: Jettie Booze, MD;  Location: [facility];  Service: Cardiovascular;  Laterality: N/A;   enlarged bladder as a child     HAD SURGERY TO ENLARGE BLADDER   INTRAVASCULAR PRESSURE WIRE/FFR STUDY N/A 12/18/2019   Procedure: INTRAVASCULAR PRESSURE WIRE/FFR STUDY;  Surgeon: Jettie Booze, MD;  Location: [facility];  Service: Cardiovascular;  Laterality: N/A;   LAPAROSCOPIC GASTRIC SLEEVE RESECTION N/A 10/24/2021   Procedure: LAPAROSCOPIC SLEEVE GASTRECTOMY WITH HIATAL HERNIA REPAIR;  Surgeon: Clovis Riley, MD;  Location: [facility];  Service: General;  Laterality: N/A;   RIGHT/LEFT HEART CATH AND CORONARY ANGIOGRAPHY N/A 12/18/2019   Procedure: RIGHT/LEFT HEART CATH AND CORONARY ANGIOGRAPHY;  Surgeon: Jettie Booze, MD;  Location: [facility];  Service: Cardiovascular;  Laterality: N/A;   TONSILLECTOMY     TOTAL HIP ARTHROPLASTY Right 04/14/2013   Procedure: TOTAL HIP ARTHROPLASTY ANTERIOR APPROACH;  Surgeon: Hessie Dibble, MD;  Location: [facility];  Service: Orthopedics;  Laterality: Right;   UPPER GI ENDOSCOPY N/A 10/24/2021   Procedure: UPPER GI ENDOSCOPY;  Surgeon: Clovis Riley, MD;  Location: [facility];  Service: General;  Laterality: N/A;    Current Medications: Current  Meds  Medication Sig   acetaminophen (TYLENOL) 500 MG tablet Take 500 mg by mouth every 6 (six) hours as needed for moderate pain or mild pain.   aspirin EC 81 MG tablet Take 1 tablet (81 mg total) by mouth daily. Swallow whole.   escitalopram (LEXAPRO) 20 MG tablet Take 20 mg by mouth daily.   gabapentin (NEURONTIN) 100 MG capsule Take 2 capsules (200 mg total) by mouth every 12 (twelve) hours.   nitroGLYCERIN (NITROSTAT) 0.4 MG SL tablet PLACE 1 TABLET (0.4 MG TOTAL) UNDER THE TONGUE EVERY FIVE MINUTES AS NEEDED FOR CHEST PAIN.   pantoprazole (PROTONIX) 40 MG tablet Take 1  tablet (40 mg total) by mouth daily.   sacubitril-valsartan (ENTRESTO) 24-26 MG TAKE 1 TABLET BY MOUTH TWICE A DAY     Allergies:   Lisinopril, Adhesive [tape], and Silicone   Social History   Socioeconomic History   Marital status: Married    Spouse name: Not on file   Number of children: Not on file   Years of education: Not on file   Highest education level: Not on file  Occupational History   Not on file  Tobacco Use   Smoking status: Never   Smokeless tobacco: Never  Vaping Use   Vaping Use: Never used  Substance and Sexual Activity   Alcohol use: Yes    Comment: glass of wine every now and then   Drug use: No   Sexual activity: Yes    Birth control/protection: Other-see comments  Other Topics Concern   Not on file  Social History Narrative   Not on file   Social Determinants of Health   Financial Resource Strain: Not on file  Food Insecurity: No Food Insecurity (10/24/2021)   Hunger Vital Sign    Worried About Running Out of Food in the Last Year: Never true    Ran Out of Food in the Last Year: Never true  Transportation Needs: No Transportation Needs (10/24/2021)   PRAPARE - Hydrologist (Medical): No    Lack of Transportation (Non-Medical): No  Physical Activity: Not on file  Stress: Not on file  Social Connections: Not on file     Family History: The patient's Family history is unknown by patient.  ROS:   Please see the history of present illness.     All other systems reviewed and are negative.  EKGs/Labs/Other Studies Reviewed:    The following studies were reviewed today:  ECHO 10/28/2020  1. Left ventricular ejection fraction, by estimation, is 40 to 45%. The  left ventricle has mildly decreased function. The left ventricle  demonstrates global hypokinesis. The left ventricular internal cavity size  was mildly dilated. Left ventricular  diastolic parameters are consistent with Grade I diastolic dysfunction   (impaired relaxation).   2. Right ventricular systolic function is normal. The right ventricular  size is normal.   3. The mitral valve is normal in structure. Mild mitral valve  regurgitation. No evidence of mitral stenosis.   4. The aortic valve has an indeterminant number of cusps. Aortic valve  regurgitation is not visualized. No aortic stenosis is present.   5. The inferior vena cava is normal in size with greater than 50%  respiratory variability, suggesting right atrial pressure of 3 mmHg.   Comparison(s): 12/02/2019 40-45%.   Cardiac Cath 12/18/2019: 2nd Diag lesion is 50% stenosed. Mid LAD lesion is 70% stenosed. Significant by DFR. A drug-eluting stent was successfully placed using a STENT  RESOLUTE ONYX 3.0X26, postdilated to 3.75. Post intervention, there is a 0% residual stenosis. The left ventricular ejection fraction is 50-55% by visual estimate. LV end diastolic pressure is normal. The left ventricular systolic function is normal. There is no aortic valve stenosis. Ao sat 97%, PA sat 72%, PA pressure 35/14, mean PA pressure 24 mm Hg, mean PCWP 11 mm Hg; CO 6.3 L/min; CI 3.01 Normal right heart pressures. EBU 3.5 would have been a better choice of guide catheter.   Continue dual antiplatelet therapy along with aggressive secondary prevention  EKG:  EKG is personally reviewed and interpreted.  12/19/2021: EKG was not ordered. 06/23/2021: EKG is ordered today.  The ekg ordered today demonstrates normal sinus rhythm 83 with poor R wave progression nonspecific T wave changes.  No change from prior.  Prior EKG shows sinus rhythm with poor R wave progression   Recent Labs: 10/25/2021: ALT 15; BUN 12; Creatinine, Ser 0.64; Hemoglobin 12.0; Magnesium 1.6; Platelets 202; Potassium 3.7; Sodium 145  Recent Lipid Panel No results found for: "CHOL", "TRIG", "HDL", "CHOLHDL", "VLDL", "LDLCALC", "LDLDIRECT"   Risk Assessment/Calculations:              Physical Exam:     VS:  BP 110/70 (BP Location: Left Arm, Patient Position: Sitting, Cuff Size: Normal)   Pulse 73   Ht '5\' 3"'$  (1.6 m)   Wt 199 lb (90.3 kg)   LMP 02/05/2014   SpO2 98%   BMI 35.25 kg/m     Wt Readings from Last 3 Encounters:  12/19/21 199 lb (90.3 kg)  11/07/21 209 lb 12.8 oz (95.2 kg)  10/24/21 230 lb 9.6 oz (104.6 kg)     GEN:  Well nourished, well developed in no acute distress HEENT: Normal NECK: No JVD; No carotid bruits LYMPHATICS: No lymphadenopathy CARDIAC:  RRR, no murmurs, no rubs, gallops RESPIRATORY:  Clear to auscultation without rales, wheezing or rhonchi  ABDOMEN: Soft, non-tender, non-distended MUSCULOSKELETAL:  No edema; No deformity  SKIN: Warm and dry NEUROLOGIC:  Alert and oriented x 3 PSYCHIATRIC:  Normal affect   ASSESSMENT:    1. Coronary artery disease involving native coronary artery of native heart without angina pectoris   2. Essential hypertension     PLAN:    In order of problems listed above:   Left ventricular dysfunction Ejection fraction 45%.  Entresto.  Doing well.  Blood pressure excellent.  No symptoms.  NYHA class I.  No changes made.  Stable.  Answered several questions.   CAD (coronary artery disease) Prior LAD stent in 2021.  I would like for her to resume her aspirin 81 mg.  She has been off of this medication since her gastric bypass.  No anginal symptoms.   Type 2 diabetes mellitus with complication, without long-term current use of insulin (HCC) Stable, doing well.   Hyperlipidemia Continue with atorvastatin 40 mg a day.  LDL 105.  Ultimate goal would be less than 70.  Gastric sleeve Excellent job with weight loss  Follow up: 1 year    Medication Adjustments/Labs and Tests Ordered: Current medicines are reviewed at length with the patient today.  Concerns regarding medicines are outlined above.  No orders of the defined types were placed in this encounter.  Meds ordered this encounter  Medications   aspirin EC  81 MG tablet    Sig: Take 1 tablet (81 mg total) by mouth daily. Swallow whole.    Dispense:  30 tablet    Refill:  12  Patient Instructions  Medication Instructions:  Your physician has recommended you make the following change in your medication:  RESTART: Aspirin 81 mg by mouth once daily  *If you need a refill on your cardiac medications before your next appointment, please call your pharmacy*   Lab Work: NONE If you have labs (blood work) drawn today and your tests are completely normal, you will receive your results only by: [place] (if you have MyChart) OR A paper copy in the mail If you have any lab test that is abnormal or we need to change your treatment, we will call you to review the results.   Testing/Procedures: NONE   Follow-Up: At [facility], you and your health needs are our priority.  As part of our continuing mission to provide you with exceptional heart care, we have created designated Provider Care Teams.  These Care Teams include your primary Cardiologist (physician) and Advanced Practice Providers (APPs -  Physician Assistants and Nurse Practitioners) who all work together to provide you with the care you need, when you need it.  We recommend signing up for the patient portal called "MyChart".  Sign up information is provided on this After Visit Summary.  MyChart is used to connect with patients for Virtual Visits (Telemedicine).  Patients are able to view lab/test results, encounter notes, upcoming appointments, etc.  Non-urgent messages can be sent to your provider as well.   To learn more about what you can do with MyChart, go to [email or web address].    Your next appointment:   1 year(s)  The format for your next appointment:   In Person  Provider:   Candee Furbish, MD      Important Information About Sugar          I,Rachel Rivera,acting as a scribe for Candee Furbish, MD.,have documented all relevant documentation on the  behalf of Candee Furbish, MD,as directed by  Candee Furbish, MD while in the presence of Candee Furbish, MD.  I, Candee Furbish, MD, have reviewed all documentation for this visit. The documentation on 12/19/21 for the exam, diagnosis, procedures, and orders are all accurate and complete.   Signed, Candee Furbish, MD  12/19/2021 12:31 PM    [facility]

## 2021-12-19 NOTE — Patient Instructions (Signed)
Medication Instructions:  Your physician has recommended you make the following change in your medication:  RESTART: Aspirin 81 mg by mouth once daily  *If you need a refill on your cardiac medications before your next appointment, please call your pharmacy*   Lab Work: NONE If you have labs (blood work) drawn today and your tests are completely normal, you will receive your results only by: [place] (if you have MyChart) OR A paper copy in the mail If you have any lab test that is abnormal or we need to change your treatment, we will call you to review the results.   Testing/Procedures: NONE   Follow-Up: At [facility], you and your health needs are our priority.  As part of our continuing mission to provide you with exceptional heart care, we have created designated Provider Care Teams.  These Care Teams include your primary Cardiologist (physician) and Advanced Practice Providers (APPs -  Physician Assistants and Nurse Practitioners) who all work together to provide you with the care you need, when you need it.  We recommend signing up for the patient portal called "MyChart".  Sign up information is provided on this After Visit Summary.  MyChart is used to connect with patients for Virtual Visits (Telemedicine).  Patients are able to view lab/test results, encounter notes, upcoming appointments, etc.  Non-urgent messages can be sent to your provider as well.   To learn more about what you can do with MyChart, go to [email or web address].    Your next appointment:   1 year(s)  The format for your next appointment:   In Person  Provider:   Candee Furbish, MD      Important Information About Sugar

## 2021-12-19 NOTE — Progress Notes (Unsigned)
Bariatric Nutrition Follow-Up Visit Medical Nutrition Therapy   Surgery date: 10/24/2021 Surgery type: Sleeve  NUTRITION ASSESSMENT   Anthropometrics  Start weight at [facility]: 225.9 lbs (date: 08/01/2021)  Height: 63 in Weight today: 209.8 lbs BMI: 37.16 kg/m2     Clinical  Medical hx: HTN, Obesity, Arthritis, CHF, Depression, DM, GERD Medications: Escitalopram, magnesium, low dose aspirin, amoxicillin, entresto, probiotic  Labs: 03/07/2021: glucose 129;  Notable signs/symptoms: none noted Any previous deficiencies? No    Body Composition Scale 11/07/2021 12/19/2021  Current Body Weight 209.8 198.8  Total Body Fat % 43.5 42.1  Visceral Fat 15 14  Fat-Free Mass % 56.4 57.8   Total Body Water % 42.7 43.4  Muscle-Mass lbs 28.5 28.4  BMI 36.7 34.8  Body Fat Displacement           Torso  lbs 56.4 51.8         Left Leg  lbs 11.2 10.3         Right Leg  lbs 11.2 10.3         Left Arm  lbs 5.6 5.1         Right Arm   lbs 5.6 5.1     Lifestyle & Dietary Hx   Pt states she is not happy with her weight loss in the time frame currently stating if she did it on her own she would have the same weight loss. Pt states she feels surgery is a waste.  Pt sates she added fruit and vegetables because what's the point stating sh eis angyr she has not lost as much as other people.  I was never told there periods time she was not going to lsoe weight and veern told she dowul los it so slaw  I dont want tot ea it was too strict  Estimated daily fluid intake: 40-60 oz Estimated daily protein intake: *** g Supplements: forgetting often multi and no calcium  Current average weekly physical activity: ***   24-Hr Dietary Recall First Meal: protein shake Snack:  bolthouse  Second Meal: salad: onions, celery, seeds, pineapple, cheese, low fat dressing Snack:   Third Meal: protein shake Snack: sugar free jello + sugar free cool whip or sugar free ice cream Beverages: water  Post-Op Goals/  Signs/ Symptoms Using straws: *** Drinking while eating: *** Chewing/swallowing difficulties: *** Changes in vision: *** Changes to mood/headaches: *** Hair loss/changes to skin/nails: *** Difficulty focusing/concentrating: *** Sweating: *** Limb weakness: *** Dizziness/lightheadedness: *** Palpitations: ***  Carbonated/caffeinated beverages: *** N/V/D/C/Gas: *** Abdominal pain: *** Dumping syndrome: ***    NUTRITION DIAGNOSIS  Overweight/obesity ([place]-3.3) related to past poor dietary habits and physical inactivity as evidenced by completed bariatric surgery and following dietary guidelines for continued weight loss and healthy nutrition status.     NUTRITION INTERVENTION Nutrition counseling (C-1) and education (E-2) to facilitate bariatric surgery goals, including: Diet advancement to the next phase (phase ***) now including *** The importance of consuming adequate calories as well as certain nutrients daily due to the body's need for essential vitamins, minerals, and fats The importance of daily physical activity and to reach a goal of at least 150 minutes of moderate to vigorous physical activity weekly (or as directed by their physician) due to benefits such as increased musculature and improved lab values The importance of intuitive eating specifically learning hunger-satiety cues and understanding the importance of learning a new body: The importance of mindful eating to avoid grazing behaviors    Goals: Look into different forms  of calcium  Be sure to take the multivitamin    Handouts Provided Include  ***  Learning Style & Readiness for Change Teaching method utilized: Visual & Auditory  Demonstrated degree of understanding via: Teach Back  Readiness Level: *** Barriers to learning/adherence to lifestyle change: ***  RD's Notes for Next Visit Assess adherence to pt chosen goals ***    MONITORING & EVALUATION Dietary intake, weekly physical activity, body  weight, and *** in ***.  Next Steps Patient is to follow-up in *** months for *** month post-op follow-up.

## 2022-01-23 ENCOUNTER — Ambulatory Visit: Payer: BC Managed Care – PPO | Admitting: Skilled Nursing Facility1

## 2022-02-15 ENCOUNTER — Encounter: Payer: BC Managed Care – PPO | Attending: Surgery | Admitting: Skilled Nursing Facility1

## 2022-02-15 ENCOUNTER — Encounter: Payer: Self-pay | Admitting: Skilled Nursing Facility1

## 2022-02-15 DIAGNOSIS — E118 Type 2 diabetes mellitus with unspecified complications: Secondary | ICD-10-CM | POA: Diagnosis not present

## 2022-02-15 NOTE — Progress Notes (Signed)
Bariatric Nutrition Follow-Up Visit Medical Nutrition Therapy   Surgery date: 10/24/2021 Surgery type: Sleeve  NUTRITION ASSESSMENT   Anthropometrics  Start weight at [facility]: 225.9 lbs (date: 08/01/2021)  Height: 63 in Weight today: 190 pounds   Clinical  Medical hx: HTN, Obesity, Arthritis, CHF, Depression, DM, GERD Medications: Escitalopram, magnesium, low dose aspirin, amoxicillin, entresto, probiotic  Labs: 03/07/2021: glucose 129;  Notable signs/symptoms: none noted Any previous deficiencies? No    Body Composition Scale 11/07/2021 12/19/2021 02/15/2022  Current Body Weight 209.8 198.8 190  Total Body Fat % 43.5 42.1 40.8  Visceral Fat '15 14 13  '$ Fat-Free Mass % 56.4 57.8 59.1   Total Body Water % 42.7 43.4 44  Muscle-Mass lbs 28.5 28.4 28.3  BMI 36.7 34.8 33.3  Body Fat Displacement            Torso  lbs 56.4 51.8 48.1         Left Leg  lbs 11.2 10.3 9.6         Right Leg  lbs 11.2 10.3 9.6         Left Arm  lbs 5.6 5.1 4.8         Right Arm   lbs 5.6 5.1 4.8     Lifestyle & Dietary Hx  Pt states gabapentin has been added due to nerve pain she has had for several years.  Pt states she has also struggled to remember to take her Lexapro.  Pt states he goal weight is 150 lbs. Pt states she is not very happy with her weight loss and thought by now it would be at least 35 lbs. Pt states physical activity has been difficult due to back pain.  Pt states she usually has two protein shakes a day. Pt states she has been eating out a lot less.  Pt states she doesn't eat a lot of carbohydrates (fruit, pasta, or rice). Pt states she often doesn't know what to eat.    Estimated daily fluid intake: 40-60 oz Estimated daily protein intake: 60 g Supplements: forgetting often multi and no calcium  Current average weekly physical activity: ADL's   24-Hr Dietary Recall First Meal 9-10 am: premier protein shake or 6 ounces orange juice, english muffin + butter + 2 slices of bacon   Snack 1 pm: protein shake  Snack 3-4 pm: 330 calorie chocolate covered almonds or protein shake   Third Meal by 6 pm: veggie soup + crackers or chicken wings or meat sauce + some spaghetti Snack: sugar free coco and sugar free whipped cream Beverages: water, orange, unsweet tea   Post-Op Goals/ Signs/ Symptoms Using straws: no Drinking while eating: no Chewing/swallowing difficulties: no Changes in vision: no Changes to mood/headaches: no Hair loss/changes to skin/nails: no Difficulty focusing/concentrating: no Sweating: no Limb weakness: no Dizziness/lightheadedness: no Palpitations: no  Carbonated/caffeinated beverages: no N/V/D/C/Gas: no Abdominal pain: no Dumping syndrome: no    NUTRITION DIAGNOSIS  Overweight/obesity ([place]-3.3) related to past poor dietary habits and physical inactivity as evidenced by completed bariatric surgery and following dietary guidelines for continued weight loss and healthy nutrition status.     NUTRITION INTERVENTION Nutrition counseling (C-1) and education (E-2) to facilitate bariatric surgery goals, including: The importance of consuming adequate calories as well as certain nutrients daily due to the body's need for essential vitamins, minerals, and fats The importance of daily physical activity and to reach a goal of at least 150 minutes of moderate to vigorous physical activity weekly (or as directed  by their physician) due to benefits such as increased musculature and improved lab values The importance of intuitive eating specifically learning hunger-satiety cues and understanding the importance of learning a new body: The importance of mindful eating to avoid grazing behaviors  Encouraged patient to honor their body's internal hunger and fullness cues.  Throughout the day, check in mentally and rate hunger. Stop eating when satisfied not full regardless of how much food is left on the plate.  Get more if still hungry 20-30 minutes later.  The key  is to honor satisfaction so throughout the meal, rate fullness factor and stop when comfortably satisfied not physically full. The key is to honor hunger and fullness without any feelings of guilt or shame.  Pay attention to what the internal cues are, rather than any external factors. This will enhance the confidence you have in listening to your own body and following those internal cues enabling you to increase how often you eat when you are hungry not out of appetite and stop when you are satisfied not full.  Encouraged pt to continue to eat balanced meals inclusive of non starchy vegetables 2 times a day 7 days a week Encouraged pt to choose lean protein sources: limiting beef, pork, sausage, hotdogs, and lunch meat Encourage pt to choose healthy fats such as plant based limiting animal fats Encouraged pt to continue to drink a minium 64 fluid ounces with half being plain water to satisfy proper hydration  Why you need complex carbohydrates: Whole grains and other complex carbohydrates are required to have a healthy diet. Whole grains provide fiber which can help with blood glucose levels and help keep you satiated. Fruits and starchy vegetables provide essential vitamins and minerals required for immune function, eyesight support, brain support, bone density, wound healing and many other functions within the body. According to the current evidenced based 2020-2025 Dietary Guidelines for Americans, complex carbohydrates are part of a healthy eating pattern which is associated with a decreased risk for type 2 diabetes, cancers, and cardiovascular disease.  Importance of vegetables To have an overall healthy diet, adult men and women are recommended to consume anywhere from 2-3 cups of vegetables daily. Vegetables provide a wide range of vitamins and minerals such as vitamin A, vitamin C, potassium, and folic acid. According to the [employer], including fruit and vegetables daily may reduce  the risk of cardiovascular disease, certain cancers, and other non-communicable diseases. Why physical activity is needed:  Physical activity has various benefits for the body such as reducing stress, helping control blood glucose levels, strengthening the heart, reducing incidence of cognitive impairment, and has a neuroprotective effect on memory function. Research reports that physical activity (whether it be aerobic, resistance training, or high intensity interval training) help control the level of reactive oxygen species which can lead to oxidative stress on the brain therefor possibly impairing memory, learning abilities, and other cognitive functions. The CDC recognizes that physical activity can help prevent premature death, may prevent the development of type 2 diabetes, different cancers, and heart disease. Not only are there are health benefits to physical activity, the CDC also reports that physical activity would decrease health care costs, increase property values, and people who are physically active generally have less sick days. Resource https://www-sciencedirect-com.libproxy.http://www.castillo-fisher.org/, CDC Offered many specific meal ideas  Goals: Increasing activity to at least 2-3 days a week (dance). Eat three meals a day that include a protein and complex carbohydrate at breakfast and a protein, complex carbohydrate,  and vegetable for lunch and dinner.   Handouts Provided Include  Bariatric 6 month plan   Learning Style & Readiness for Change Teaching method utilized: Visual & Auditory  Demonstrated degree of understanding via: Teach Back  Readiness Level: contemplative  Barriers to learning/adherence to lifestyle change: anxiety  RD's Notes for Next Visit Assess adherence to pt chosen goals.   MONITORING & EVALUATION Dietary intake, weekly physical activity, body weight  Next Steps Patient is to follow-up in 1 month.

## 2022-03-27 ENCOUNTER — Ambulatory Visit: Payer: BC Managed Care – PPO | Admitting: Skilled Nursing Facility1

## 2022-05-22 ENCOUNTER — Encounter: Payer: Self-pay | Admitting: Cardiology

## 2022-05-22 ENCOUNTER — Other Ambulatory Visit: Payer: Self-pay | Admitting: Cardiology

## 2022-05-22 NOTE — Telephone Encounter (Signed)
Spoke with patient by phone to better address her concerns. She is concerned she will not be able to afford her Sherryll Burger since she is currently without insurance. Provided education on the [employer] Patient assistance program and encouraged her to fill it out and bring to [place] to assist in the application process. Patient states she will complete and bring forms to office this week.

## 2022-05-23 ENCOUNTER — Telehealth: Payer: Self-pay

## 2022-05-23 MED ORDER — ENTRESTO 24-26 MG PO TABS: 1.0000 | ORAL_TABLET | Freq: Two times a day (BID) | ORAL | 0 refills | Status: DC

## 2022-05-23 MED ORDER — ENTRESTO 24-26 MG PO TABS: 1.0000 | ORAL_TABLET | Freq: Two times a day (BID) | ORAL | 7 refills | Status: DC

## 2022-05-23 NOTE — Telephone Encounter (Signed)
Patient dropped off application for patient assistance with Entresto. One month supply of sample Entresto 24-26 mg given along with a coupon for 30 day supply.

## 2022-05-24 ENCOUNTER — Telehealth: Payer: Self-pay

## 2022-05-24 NOTE — Telephone Encounter (Signed)
**Note De-Identified Mayo Owczarzak Obfuscation** The pt left her completed NPAF application fir Entresto at the office.  I have completed the providers page of her application and have e-mailed it to Dr Anne Fu nurse so she can obtain his signature, date it, to add the pts proof of income that was given to her by the refill dept (as it is unreadable when I print it off), and to then fax all to NPAF at the fax number written on the cover letter included.

## 2022-05-24 NOTE — Telephone Encounter (Signed)
All paperwork received from both [place] and email, printed and was signed by Dr Anne Fu.   Application and information sent electronically to fax # on cover sheet.

## 2022-05-28 NOTE — Telephone Encounter (Addendum)
**Note De-Identified Dezhane Staten Obfuscation** Letter received from [employer] stating that they denied the pt for Entresto assistance due to "missing proof of income". We did include the proof of income that the pt left with her application but it was very light and hard to read.  I called the pt and she states that she will bring a darker copy of her POI to the office to drop off tomorrow and she is aware that I will fax it to NPAF. She thanked me for my assistance.  NPAF Pt ID: [id number]

## 2022-06-06 ENCOUNTER — Telehealth: Payer: Self-pay | Admitting: Cardiology

## 2022-06-06 MED ORDER — ENTRESTO 24-26 MG PO TABS: 1.0000 | ORAL_TABLET | Freq: Two times a day (BID) | ORAL | 0 refills | Status: DC

## 2022-06-06 NOTE — Telephone Encounter (Signed)
Not sure that we have enough samples for a month for pt, cc'ing Cathy to check.

## 2022-06-06 NOTE — Telephone Encounter (Signed)
Patient states she will not have insurance until June. Her husband lost his job but he just started a new one so she have a lapse in her insurance. She tried to use the copay card but it did not go through. She was denied for patient assistance. She is asking for samples and only have 1 pill left.

## 2022-06-06 NOTE — Telephone Encounter (Signed)
Called pt to inform her that I was leaving 1 bottle of Entresto 24-26 mg tablets samples at [facility] [address] front desk for pt to pick up. Pt verbalized understanding. FYI   Lot: [id number]   Exp: 05/2024

## 2022-06-06 NOTE — Telephone Encounter (Signed)
Patient calling the office for samples of medication:   1.  What medication and dosage are you requesting samples for? sacubitril-valsartan (ENTRESTO) 24-26 MG   2.  Are you currently out of this medication? No, has one days worth left

## 2022-06-11 NOTE — Telephone Encounter (Signed)
**Note De-Identified Tabithia Stroder Obfuscation** The pts proof of income was left at the office. I have faxed to NPAF Beya Tipps [employer]. Fax: Tx 'ok' Report CONE_EMAIL-to-Fax  Jozette Castrellon, Elease Hashimoto This message was sent Redina Zeller [facility], a product from [employer]. http://www.biscom.com/                    -------Fax Transmission Report-------  To:               Recipient at [phone] Subject:          Fw: Entresto Assistance Result:           The transmission was successful. Explanation:      All Pages Ok Pages Sent:       4 Connect Time:     2 minutes, 38 seconds Transmit Time:    06/11/2022 08:22 Transfer Rate:    14400 Status Code:      0000 Retry Count:      0 Job Id:           [id number] Unique Id:        [id number] Fax Line:         28 Fax Server:       [id number]

## 2022-06-13 NOTE — Telephone Encounter (Signed)
**Note De-Identified Mamadou Breon Obfuscation** Letter received from [employer]PA[employer] stating that the pt is approved for Entresto assistance until 06/11/2023. Pt ID: [id number]  The letter states that they have notified the pt of this approval as well.

## 2022-07-08 IMAGING — DX DG CHEST 1V PORT
1 series · 1 of 1 positions shown · non-contrast
Comparison: Chest x-ray 04/09/2013.

CLINICAL DATA: COVID, shortness of breath

EXAM:
PORTABLE CHEST 1 VIEW

[chest]
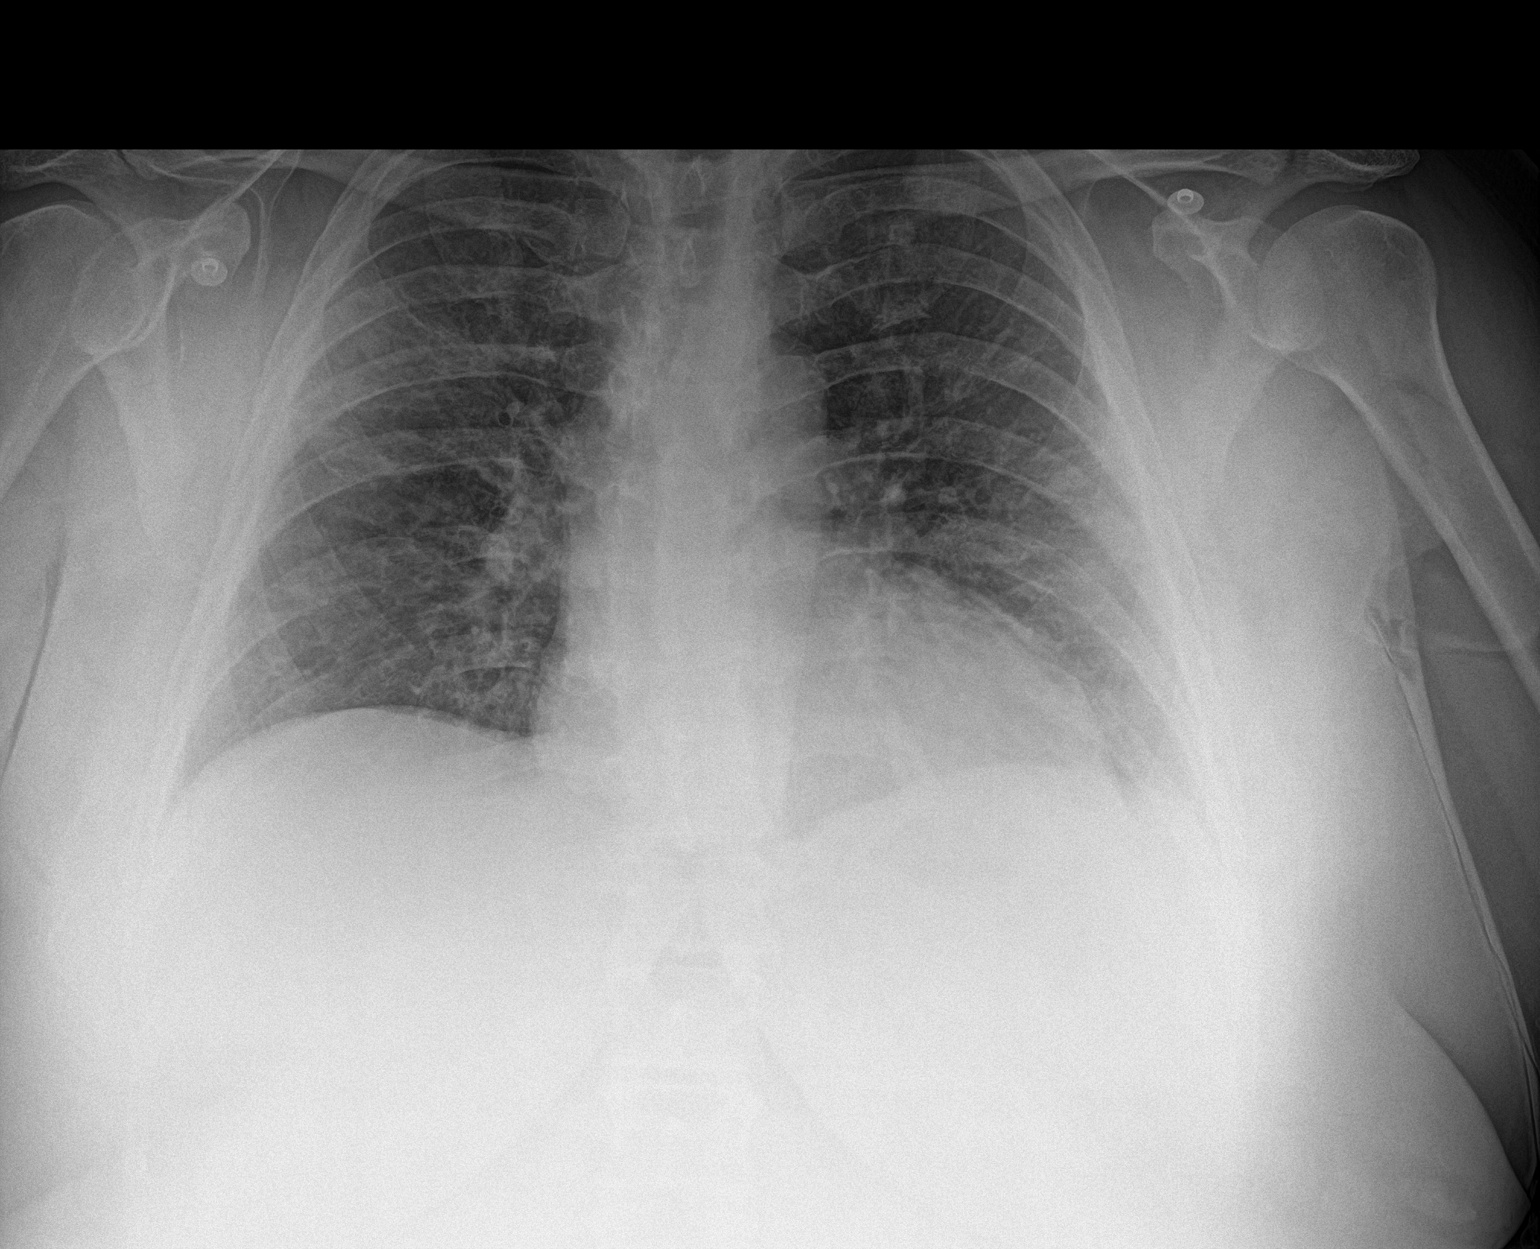

[1 of 1 positions shown; findings below may reference images not displayed]

FINDINGS: The heart size and mediastinal contours are within normal limits.

Hazy patchy airspace opacities that are more prominent peripherally
within the left mid lung zone and right mid upper lung. No pulmonary
edema. No pleural effusion. No pneumothorax.

No acute osseous abnormality.
IMPRESSION: Findings consistent with ZKDS6-T9 infection.

## 2022-07-31 ENCOUNTER — Ambulatory Visit: Payer: BC Managed Care – PPO | Admitting: Skilled Nursing Facility1

## 2022-08-02 DIAGNOSIS — L578 Other skin changes due to chronic exposure to nonionizing radiation: Secondary | ICD-10-CM | POA: Diagnosis not present

## 2022-08-02 DIAGNOSIS — L820 Inflamed seborrheic keratosis: Secondary | ICD-10-CM | POA: Diagnosis not present

## 2022-08-02 DIAGNOSIS — L821 Other seborrheic keratosis: Secondary | ICD-10-CM | POA: Diagnosis not present

## 2022-08-30 ENCOUNTER — Encounter (HOSPITAL_COMMUNITY): Payer: Self-pay | Admitting: *Deleted

## 2022-09-15 ENCOUNTER — Emergency Department (HOSPITAL_COMMUNITY): Payer: BC Managed Care – PPO

## 2022-09-15 ENCOUNTER — Other Ambulatory Visit: Payer: Self-pay

## 2022-09-15 ENCOUNTER — Emergency Department (HOSPITAL_COMMUNITY)
Admission: EM | Admit: 2022-09-15 | Discharge: 2022-09-16 | Disposition: A | Discharge status: Home / Self Care | Payer: BC Managed Care – PPO | Source: Home / Self Care | Attending: Emergency Medicine | Admitting: Emergency Medicine

## 2022-09-15 ENCOUNTER — Encounter (HOSPITAL_COMMUNITY): Payer: Self-pay

## 2022-09-15 DIAGNOSIS — S20112A Abrasion of breast, left breast, initial encounter: Secondary | ICD-10-CM | POA: Diagnosis not present

## 2022-09-15 DIAGNOSIS — W5501XA Bitten by cat, initial encounter: Secondary | ICD-10-CM | POA: Diagnosis not present

## 2022-09-15 DIAGNOSIS — I2510 Atherosclerotic heart disease of native coronary artery without angina pectoris: Secondary | ICD-10-CM | POA: Diagnosis not present

## 2022-09-15 DIAGNOSIS — E119 Type 2 diabetes mellitus without complications: Secondary | ICD-10-CM | POA: Diagnosis not present

## 2022-09-15 DIAGNOSIS — S61451A Open bite of right hand, initial encounter: Secondary | ICD-10-CM | POA: Diagnosis not present

## 2022-09-15 DIAGNOSIS — S61254A Open bite of right ring finger without damage to nail, initial encounter: Secondary | ICD-10-CM | POA: Insufficient documentation

## 2022-09-15 DIAGNOSIS — Z23 Encounter for immunization: Secondary | ICD-10-CM | POA: Diagnosis not present

## 2022-09-15 DIAGNOSIS — I10 Essential (primary) hypertension: Secondary | ICD-10-CM | POA: Insufficient documentation

## 2022-09-15 DIAGNOSIS — S6991XA Unspecified injury of right wrist, hand and finger(s), initial encounter: Secondary | ICD-10-CM | POA: Diagnosis not present

## 2022-09-15 MED ORDER — AMOXICILLIN-POT CLAVULANATE 875-125 MG PO TABS
1.0000 | ORAL_TABLET | Freq: Once | ORAL | Status: AC
  Administered 2022-09-16: 1 via ORAL
  Filled 2022-09-15: qty 1

## 2022-09-15 MED ORDER — TETANUS-DIPHTH-ACELL PERTUSSIS 5-2.5-18.5 LF-MCG/0.5 IM SUSY
0.5000 mL | PREFILLED_SYRINGE | Freq: Once | INTRAMUSCULAR | Status: AC
  Administered 2022-09-16: 0.5 mL via INTRAMUSCULAR
  Filled 2022-09-15: qty 0.5

## 2022-09-15 MED ORDER — BACITRACIN ZINC 500 UNIT/GM EX OINT
TOPICAL_OINTMENT | Freq: Two times a day (BID) | CUTANEOUS | Status: DC
  Administered 2022-09-16: 1 via TOPICAL
  Filled 2022-09-15: qty 0.9

## 2022-09-15 MED ORDER — AMOXICILLIN-POT CLAVULANATE 875-125 MG PO TABS: 1.0000 | ORAL_TABLET | Freq: Two times a day (BID) | ORAL | 0 refills | Status: AC

## 2022-09-15 NOTE — ED Provider Notes (Incomplete)
[place] EMERGENCY DEPARTMENT AT [facility] Provider Note   CSN: [id number] Arrival date & time: 09/15/22  2051     History {Add pertinent medical, surgical, social history, OB history to HPI:1} Chief Complaint  Patient presents with  . Animal Bite    Traci Roy is a 62 y.o. female who presents after cat bite this evening.  States that it was her own cat which she recently got from her friend.  Patient's cat is up-to-date on rabies vaccination, patient is not up-to-date on tetanus.  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  She is not across the street and ripping up.  This is a category 2 movement of the into clogged bite her.  Bites to the dorsum of the right hand, right ring finger and left breast.  I reviewed her medical records.  History of CAD, hypertension and type 2 diabetes.  She is not anticoagulated.  No immunocompromising conditions.9  HPI     Home Medications Prior to Admission medications   Medication Sig Start Date End Date Taking? Authorizing Provider  amoxicillin-clavulanate (AUGMENTIN) 875-125 MG tablet Take 1 tablet by mouth every 12 (twelve) hours for 5 days. 09/15/22 09/20/22 Yes , Eugene Gavia, PA-C  acetaminophen (TYLENOL) 500 MG tablet Take 500 mg by mouth every 6 (six) hours as needed for moderate pain or mild pain.    [provider]  aspirin EC 81 MG tablet Take 1 tablet (81 mg total) by mouth daily. Swallow whole. 12/19/21   Jake Bathe, MD  escitalopram (LEXAPRO) 20 MG tablet Take 20 mg by mouth daily. 09/04/19   [provider]  gabapentin (NEURONTIN) 100 MG capsule Take 2 capsules (200 mg total) by mouth every 12 (twelve) hours. 10/25/21   Berna Bue, MD  nitroGLYCERIN (NITROSTAT) 0.4 MG SL tablet PLACE 1 TABLET (0.4 MG TOTAL) UNDER THE TONGUE EVERY FIVE MINUTES AS NEEDED FOR CHEST PAIN. 12/18/19   Bhagat, Sharrell Ku, PA  pantoprazole (PROTONIX) 40 MG tablet Take 1 tablet (40 mg  total) by mouth daily. 10/25/21   Berna Bue, MD  sacubitril-valsartan (ENTRESTO) 24-26 MG Take 1 tablet by mouth 2 (two) times daily. 05/23/22   Jake Bathe, MD  sacubitril-valsartan (ENTRESTO) 24-26 MG Take 1 tablet by mouth 2 (two) times daily. 06/06/22   Jake Bathe, MD      Allergies    Lisinopril, Adhesive [tape], and Silicone    Review of Systems   Review of Systems  Skin:        Cat bites    Physical Exam Updated Vital Signs BP 125/84   Pulse 71   Temp 98.9 F (37.2 C)   Resp 17   Ht 5\' 3"  (1.6 m)   Wt 81.2 kg   LMP 02/05/2014   SpO2 100%   BMI 31.71 kg/m  Physical Exam Vitals and nursing note reviewed.  Constitutional:      Appearance: She is not ill-appearing or toxic-appearing.  HENT:     Head: Normocephalic and atraumatic.  Eyes:     General: No scleral icterus.       Right eye: No discharge.        Left eye: No discharge.     Conjunctiva/sclera: Conjunctivae normal.  Pulmonary:     Effort: Pulmonary effort is normal.  Chest:     Chest wall: No mass, lacerations, deformity, swelling, tenderness, crepitus or edema.    Musculoskeletal:  Hands:  Skin:    General: Skin is warm and dry.  Neurological:     General: No focal deficit present.     Mental Status: She is alert.  Psychiatric:        Mood and Affect: Mood normal.     ED Results / Procedures / Treatments   Labs (all labs ordered are listed, but only abnormal results are displayed) Labs Reviewed - No data to display  EKG None  Radiology No results found.  Procedures Procedures  {Document cardiac monitor, telemetry assessment procedure when appropriate:1}  Medications Ordered in ED Medications  amoxicillin-clavulanate (AUGMENTIN) 875-125 MG per tablet 1 tablet (has no administration in time range)  bacitracin ointment (has no administration in time range)  Tdap (BOOSTRIX) injection 0.5 mL (has no administration in time range)    ED Course/ Medical Decision  Making/ A&P   {   Click here for ABCD2, HEART and other calculatorsREFRESH Note before signing :1}                              Medical Decision Making 62 y/o female who presents with concern cat bites.   VS normal on intake, neurovascularly intact in the right hand. Puncture wounds to the right hand with mild soft tissue swelling without erythema. No purulence.    Boostrix updated. First dose of augmentin administered.   {Document critical care time when appropriate:1} {Document review of labs and clinical decision tools ie heart score, Chads2Vasc2 etc:1}  {Document your independent review of radiology images, and any outside records:1} {Document your discussion with family members, caretakers, and with consultants:1} {Document social determinants of health affecting pt's care:1} {Document your decision making why or why not admission, treatments were needed:1} Final Clinical Impression(s) / ED Diagnoses Final diagnoses:  Cat bite, initial encounter    Rx / DC Orders ED Discharge Orders          Ordered    amoxicillin-clavulanate (AUGMENTIN) 875-125 MG tablet  Every 12 hours        09/15/22 2339

## 2022-09-15 NOTE — ED Provider Notes (Addendum)
[place] EMERGENCY DEPARTMENT AT [facility] Provider Note   CSN: [id number] Arrival date & time: 09/15/22  2051     History  Chief Complaint  Patient presents with   Animal Bite    Traci Roy is a 62 y.o. female who presents after cat bite this evening.  States that it was her own cat which she recently got from her friend.  Patient's cat is up-to-date on rabies vaccination, patient is not up-to-date on tetanus.  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  She saw the cat across the street and picked it up.  This caused the cat to become aggravated and it began to her.  Bites to the dorsum of the right hand, right ring finger and scratches to the left breast.  I reviewed her medical records.  History of CAD, hypertension and type 2 diabetes.  She is not anticoagulated.  No immunocompromising conditions.  HPI     Home Medications Prior to Admission medications   Medication Sig Start Date End Date Taking? Authorizing Provider  amoxicillin-clavulanate (AUGMENTIN) 875-125 MG tablet Take 1 tablet by mouth every 12 (twelve) hours for 5 days. 09/15/22 09/20/22 Yes , Eugene Gavia, PA-C  acetaminophen (TYLENOL) 500 MG tablet Take 500 mg by mouth every 6 (six) hours as needed for moderate pain or mild pain.    [provider]  aspirin EC 81 MG tablet Take 1 tablet (81 mg total) by mouth daily. Swallow whole. 12/19/21   Jake Bathe, MD  escitalopram (LEXAPRO) 20 MG tablet Take 20 mg by mouth daily. 09/04/19   [provider]  gabapentin (NEURONTIN) 100 MG capsule Take 2 capsules (200 mg total) by mouth every 12 (twelve) hours. 10/25/21   Berna Bue, MD  nitroGLYCERIN (NITROSTAT) 0.4 MG SL tablet PLACE 1 TABLET (0.4 MG TOTAL) UNDER THE TONGUE EVERY FIVE MINUTES AS NEEDED FOR CHEST PAIN. 12/18/19   Bhagat, Sharrell Ku, PA  pantoprazole (PROTONIX) 40 MG tablet Take 1 tablet (40 mg total) by mouth daily. 10/25/21   Berna Bue, MD  sacubitril-valsartan (ENTRESTO) 24-26 MG Take 1 tablet by mouth 2 (two) times daily. 05/23/22   Jake Bathe, MD  sacubitril-valsartan (ENTRESTO) 24-26 MG Take 1 tablet by mouth 2 (two) times daily. 06/06/22   Jake Bathe, MD      Allergies    Lisinopril, Adhesive [tape], and Silicone    Review of Systems   Review of Systems  Skin:        Cat bites    Physical Exam Updated Vital Signs BP 125/84   Pulse 71   Temp 98.9 F (37.2 C)   Resp 17   Ht 5\' 3"  (1.6 m)   Wt 81.2 kg   LMP 02/05/2014   SpO2 100%   BMI 31.71 kg/m  Physical Exam Vitals and nursing note reviewed.  Constitutional:      Appearance: She is not ill-appearing or toxic-appearing.  HENT:     Head: Normocephalic and atraumatic.  Eyes:     General: No scleral icterus.       Right eye: No discharge.        Left eye: No discharge.     Conjunctiva/sclera: Conjunctivae normal.  Pulmonary:     Effort: Pulmonary effort is normal.  Chest:     Chest wall: No mass, lacerations, deformity, swelling, tenderness, crepitus or edema.    Musculoskeletal:       Hands:  Skin:    General: Skin is warm and dry.  Neurological:     General: No focal deficit present.     Mental Status: She is alert.  Psychiatric:        Mood and Affect: Mood normal.     ED Results / Procedures / Treatments   Labs (all labs ordered are listed, but only abnormal results are displayed) Labs Reviewed - No data to display  EKG None  Radiology No results found.  Procedures Procedures    Medications Ordered in ED Medications  bacitracin ointment (1 Application Topical Given 09/16/22 0016)  acetaminophen (TYLENOL) tablet 650 mg (has no administration in time range)  amoxicillin-clavulanate (AUGMENTIN) 875-125 MG per tablet 1 tablet (1 tablet Oral Given 09/16/22 0015)  Tdap (BOOSTRIX) injection 0.5 mL (0.5 mLs Intramuscular Given 09/16/22 0016)    ED Course/ Medical Decision Making/ A&P                                  Medical Decision Making 62 y/o female who presents with concern cat bites.   VS normal on intake, neurovascularly intact in the right hand. Puncture wounds to the right hand with mild soft tissue swelling without erythema. No purulence.   Amount and/or Complexity of Data Reviewed Radiology: ordered.    Details: No retained FB on the xray. Visualized by this provider.   Risk OTC drugs. Prescription drug management.   Boostrix updated. First dose of augmentin administered. Cat UTD on rabies vaccine. NO further workup warranted in the ED at this time.   Traci Roy voiced understanding of her medical evaluation and treatment plan. Each of their questions answered to their expressed satisfaction.  Return precautions were given.  Patient is well-appearing, stable, and was discharged in good condition.  This chart was dictated using voice recognition software, Dragon. Despite the best efforts of this provider to proofread and correct errors, errors may still occur which can change documentation meaning.  Final Clinical Impression(s) / ED Diagnoses Final diagnoses:  Cat bite, initial encounter    Rx / DC Orders ED Discharge Orders          Ordered    amoxicillin-clavulanate (AUGMENTIN) 875-125 MG tablet  Every 12 hours        09/15/22 2339              , Eugene Gavia, PA-C 09/16/22 0044    Palumbo, April, MD 09/16/22 1610

## 2022-09-15 NOTE — ED Triage Notes (Signed)
Was bit by her cat multiple times within the last 2 hours on her left breast, hands and elbow. Cat was vaccinated for rabies.

## 2022-09-16 ENCOUNTER — Emergency Department (HOSPITAL_COMMUNITY): Payer: BC Managed Care – PPO

## 2022-09-16 DIAGNOSIS — S61451A Open bite of right hand, initial encounter: Secondary | ICD-10-CM | POA: Diagnosis not present

## 2022-09-16 MED ORDER — ACETAMINOPHEN 325 MG PO TABS
650.0000 mg | ORAL_TABLET | Freq: Once | ORAL | Status: AC
  Administered 2022-09-16: 650 mg via ORAL
  Filled 2022-09-16: qty 2

## 2022-09-16 NOTE — ED Notes (Signed)
Pt educated on DC instructions and verbalizes understanding, pt appears to be in NAD at this time

## 2022-09-16 NOTE — Discharge Instructions (Addendum)
You were seen in the ER today for your cat bite. Your xray did not have any retained teeth. Your cat is  uptodate on her rabies vaccine and your tetanus has been updated today. You have been started on an antibiotic which you should take for the entire course. Please keep the wounds clean and dry. You may apply antibiotic ointment daily as well. Follow up with your primary care doctor and return to the ER with any new severe symptoms.

## 2022-10-05 ENCOUNTER — Encounter: Payer: Self-pay | Admitting: Cardiology

## 2022-10-10 ENCOUNTER — Telehealth: Payer: Self-pay | Admitting: Cardiology

## 2022-10-10 NOTE — Telephone Encounter (Signed)
Pt c/o medication issue:  1. Name of Medication: Ibuprofen  2. How are you currently taking this medication (dosage and times per day)?   3. Are you having a reaction (difficulty breathing--STAT)?   4. What is your medication issue?   Patient called to follow-up on My Chart message she left on 8/30 and stated she would prefer to take Ibuprofen for her back and wants to know why she can't take this medication.  Patient stated she wants to know which anti-inflammatory medication she can take and wants a call back to discuss next steps.

## 2022-10-10 NOTE — Telephone Encounter (Signed)
Long term NSAID use can increase the risk of MI and stroke and can also worsen HF (edema) or increase blood pressure. Pt does have hx of HF (NYHA class I however) and CAD.  Need to weigh risks and benefits- not on anticoagulation but is on ASA Celebrex or Meloxicam thought to be a little safer than IBU  Since questions directed to Dr. Anne Fu and per pt report he told her not to, will leave final response to patient up to Dr. Anne Fu

## 2022-10-15 NOTE — Telephone Encounter (Signed)
Spoke with pt who is aware long term NSAID use can increase risk of MI, stroke, worsening of CHF and increase BP.  Advised per Dr Anne Fu - OK to take short term for relief if necessary.  She reports her PCP wants her to see a neurologist for further evaluation and she will look into obtaining a referral.  Will also try OTC salonpas if necessary.  She will appreciative of the call back and information.

## 2022-10-15 NOTE — Telephone Encounter (Signed)
  Thank you Melissa. There are some increased risks as she noted with use of NSAIDs.  However, I would not be opposed to you taking a course of treatment for a few weeks to see if this helps with your current orthopedic issues.  Donato Schultz, MD

## 2022-10-18 DIAGNOSIS — R937 Abnormal findings on diagnostic imaging of other parts of musculoskeletal system: Secondary | ICD-10-CM | POA: Diagnosis not present

## 2022-10-18 DIAGNOSIS — M5441 Lumbago with sciatica, right side: Secondary | ICD-10-CM | POA: Diagnosis not present

## 2022-10-18 DIAGNOSIS — G8929 Other chronic pain: Secondary | ICD-10-CM | POA: Diagnosis not present

## 2022-10-18 DIAGNOSIS — M5442 Lumbago with sciatica, left side: Secondary | ICD-10-CM | POA: Diagnosis not present

## 2022-11-13 DIAGNOSIS — M4316 Spondylolisthesis, lumbar region: Secondary | ICD-10-CM | POA: Diagnosis not present

## 2022-11-13 DIAGNOSIS — G2581 Restless legs syndrome: Secondary | ICD-10-CM | POA: Diagnosis not present

## 2022-11-13 DIAGNOSIS — M5417 Radiculopathy, lumbosacral region: Secondary | ICD-10-CM | POA: Diagnosis not present

## 2022-11-13 NOTE — Progress Notes (Deleted)
Patient ID: Traci Roy, female   DOB: [birth year], 62 y.o.   MRN: [id number]  Reason for Consult: No chief complaint on file.   Referred by Mila Palmer, MD  Subjective:     HPI  Traci Roy is a 62 y.o. female presenting for evaluation of right lower extremity varicose veins.  She reports she has had varicose veins for many years but over time of started to get painful.  Her legs ache towards the end of the day, right greater than left and her varicosities throughout.  She has been using compression stockings for ***and does intermittent elevation throughout the day.  She denies any history of DVT.  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   CHF (congestive heart failure) (HCC)    Depression    takes Lexapro daily   Diabetes mellitus without complication (HCC)    Dizziness    occasionally and no meds   Flu    end of Dec 2014   GERD (gastroesophageal reflux disease)    occasionally and no meds required   History of bronchitis    last time many yrs ago   History of duodenal ulcer    History of shingles 3-5 YRS AGO   Hypertension    takes Losartan and HCTZ daily   Joint pain    Restless legs    Family History  Family history unknown: Yes   Past Surgical History:  Procedure Laterality Date   BREAST ENHANCEMENT SURGERY Bilateral    CESAREAN SECTION     X 1   COLONOSCOPY     CORONARY PRESSURE/FFR STUDY N/A 12/18/2019   Procedure: INTRAVASCULAR PRESSURE WIRE/FFR STUDY;  Surgeon: Corky Crafts, MD;  Location: [facility] INVASIVE CV LAB;  Service: Cardiovascular;  Laterality: N/A;   CORONARY STENT INTERVENTION N/A 12/18/2019   Procedure: CORONARY STENT INTERVENTION;  Surgeon: Corky Crafts, MD;  Location: [facility] INVASIVE CV LAB;  Service: Cardiovascular;  Laterality: N/A;   enlarged bladder as a child     HAD SURGERY TO ENLARGE BLADDER   LAPAROSCOPIC GASTRIC SLEEVE RESECTION N/A 10/24/2021    Procedure: LAPAROSCOPIC SLEEVE GASTRECTOMY WITH HIATAL HERNIA REPAIR;  Surgeon: Berna Bue, MD;  Location: [facility] ORS;  Service: General;  Laterality: N/A;   RIGHT/LEFT HEART CATH AND CORONARY ANGIOGRAPHY N/A 12/18/2019   Procedure: RIGHT/LEFT HEART CATH AND CORONARY ANGIOGRAPHY;  Surgeon: Corky Crafts, MD;  Location: [facility] INVASIVE CV LAB;  Service: Cardiovascular;  Laterality: N/A;   TONSILLECTOMY     TOTAL HIP ARTHROPLASTY Right 04/14/2013   Procedure: TOTAL HIP ARTHROPLASTY ANTERIOR APPROACH;  Surgeon: Velna Ochs, MD;  Location: [facility] OR;  Service: Orthopedics;  Laterality: Right;   UPPER GI ENDOSCOPY N/A 10/24/2021   Procedure: UPPER GI ENDOSCOPY;  Surgeon: Berna Bue, MD;  Location: [facility] ORS;  Service: General;  Laterality: N/A;    Short Social History:  Social History   Tobacco Use   Smoking status: Never   Smokeless tobacco: Never  Substance Use Topics   Alcohol use: Yes    Comment: glass of wine every now and then    Allergies  Allergen Reactions   Lisinopril Cough   Adhesive [Tape] Itching and Rash    Itching and breaks out   Silicone Itching and Rash    Itching and breaks out    Current Outpatient Medications  Medication Sig Dispense  Refill   acetaminophen (TYLENOL) 500 MG tablet Take 500 mg by mouth every 6 (six) hours as needed for moderate pain or mild pain.     aspirin EC 81 MG tablet Take 1 tablet (81 mg total) by mouth daily. Swallow whole. 30 tablet 12   escitalopram (LEXAPRO) 20 MG tablet Take 20 mg by mouth daily.     gabapentin (NEURONTIN) 100 MG capsule Take 2 capsules (200 mg total) by mouth every 12 (twelve) hours. 20 capsule 0   nitroGLYCERIN (NITROSTAT) 0.4 MG SL tablet PLACE 1 TABLET (0.4 MG TOTAL) UNDER THE TONGUE EVERY FIVE MINUTES AS NEEDED FOR CHEST PAIN. 25 tablet 0   pantoprazole (PROTONIX) 40 MG tablet Take 1 tablet (40 mg total) by mouth daily. 90 tablet 0   sacubitril-valsartan (ENTRESTO) 24-26 MG Take 1 tablet by mouth 2  (two) times daily. 60 tablet 7   sacubitril-valsartan (ENTRESTO) 24-26 MG Take 1 tablet by mouth 2 (two) times daily. 28 tablet 0   No current facility-administered medications for this visit.    REVIEW OF SYSTEMS      Objective:  Objective   There were no vitals filed for this visit. There is no height or weight on file to calculate BMI.  Physical Exam General: no acute distress Cardiac: hemodynamically stable, nontachycardic Pulm: normal work of breathing GI: non-tender, no pulsatile mass  Neuro: alert, no focal deficit Extremities: *** Vascular: ***   Data: ***     Assessment/Plan:     Traci Roy is a 62 y.o. female ***     Daria Pastures MD [facility]

## 2022-11-14 ENCOUNTER — Other Ambulatory Visit: Payer: Self-pay | Admitting: Neurosurgery

## 2022-11-14 ENCOUNTER — Encounter: Payer: BC Managed Care – PPO | Admitting: Vascular Surgery

## 2022-11-14 DIAGNOSIS — M5417 Radiculopathy, lumbosacral region: Secondary | ICD-10-CM

## 2022-11-16 ENCOUNTER — Institutional Professional Consult (permissible substitution): Payer: BC Managed Care – PPO | Admitting: Plastic Surgery

## 2022-11-21 ENCOUNTER — Institutional Professional Consult (permissible substitution): Payer: BC Managed Care – PPO | Admitting: Plastic Surgery

## 2022-12-12 ENCOUNTER — Other Ambulatory Visit: Payer: BC Managed Care – PPO

## 2022-12-17 ENCOUNTER — Encounter: Payer: Self-pay | Admitting: Physician Assistant

## 2022-12-20 NOTE — Progress Notes (Signed)
Cardiology Office Note:  .   Date:  12/21/2022  ID:  Traci Roy, DOB 18-Feb-1960, MRN [id number] PCP: Mila Palmer, MD  [facility] Providers Cardiologist:  Donato Schultz, MD {  History of Present Illness: .   Traci Roy is a 62 y.o. female w follow-up appointment.History includes LVEF of 40 to 45%. past medical history of coronary artery disease with prior LAD stent in November 2021 here for follow-up appointment.  She has been battling weight loss.  Saw Dr. Doylene Canard, general surgery 12/23.  She went for bariatric surgery evaluation.  Discussed particularly sleeve gastrectomy.  Previously discussed her diagnosis of chronic systolic heart failure previously NYHA class I.  Concerned about her mortality.  Reassurance was given.  Was seen last year and overall was feeling well.  She has always a concern of struggling to lose weight.  Reported that before surgery she was told to stop aspirin.  She denied palpitations, chest pain/pressure, shortness of breath, peripheral edema, lightheadedness, headaches, syncope, orthopnea, PND at that visit.  Today, she presents with a history of coronary disease, hypertension, and diabetes,  for a follow-up visit. She has been taking magnesium supplements and has been struggling with weight. She has been experiencing positional dizziness, which she believes is related to the way she sits up or the position of her head when she sits up. The dizziness usually resolves within a day, but recently it lasted a couple of days. The patient also has a cyst on her liver, which has grown since it was first discovered. The patient is scheduled for an MRI for back pain and was hopeful to get imaging of her liver as well. This was discovered back in 2021 and was noted to have grown but no further imaging was ordered.   Reports no shortness of breath nor dyspnea on exertion. Reports no chest pain, pressure, or tightness. No edema, orthopnea, PND. Reports no  palpitations.    Discussed the use of AI scribe software for clinical note transcription with the patient, who gave verbal consent to proceed.  ROS: Pertinent ROS in HPI  Studies Reviewed: Marland Kitchen   EKG Interpretation Date/Time:  Friday December 21 2022 11:20:50 EST Ventricular Rate:  74 PR Interval:  140 QRS Duration:  80 QT Interval:  376 QTC Calculation: 417 R Axis:   25  Text Interpretation: Normal sinus rhythm Low voltage QRS Nonspecific T wave abnormality When compared with ECG of 10-Jun-2021 09:32, Minimal criteria for Anterior infarct are no longer Present Confirmed by Jari Favre [id number]) on 12/21/2022 11:36:28 AM    ECHO 10/28/2020  1. Left ventricular ejection fraction, by estimation, is 40 to 45%. The  left ventricle has mildly decreased function. The left ventricle  demonstrates global hypokinesis. The left ventricular internal cavity size  was mildly dilated. Left ventricular  diastolic parameters are consistent with Grade I diastolic dysfunction  (impaired relaxation).   2. Right ventricular systolic function is normal. The right ventricular  size is normal.   3. The mitral valve is normal in structure. Mild mitral valve  regurgitation. No evidence of mitral stenosis.   4. The aortic valve has an indeterminant number of cusps. Aortic valve  regurgitation is not visualized. No aortic stenosis is present.   5. The inferior vena cava is normal in size with greater than 50%  respiratory variability, suggesting right atrial pressure of 3 mmHg.   Comparison(s): 12/02/2019 40-45%.    Cardiac Cath 12/18/2019: 2nd Diag lesion is 50% stenosed. Mid LAD  lesion is 70% stenosed. Significant by DFR. A drug-eluting stent was successfully placed using a STENT RESOLUTE ONYX 3.0X26, postdilated to 3.75. Post intervention, there is a 0% residual stenosis. The left ventricular ejection fraction is 50-55% by visual estimate. LV end diastolic pressure is normal. The left ventricular  systolic function is normal. There is no aortic valve stenosis. Ao sat 97%, PA sat 72%, PA pressure 35/14, mean PA pressure 24 mm Hg, mean PCWP 11 mm Hg; CO 6.3 L/min; CI 3.01 Normal right heart pressures. EBU 3.5 would have been a better choice of guide catheter.   Continue dual antiplatelet therapy along with aggressive secondary prevention   EKG:  EKG is personally reviewed and interpreted.  12/19/2021: EKG was not ordered. 06/23/2021: EKG is ordered today.  The ekg ordered today demonstrates normal sinus rhythm 83 with poor R wave progression nonspecific T wave changes.  No change from prior.   Prior EKG shows sinus rhythm with poor R wave progression        Physical Exam:   VS:  BP 122/80   Pulse 79   Ht 5\' 3"  (1.6 m)   Wt 185 lb 3.2 oz (84 kg)   LMP 02/05/2014   SpO2 99%   BMI 32.81 kg/m    Wt Readings from Last 3 Encounters:  12/21/22 185 lb 3.2 oz (84 kg)  09/15/22 179 lb (81.2 kg)  02/15/22 190 lb 4.8 oz (86.3 kg)    GEN: Well nourished, well developed in no acute distress NECK: No JVD; No carotid bruits CARDIAC: RRR, no murmurs, rubs, gallops RESPIRATORY:  Clear to auscultation without rales, wheezing or rhonchi  ABDOMEN: Soft, non-tender, non-distended EXTREMITIES:  No edema; No deformity   ASSESSMENT AND PLAN: .     Hyperlipidemia -continue annual surveillance  Coronary Artery Disease Stable, no symptoms of chest pain, shortness of breath, or palpitations. EKG normal. -Continue Entresto 24/26mg  daily and Aspirin 81mg  daily. -Order lipid panel and liver function tests.  Positional Vertigo Reports occasional episodes of dizziness, possibly related to head position. Symptoms suggestive of benign paroxysmal positional vertigo (BPPV). -Recommend discussing with primary care provider for further evaluation and possible referral to physical therapy.  Liver Cyst History of a liver cyst that has reportedly grown in size. No current symptoms of liver  disease. -Recommend discussing with primary care provider for further evaluation and possible imaging or biopsy.  General Health Maintenance -Continue Magnesium 500mg  daily as tolerated. -Plan to check A1C at upcoming primary care appointment. -Order echocardiogram to reassess ejection fraction, last known to be mildly reduced at 40-45% in 2022.      Dispo: She can follow-up in a year with Dr. Anne Fu  Signed, Sharlene Dory, PA-C

## 2022-12-21 ENCOUNTER — Ambulatory Visit
Admission: RE | Admit: 2022-12-21 | Discharge: 2022-12-21 | Disposition: A | Discharge status: Home / Self Care | Payer: BC Managed Care – PPO | Source: Ambulatory Visit | Attending: Neurosurgery | Admitting: Neurosurgery

## 2022-12-21 ENCOUNTER — Other Ambulatory Visit: Payer: Self-pay | Admitting: *Deleted

## 2022-12-21 ENCOUNTER — Encounter: Payer: Self-pay | Admitting: Physician Assistant

## 2022-12-21 ENCOUNTER — Ambulatory Visit: Payer: BC Managed Care – PPO | Attending: Physician Assistant | Admitting: Physician Assistant

## 2022-12-21 VITALS — BP 122/80 | HR 79 | Ht 63.0 in | Wt 185.2 lb

## 2022-12-21 DIAGNOSIS — E785 Hyperlipidemia, unspecified: Secondary | ICD-10-CM

## 2022-12-21 DIAGNOSIS — I2510 Atherosclerotic heart disease of native coronary artery without angina pectoris: Secondary | ICD-10-CM

## 2022-12-21 DIAGNOSIS — M5417 Radiculopathy, lumbosacral region: Secondary | ICD-10-CM

## 2022-12-21 DIAGNOSIS — M4316 Spondylolisthesis, lumbar region: Secondary | ICD-10-CM | POA: Diagnosis not present

## 2022-12-21 DIAGNOSIS — M47816 Spondylosis without myelopathy or radiculopathy, lumbar region: Secondary | ICD-10-CM | POA: Diagnosis not present

## 2022-12-21 DIAGNOSIS — K219 Gastro-esophageal reflux disease without esophagitis: Secondary | ICD-10-CM | POA: Insufficient documentation

## 2022-12-21 DIAGNOSIS — I10 Essential (primary) hypertension: Secondary | ICD-10-CM

## 2022-12-21 DIAGNOSIS — I5020 Unspecified systolic (congestive) heart failure: Secondary | ICD-10-CM | POA: Diagnosis not present

## 2022-12-21 NOTE — Patient Instructions (Addendum)
Medication Instructions:  Your physician recommends that you continue on your current medications as directed. Please refer to the Current Medication list given to you today.  *If you need a refill on your cardiac medications before your next appointment, please call your pharmacy*   Lab Work: WHEN YOU COME FOR THE ECHO, COME FASTING FOR:  LIPID & LFT  If you have labs (blood work) drawn today and your tests are completely normal, you will receive your results only by: MyChart Message (if you have MyChart) OR A paper copy in the mail If you have any lab test that is abnormal or we need to change your treatment, we will call you to review the results.   Testing/Procedures: Your physician has requested that you have an echocardiogram. Echocardiography is a painless test that uses sound waves to create images of your heart. It provides your doctor with information about the size and shape of your heart and how well your heart's chambers and valves are working. This procedure takes approximately one hour. There are no restrictions for this procedure. Please do NOT wear cologne, perfume, aftershave, or lotions (deodorant is allowed). Please arrive 15 minutes prior to your appointment time.  Please note: We ask at that you not bring children with you during ultrasound (echo/ vascular) testing. Due to room size and safety concerns, children are not allowed in the ultrasound rooms during exams. Our front office staff cannot provide observation of children in our lobby area while testing is being conducted. An adult accompanying a patient to their appointment will only be allowed in the ultrasound room at the discretion of the ultrasound technician under special circumstances. We apologize for any inconvenience.    Follow-Up: At [facility], you and your health needs are our priority.  As part of our continuing mission to provide you with exceptional heart care, we have created designated  Provider Care Teams.  These Care Teams include your primary Cardiologist (physician) and Advanced Practice Providers (APPs -  Physician Assistants and Nurse Practitioners) who all work together to provide you with the care you need, when you need it.  We recommend signing up for the patient portal called "MyChart".  Sign up information is provided on this After Visit Summary.  MyChart is used to connect with patients for Virtual Visits (Telemedicine).  Patients are able to view lab/test results, encounter notes, upcoming appointments, etc.  Non-urgent messages can be sent to your provider as well.   To learn more about what you can do with MyChart, go to [email or web address].    Your next appointment:   1 year(s)  Provider:   Donato Schultz, MD     Other Instructions

## 2022-12-25 DIAGNOSIS — I2510 Atherosclerotic heart disease of native coronary artery without angina pectoris: Secondary | ICD-10-CM | POA: Diagnosis not present

## 2022-12-25 DIAGNOSIS — I10 Essential (primary) hypertension: Secondary | ICD-10-CM | POA: Diagnosis not present

## 2022-12-25 DIAGNOSIS — E785 Hyperlipidemia, unspecified: Secondary | ICD-10-CM | POA: Diagnosis not present

## 2022-12-25 DIAGNOSIS — K7689 Other specified diseases of liver: Secondary | ICD-10-CM | POA: Diagnosis not present

## 2022-12-25 DIAGNOSIS — E119 Type 2 diabetes mellitus without complications: Secondary | ICD-10-CM | POA: Diagnosis not present

## 2022-12-25 LAB — LAB REPORT - SCANNED
A1c: 5.6
Albumin, Urine POC: 0.7
Albumin/Creatinine Ratio, Urine, POC: 4.7
Creatinine, POC: 150 mg/dL
EGFR: 72

## 2022-12-26 ENCOUNTER — Other Ambulatory Visit: Payer: Self-pay | Admitting: Family Medicine

## 2022-12-26 DIAGNOSIS — K7689 Other specified diseases of liver: Secondary | ICD-10-CM

## 2022-12-27 ENCOUNTER — Encounter (HOSPITAL_COMMUNITY): Payer: BC Managed Care – PPO

## 2022-12-27 ENCOUNTER — Ambulatory Visit
Admission: RE | Admit: 2022-12-27 | Discharge: 2022-12-27 | Disposition: A | Discharge status: Home / Self Care | Payer: BC Managed Care – PPO | Source: Ambulatory Visit | Attending: Family Medicine | Admitting: Family Medicine

## 2022-12-27 DIAGNOSIS — K7689 Other specified diseases of liver: Secondary | ICD-10-CM

## 2023-01-17 ENCOUNTER — Other Ambulatory Visit: Payer: Self-pay | Admitting: *Deleted

## 2023-01-17 DIAGNOSIS — E785 Hyperlipidemia, unspecified: Secondary | ICD-10-CM

## 2023-02-01 ENCOUNTER — Ambulatory Visit (HOSPITAL_COMMUNITY): Payer: BC Managed Care – PPO | Attending: Physician Assistant

## 2023-02-01 DIAGNOSIS — I10 Essential (primary) hypertension: Secondary | ICD-10-CM

## 2023-02-01 DIAGNOSIS — E785 Hyperlipidemia, unspecified: Secondary | ICD-10-CM

## 2023-02-01 DIAGNOSIS — I5020 Unspecified systolic (congestive) heart failure: Secondary | ICD-10-CM

## 2023-02-01 DIAGNOSIS — I2510 Atherosclerotic heart disease of native coronary artery without angina pectoris: Secondary | ICD-10-CM | POA: Diagnosis not present

## 2023-02-01 LAB — ECHOCARDIOGRAM COMPLETE
Area-P 1/2: 4.21 cm2
S' Lateral: 3.57 cm

## 2023-02-02 LAB — LIPID PANEL
Chol/HDL Ratio: 3.9 {ratio} (ref 0.0–4.4)
Cholesterol, Total: 259 mg/dL — ABNORMAL HIGH (ref 100–199)
HDL: 66 mg/dL (ref >39)
LDL Chol Calc (NIH): 171 mg/dL — ABNORMAL HIGH (ref 0–99)
Triglycerides: 122 mg/dL (ref 0–149)
VLDL Cholesterol Cal: 22 mg/dL (ref 5–40)

## 2023-02-02 LAB — HEPATIC FUNCTION PANEL
ALT: 12 [IU]/L (ref 0–32)
AST: 12 [IU]/L (ref 0–40)
Albumin: 4.3 g/dL (ref 3.9–4.9)
Alkaline Phosphatase: 90 [IU]/L (ref 44–121)
Bilirubin Total: 0.4 mg/dL (ref 0.0–1.2)
Bilirubin, Direct: 0.09 mg/dL (ref 0.00–0.40)
Total Protein: 6.2 g/dL (ref 6.0–8.5)

## 2023-02-05 ENCOUNTER — Telehealth: Payer: Self-pay | Admitting: Pharmacist

## 2023-02-05 NOTE — Telephone Encounter (Signed)
 Called pt to schedule appointment with Lipid clinic, N/A LVM to call back.

## 2023-02-22 DIAGNOSIS — H6691 Otitis media, unspecified, right ear: Secondary | ICD-10-CM | POA: Diagnosis not present

## 2023-02-22 DIAGNOSIS — H938X3 Other specified disorders of ear, bilateral: Secondary | ICD-10-CM | POA: Diagnosis not present

## 2023-02-22 DIAGNOSIS — H6123 Impacted cerumen, bilateral: Secondary | ICD-10-CM | POA: Diagnosis not present

## 2023-03-07 ENCOUNTER — Telehealth (INDEPENDENT_AMBULATORY_CARE_PROVIDER_SITE_OTHER): Payer: Self-pay | Admitting: Otolaryngology

## 2023-03-07 NOTE — Telephone Encounter (Signed)
Confirmed appt and address for 03/08/2023.

## 2023-03-08 ENCOUNTER — Encounter (INDEPENDENT_AMBULATORY_CARE_PROVIDER_SITE_OTHER): Payer: Self-pay

## 2023-03-08 ENCOUNTER — Ambulatory Visit (INDEPENDENT_AMBULATORY_CARE_PROVIDER_SITE_OTHER): Payer: BC Managed Care – PPO

## 2023-03-08 VITALS — BP 134/83 | HR 65 | Ht 63.0 in | Wt 185.0 lb

## 2023-03-08 DIAGNOSIS — H6123 Impacted cerumen, bilateral: Secondary | ICD-10-CM

## 2023-03-08 DIAGNOSIS — J310 Chronic rhinitis: Secondary | ICD-10-CM | POA: Diagnosis not present

## 2023-03-08 DIAGNOSIS — R0981 Nasal congestion: Secondary | ICD-10-CM | POA: Diagnosis not present

## 2023-03-08 DIAGNOSIS — H6983 Other specified disorders of Eustachian tube, bilateral: Secondary | ICD-10-CM | POA: Diagnosis not present

## 2023-03-09 DIAGNOSIS — J310 Chronic rhinitis: Secondary | ICD-10-CM | POA: Insufficient documentation

## 2023-03-09 DIAGNOSIS — H6983 Other specified disorders of Eustachian tube, bilateral: Secondary | ICD-10-CM | POA: Insufficient documentation

## 2023-03-09 DIAGNOSIS — H6123 Impacted cerumen, bilateral: Secondary | ICD-10-CM | POA: Insufficient documentation

## 2023-03-09 NOTE — Progress Notes (Signed)
Patient ID: Traci Roy, female   DOB: 1960/09/28, 63 y.o.   MRN: [id number]  CC: Clogging sensation in the ears  HPI:  Traci Roy is a 63 y.o. female who presents today complaining of clogging sensation in her ears for the past month.  Her symptoms started after an upper respiratory infection.  She was diagnosed with middle ear effusion, and was treated with antibiotics.  However, she continues to be symptomatic.  The patient has no recent history of otitis media or otitis externa.  She has a history of environmental allergies.  She uses over-the-counter medication as needed.  The patient underwent adenotonsillectomy surgery as a child.  She has no other ENT surgery.  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   CHF (congestive heart failure) (HCC)    Depression    takes Lexapro daily   Dizziness    occasionally and no meds   Flu    end of Dec 2014   GERD (gastroesophageal reflux disease)    occasionally and no meds required   History of bronchitis    last time many yrs ago   History of duodenal ulcer    History of shingles 3-5 YRS AGO   Hypertension    takes Losartan and HCTZ daily   Joint pain    Restless legs     Past Surgical History:  Procedure Laterality Date   BREAST ENHANCEMENT SURGERY Bilateral    CESAREAN SECTION     X 1   COLONOSCOPY     CORONARY PRESSURE/FFR STUDY N/A 12/18/2019   Procedure: INTRAVASCULAR PRESSURE WIRE/FFR STUDY;  Surgeon: Corky Crafts, MD;  Location: [facility] INVASIVE CV LAB;  Service: Cardiovascular;  Laterality: N/A;   CORONARY STENT INTERVENTION N/A 12/18/2019   Procedure: CORONARY STENT INTERVENTION;  Surgeon: Corky Crafts, MD;  Location: [facility] INVASIVE CV LAB;  Service: Cardiovascular;  Laterality: N/A;   enlarged bladder as a child     HAD SURGERY TO ENLARGE BLADDER   LAPAROSCOPIC GASTRIC SLEEVE RESECTION N/A 10/24/2021   Procedure: LAPAROSCOPIC SLEEVE  GASTRECTOMY WITH HIATAL HERNIA REPAIR;  Surgeon: Berna Bue, MD;  Location: [facility] OR[facility];  Service: General;  Laterality: N/A;   RIGHT/LEFT HEART CATH AND CORONARY ANGIOGRAPHY N/A 12/18/2019   Procedure: RIGHT/LEFT HEART CATH AND CORONARY ANGIOGRAPHY;  Surgeon: Corky Crafts, MD;  Location[facility] INVASIVE CV LAB;  Service: Cardiovascular;  Laterality: N/A;   TONSILLECTOMY     TOTAL HIP ARTHROPLASTY Right 04/14/2013   Procedure: TOTAL HIP ARTHROPLASTY ANTERIOR APPROACH;  Surgeon: Velna Ochs, MD;  Location: [facility] OR;  Service: Orthopedics;  Laterality: Right;   UPPER GI ENDOSCOPY N/A 10/24/2021   Procedure: UPPER GI ENDOSCOPY;  Surgeon: Berna Bue, MD;  Location: [facility] ORS;  Service: General;  Laterality: N/A;    Family History  Family history unknown: Yes    Social History:  reports that she has never smoked. She has never used smokeless tobacco. She reports current alcohol use. She reports that she does not use drugs.  Allergies:  Allergies  Allergen Reactions   Lisinopril Cough   Adhesive [Tape] Itching and Rash    Itching and breaks out   Silicone Itching and Rash    Itching and breaks out    Prior to Admission medications   Medication Sig Start Date End Date Taking? Authorizing Provider  acetaminophen (TYLENOL) 500 MG tablet  Take 500 mg by mouth every 6 (six) hours as needed for moderate pain or mild pain.   Yes [provider]  aspirin EC 81 MG tablet Take 1 tablet (81 mg total) by mouth daily. Swallow whole. 12/19/21  Yes Jake Bathe, MD  escitalopram (LEXAPRO) 20 MG tablet Take 20 mg by mouth daily. 09/04/19  Yes [provider]  gabapentin (NEURONTIN) 100 MG capsule Take 2 capsules (200 mg total) by mouth every 12 (twelve) hours. 10/25/21  Yes Berna Bue, MD  magnesium oxide (MAG-OX) 400 (240 Mg) MG tablet Take 500 mg by mouth daily.   Yes [provider]  Melatonin 5 MG CAPS Take 5 mg by mouth at bedtime.   Yes [provider]  nitroGLYCERIN (NITROSTAT) 0.4 MG SL tablet PLACE 1 TABLET (0.4 MG TOTAL) UNDER THE TONGUE EVERY FIVE MINUTES AS NEEDED FOR CHEST PAIN. 12/18/19  Yes Bhagat, Bhavinkumar, PA  sacubitril-valsartan (ENTRESTO) 24-26 MG Take 1 tablet by mouth 2 (two) times daily. 06/06/22  Yes Jake Bathe, MD    Blood pressure 134/83, pulse 65, height 5\' 3"  (1.6 m), weight 185 lb (83.9 kg), last menstrual period 02/05/2014, SpO2 96%. Exam: General: Communicates without difficulty, well nourished, no acute distress. Head: Normocephalic, no evidence injury, no tenderness, facial buttresses intact without stepoff. Face/sinus: No tenderness to palpation and percussion. Facial movement is normal and symmetric. Eyes: PERRL, EOMI. No scleral icterus, conjunctivae clear. Neuro: CN II exam reveals vision grossly intact.  No nystagmus at any point of gaze. Ears: Auricles well formed without lesions. EAC: Bilateral cerumen impaction.  Under the operating microscope, the cerumen is carefully removed with a combination of cerumen currette, alligator forceps, and suction catheters.  After the cerumen is removed, the TMs are noted to be normal.  Nose: External evaluation reveals normal support and skin without lesions.  Dorsum is intact.  Anterior rhinoscopy reveals congested mucosa over anterior aspect of inferior turbinates and intact septum.  No purulence noted. Oral:  Oral cavity and oropharynx are intact, symmetric, without erythema or edema.  Mucosa is moist without lesions. Neck: Full range of motion without pain.  There is no significant lymphadenopathy.  No masses palpable.  Thyroid bed within normal limits to palpation.  Parotid glands and submandibular glands equal bilaterally without mass.  Trachea is midline. Neuro:  CN 2-12 grossly intact.   Assessment: 1.  Bilateral cerumen impaction.  After the cerumen removal procedure, both tympanic membranes and middle ear spaces are noted to be normal.  No middle ear  effusion or infection is noted today. 2.  Bilateral clinical eustachian tube dysfunction. 3.  Chronic rhinitis with nasal mucosal congestion.  Plan: 1.  Otomicroscopy with bilateral cerumen disimpaction. 2.  The physical exam findings are reviewed with the patient.  She is reassured that no infection or middle ear effusion is noted today. 3.  Flonase nasal spray 2 sprays each nostril daily.  The importance of consistent daily use is discussed. 4.  Valsalva exercise multiple times a day. 5.  The patient will return for reevaluation in 6 weeks.  Zyrell Carmean W Fadumo Heng 03/09/2023, 11:02 AM

## 2023-03-12 ENCOUNTER — Ambulatory Visit (INDEPENDENT_AMBULATORY_CARE_PROVIDER_SITE_OTHER): Payer: BC Managed Care – PPO | Admitting: Plastic Surgery

## 2023-03-12 ENCOUNTER — Encounter: Payer: Self-pay | Admitting: Plastic Surgery

## 2023-03-12 VITALS — HR 66 | Ht 62.0 in | Wt 191.0 lb

## 2023-03-12 DIAGNOSIS — M546 Pain in thoracic spine: Secondary | ICD-10-CM

## 2023-03-12 DIAGNOSIS — M542 Cervicalgia: Secondary | ICD-10-CM | POA: Diagnosis not present

## 2023-03-12 DIAGNOSIS — M793 Panniculitis, unspecified: Secondary | ICD-10-CM | POA: Insufficient documentation

## 2023-03-12 DIAGNOSIS — R21 Rash and other nonspecific skin eruption: Secondary | ICD-10-CM | POA: Diagnosis not present

## 2023-03-12 DIAGNOSIS — M549 Dorsalgia, unspecified: Secondary | ICD-10-CM | POA: Insufficient documentation

## 2023-03-12 DIAGNOSIS — Z9884 Bariatric surgery status: Secondary | ICD-10-CM

## 2023-03-12 DIAGNOSIS — G8929 Other chronic pain: Secondary | ICD-10-CM

## 2023-03-12 NOTE — Progress Notes (Signed)
 Patient ID: Traci Roy, female    DOB: 04-11-1960, 63 y.o.   MRN: [id number]   Chief Complaint  Patient presents with   Advice Only    The patient is a 63 year old female here for evaluation of her abdomen.  She is 5 feet 2 inches tall and weighs 191 pounds.  She was over 250 pounds in September.  She had a gastric bypass with Mitzie Freund and has done really well with weight reduction.  She has not had the equivalent of 6 months of stable weight to know if she is where she is going to reside from a weight standpoint.  She does have a pannus.  She complains of back and neck pain.  She complains of difficulty fitting into close now that she is losing weight and rashes.  She wears powders and creams constantly to try and alleviate the skin breakdown from the irritation.    Review of Systems  Constitutional: Negative.   Eyes: Negative.   Respiratory: Negative.    Cardiovascular: Negative.   Gastrointestinal: Negative.   Endocrine: Negative.   Genitourinary: Negative.   Musculoskeletal:  Positive for back pain and neck pain.  Skin:  Positive for rash.    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   CHF (congestive heart failure) (HCC)    Depression    takes Lexapro  daily   Dizziness    occasionally and no meds   Flu    end of Dec 2014   GERD (gastroesophageal reflux disease)    occasionally and no meds required   History of bronchitis    last time many yrs ago   History of duodenal ulcer    History of shingles 3-5 YRS AGO   Hypertension    takes Losartan  and HCTZ daily   Joint pain    Restless legs     Past Surgical History:  Procedure Laterality Date   BREAST ENHANCEMENT SURGERY Bilateral    CESAREAN SECTION     X 1   COLONOSCOPY     CORONARY PRESSURE/FFR STUDY N/A 12/18/2019   Procedure: INTRAVASCULAR PRESSURE WIRE/FFR STUDY;  Surgeon: Dann Candyce RAMAN, MD;  Location: [facility] INVASIVE CV  LAB;  Service: Cardiovascular;  Laterality: N/A;   CORONARY STENT INTERVENTION N/A 12/18/2019   Procedure: CORONARY STENT INTERVENTION;  Surgeon: Dann Candyce RAMAN, MD;  Location: [facility] INVASIVE CV LAB;  Service: Cardiovascular;  Laterality: N/A;   enlarged bladder as a child     HAD SURGERY TO ENLARGE BLADDER   LAPAROSCOPIC GASTRIC SLEEVE RESECTION N/A 10/24/2021   Procedure: LAPAROSCOPIC SLEEVE GASTRECTOMY WITH HIATAL HERNIA REPAIR;  Surgeon: Freund Mitzie LABOR, MD;  Location: [facility] ORS;  Service: General;  Laterality: N/A;   RIGHT/LEFT HEART CATH AND CORONARY ANGIOGRAPHY N/A 12/18/2019   Procedure: RIGHT/LEFT HEART CATH AND CORONARY ANGIOGRAPHY;  Surgeon: Dann Candyce RAMAN, MD;  Location: [facility] INVASIVE CV LAB;  Service: Cardiovascular;  Laterality: N/A;   TONSILLECTOMY     TOTAL HIP ARTHROPLASTY Right 04/14/2013   Procedure: TOTAL HIP ARTHROPLASTY ANTERIOR APPROACH;  Surgeon: Maude KANDICE Herald, MD;  Location: [facility] OR;  Service: Orthopedics;  Laterality: Right;   UPPER GI ENDOSCOPY N/A 10/24/2021   Procedure: UPPER GI ENDOSCOPY;  Surgeon: Freund Mitzie LABOR, MD;  Location: [facility] ORS;  Service: General;  Laterality: N/A;      Current Outpatient Medications:    acetaminophen  (  TYLENOL ) 500 MG tablet, Take 500 mg by mouth every 6 (six) hours as needed for moderate pain or mild pain., Disp: , Rfl:    aspirin  EC 81 MG tablet, Take 1 tablet (81 mg total) by mouth daily. Swallow whole., Disp: 30 tablet, Rfl: 12   escitalopram  (LEXAPRO ) 20 MG tablet, Take 20 mg by mouth daily., Disp: , Rfl:    gabapentin  (NEURONTIN ) 100 MG capsule, Take 2 capsules (200 mg total) by mouth every 12 (twelve) hours., Disp: 20 capsule, Rfl: 0   magnesium  oxide (MAG-OX) 400 (240 Mg) MG tablet, Take 500 mg by mouth daily., Disp: , Rfl:    Melatonin 5 MG CAPS, Take 5 mg by mouth at bedtime., Disp: , Rfl:    nitroGLYCERIN  (NITROSTAT ) 0.4 MG SL tablet, PLACE 1 TABLET (0.4 MG TOTAL) UNDER THE TONGUE EVERY FIVE MINUTES AS NEEDED FOR CHEST  PAIN., Disp: 25 tablet, Rfl: 0   sacubitril -valsartan  (ENTRESTO ) 24-26 MG, Take 1 tablet by mouth 2 (two) times daily., Disp: 28 tablet, Rfl: 0   Objective:   Vitals:   03/12/23 1029  Pulse: 66  SpO2: 95%    Physical Exam Vitals reviewed.  Constitutional:      Appearance: Normal appearance.  Cardiovascular:     Rate and Rhythm: Normal rate.     Pulses: Normal pulses.  Pulmonary:     Effort: Pulmonary effort is normal.  Abdominal:     General: There is no distension.     Palpations: Abdomen is soft.     Tenderness: There is no abdominal tenderness.  Musculoskeletal:        General: No swelling, tenderness or deformity.  Skin:    General: Skin is warm.     Capillary Refill: Capillary refill takes less than 2 seconds.  Neurological:     Mental Status: She is alert and oriented to person, place, and time.  Psychiatric:        Mood and Affect: Mood normal.        Behavior: Behavior normal.        Thought Content: Thought content normal.        Judgment: Judgment normal.     Assessment & Plan:  Panniculitis  Chronic bilateral thoracic back pain  We talked about the different options.  I definitely want her to continue with her late weight loss program over the next 3 to 4 months and then come and see us  so we know where she is going to have her wait.  Panniculectomy is a possibility.  Traci RAMAN Zackory Pudlo, DO

## 2023-03-13 DIAGNOSIS — L282 Other prurigo: Secondary | ICD-10-CM | POA: Diagnosis not present

## 2023-03-18 ENCOUNTER — Institutional Professional Consult (permissible substitution) (INDEPENDENT_AMBULATORY_CARE_PROVIDER_SITE_OTHER): Payer: BC Managed Care – PPO

## 2023-04-02 ENCOUNTER — Ambulatory Visit: Payer: BC Managed Care – PPO

## 2023-04-02 NOTE — Progress Notes (Deleted)
 Patient ID: [name]                 DOB: 09/22/1960                    MRN: [id number]      HPI: Traci Roy is a 63 y.o. female patient referred to lipid clinic by Jari Favre, PA. PMH is significant for coronary artery disease with prior LAD stent placement in November 2021, depression, HFmrEF, HTN, s/p gastric bypass in 2024.   Previously on atorvastatin Intolerances, why did she stop atorvastatin?   Reviewed options for lowering LDL cholesterol, including ezetimibe, PCSK-9 inhibitors, bempedoic acid and inclisiran.  Discussed mechanisms of action, dosing, side effects and potential decreases in LDL cholesterol.  Also reviewed cost information and potential options for patient assistance.   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   CHF (congestive heart failure) (HCC)    Depression    takes Lexapro daily   Dizziness    occasionally and no meds   Flu    end of Dec 2014   GERD (gastroesophageal reflux disease)    occasionally and no meds required   History of bronchitis    last time many yrs ago   History of duodenal ulcer    History of shingles 3-5 YRS AGO   Hypertension    takes Losartan and HCTZ daily   Joint pain    Restless legs     Current Outpatient Medications on File Prior to Visit  Medication Sig Dispense Refill   acetaminophen (TYLENOL) 500 MG tablet Take 500 mg by mouth every 6 (six) hours as needed for moderate pain or mild pain.     aspirin EC 81 MG tablet Take 1 tablet (81 mg  total) by mouth daily. Swallow whole. 30 tablet 12   escitalopram (LEXAPRO) 20 MG tablet Take 20 mg by mouth daily.     gabapentin (NEURONTIN) 100 MG capsule Take 2 capsules (200 mg total) by mouth every 12 (twelve) hours. 20 capsule 0   magnesium oxide (MAG-OX) 400 (240 Mg) MG tablet Take 500 mg by mouth daily.     Melatonin 5 MG CAPS Take 5 mg by mouth at bedtime.     nitroGLYCERIN (NITROSTAT) 0.4 MG SL tablet PLACE 1 TABLET (0.4 MG TOTAL) UNDER THE TONGUE EVERY FIVE MINUTES AS NEEDED FOR CHEST PAIN. 25 tablet 0   sacubitril-valsartan (ENTRESTO) 24-26 MG Take 1 tablet by mouth 2 (two) times daily. 28 tablet 0   No current facility-administered medications on file prior to visit.    Allergies  Allergen Reactions   Lisinopril Cough   Adhesive [Tape] Itching and Rash    Itching and breaks out   Silicone Itching and Rash    Itching and breaks out    Assessment/Plan:  1. Hyperlipidemia -  No problem-specific Assessment & Plan notes found for this  encounter.    Thank you,  Olene Floss, Pharm.D, BCACP, CPP [facility] [address] [place], [place] [address]  Phone: [phone]; Fax: [phone]

## 2023-04-08 ENCOUNTER — Encounter: Payer: Self-pay | Admitting: Skilled Nursing Facility1

## 2023-04-08 ENCOUNTER — Encounter: Payer: BC Managed Care – PPO | Attending: Surgery | Admitting: Skilled Nursing Facility1

## 2023-04-08 DIAGNOSIS — E118 Type 2 diabetes mellitus with unspecified complications: Secondary | ICD-10-CM | POA: Insufficient documentation

## 2023-04-08 DIAGNOSIS — E669 Obesity, unspecified: Secondary | ICD-10-CM | POA: Diagnosis not present

## 2023-04-08 NOTE — Progress Notes (Signed)
 Bariatric Nutrition Follow-Up Visit Medical Nutrition Therapy   Surgery date: 10/24/2021 Surgery type: Sleeve  NUTRITION ASSESSMENT   Anthropometrics  Start weight at [facility]: 225.9 lbs (date: 08/01/2021)  Height: 63 in Weight today: pt declined   Clinical  Medical hx: HTN, Obesity, Arthritis, CHF, Depression, DM, GERD Medications: Escitalopram, magnesium, low dose aspirin, amoxicillin, entresto, probiotic  Labs: 03/07/2021: glucose 129;  Notable signs/symptoms: none noted Any previous deficiencies? No    Body Composition Scale 11/07/2021 12/19/2021 02/15/2022  Current Body Weight 209.8 198.8 190  Total Body Fat % 43.5 42.1 40.8  Visceral Fat 15 14 13   Fat-Free Mass % 56.4 57.8 59.1   Total Body Water % 42.7 43.4 44  Muscle-Mass lbs 28.5 28.4 28.3  BMI 36.7 34.8 33.3  Body Fat Displacement            Torso  lbs 56.4 51.8 48.1         Left Leg  lbs 11.2 10.3 9.6         Right Leg  lbs 11.2 10.3 9.6         Left Arm  lbs 5.6 5.1 4.8         Right Arm   lbs 5.6 5.1 4.8     Lifestyle & Dietary Hx  Pt states she is really feeling lost stating she feels she needs to start over stating she has gained weight.  Pt states she does have a lot of stress worrying about things outside of her control.  Pt states she reads an preys for stress relief stating she does not think it is stress causing her to feel extreme hunger especially at night.  Pt states she tries to stop eating at 4-5 pm and goes to bed around 11pm.   Estimated daily fluid intake: 40-60 oz Estimated daily protein intake: 60 g Supplements: forgetting often multi and no calcium  Current average weekly physical activity: ADL's   24-Hr Dietary Recall First Meal 9-10 am: premier protein shake or 6 ounces orange juice, english muffin + butter + 2 slices of bacon or protein bar and berries and coffee Snack 1 pm: protein shake  Snack 3-4 pm: 330 calorie chocolate covered almonds or protein shake   Third Meal by 6 pm: veggie  soup + crackers or chicken wings or meat sauce + some spaghetti Snack: sugar free coco and sugar free whipped cream or nuts or protein shake  Beverages: water, orange, unsweet tea   Post-Op Goals/ Signs/ Symptoms Using straws: no Drinking while eating: no Chewing/swallowing difficulties: no Changes in vision: no Changes to mood/headaches: no Hair loss/changes to skin/nails: no Difficulty focusing/concentrating: no Sweating: no Limb weakness: no Dizziness/lightheadedness: no Palpitations: no  Carbonated/caffeinated beverages: no N/V/D/C/Gas: no Abdominal pain: no Dumping syndrome: no    NUTRITION DIAGNOSIS  Overweight/obesity ([place]-3.3) related to past poor dietary habits and physical inactivity as evidenced by completed bariatric surgery and following dietary guidelines for continued weight loss and healthy nutrition status.     NUTRITION INTERVENTION Nutrition counseling (C-1) and education (E-2) to facilitate bariatric surgery goals, including: Dietitian worked with pt on their emotional relationship with food as this is integral to the pts maintenance of a healthy lifestyle long term.  Identify Triggers: What leads one to emotionally eat? Is there a sudden onset of cravings? Is there rapid consumption?  Awareness  Explore and identify emotional triggers that lead to certain eating behaviors. These triggers could be stress, boredom, sadness, loneliness, or a plethora of other emotions.  Mindful Eating: Encouraged mindfulness during meals. This involves paying attention to the sensory experience of eating, such as the taste, texture, and smell of food. It can help increase awareness of hunger and satisfaction cues. Gave pt mindfulness exercise   Emotional Awareness: Fostered emotional awareness by helping the patient recognize and express their emotions in ways other than turning to food (moving through the emotion rather than stuffing it down with food). Encouraged journaling,  talking to a therapist, or engaging in activities that provide emotional support and assist the pt in more appropriately dealing with their feelings.  Cognitive Behavioral Techniques: Introduce cognitive-behavioral techniques to challenge and change negative thought patterns related to food. Help the patient develop healthier coping mechanisms for dealing with emotions. Establish Healthy Coping Strategies:  Assist the patient in finding alternative coping strategies for managing emotions without resorting to food. This could include exercise, meditation, deep breathing, or engaging in hobbies.  Support System: Encourage the patient to build a strong support system, whether it's through friends, family, or support groups. Having a network can provide emotional support during challenging times.  Self-Compassion: Foster self-compassion and encourage the patient to be kind to themselves. Building a positive self-image can contribute to healthier relationships with food.  Gradual Changes: Support the patient in making gradual, sustainable changes to their eating habits. Small steps over time are more likely to lead to long-term success.  Professional Counseling: Advised the patient work with a Armed forces technical officer. Professional guidance can provide additional tools and strategies for addressing emotional issues. As a dietitian I cannot offer mental health advice  Nutritional Education: Provided education on the nutritional aspects of food and help the patient understand the role of food in nourishing the body rather than solely as a way to cope with emotions.  Goals: I will find a mental heath professional to work with  Handouts Previously Provided Include  Bariatric 6 month plan   Learning Style & Readiness for Change Teaching method utilized: Visual & Auditory  Demonstrated degree of understanding via: Teach Back  Readiness Level: contemplative  Barriers to  learning/adherence to lifestyle change: anxiety  RD's Notes for Next Visit Assess adherence to pt chosen goals.   MONITORING & EVALUATION Dietary intake, weekly physical activity, body weight  Next Steps Patient is to follow-up as needed

## 2023-04-09 DIAGNOSIS — M4316 Spondylolisthesis, lumbar region: Secondary | ICD-10-CM | POA: Diagnosis not present

## 2023-04-09 DIAGNOSIS — Z6832 Body mass index (BMI) 32.0-32.9, adult: Secondary | ICD-10-CM | POA: Diagnosis not present

## 2023-04-22 ENCOUNTER — Telehealth (INDEPENDENT_AMBULATORY_CARE_PROVIDER_SITE_OTHER): Payer: Self-pay | Admitting: Otolaryngology

## 2023-04-22 NOTE — Telephone Encounter (Signed)
 LVM to confirm appt & location [phone] afm

## 2023-04-23 ENCOUNTER — Ambulatory Visit (INDEPENDENT_AMBULATORY_CARE_PROVIDER_SITE_OTHER): Payer: BC Managed Care – PPO

## 2023-04-29 ENCOUNTER — Encounter (INDEPENDENT_AMBULATORY_CARE_PROVIDER_SITE_OTHER): Payer: Self-pay | Admitting: Cardiology

## 2023-04-29 DIAGNOSIS — M1612 Unilateral primary osteoarthritis, left hip: Secondary | ICD-10-CM | POA: Diagnosis not present

## 2023-04-29 DIAGNOSIS — I2510 Atherosclerotic heart disease of native coronary artery without angina pectoris: Secondary | ICD-10-CM | POA: Diagnosis not present

## 2023-04-29 DIAGNOSIS — M1712 Unilateral primary osteoarthritis, left knee: Secondary | ICD-10-CM | POA: Diagnosis not present

## 2023-04-30 NOTE — Telephone Encounter (Signed)
 Please see the MyChart message reply(ies) for my assessment and plan. Traci Roy,  I am not opposed to you taking the ibuprofen or prednisone for your discomfort.  My recommendation would be to take it as directed from the orthopedist.  Often we asked that our cardiac patients do not take these medications long-term chronically.  My guess is that they would like you to take it for several days/2 or 3 weeks duration.  This is very reasonable.  Watch for any signs of bleeding.   This patient gave consent for this Medical Advice Message and is aware that it may result in a bill to [name], as well as the possibility of receiving a bill for a co-payment or deductible. They are an established patient, but are not seeking medical advice exclusively about a problem treated during an in person or video visit in the last seven days. I did not recommend an in person or video visit within seven days of my reply.    I spent a total of 5 minutes cumulative time within 7 days through [employer].  Donato Schultz, MD

## 2023-06-11 ENCOUNTER — Ambulatory Visit: Payer: BC Managed Care – PPO | Admitting: Plastic Surgery

## 2023-06-28 ENCOUNTER — Other Ambulatory Visit (HOSPITAL_COMMUNITY): Payer: Self-pay

## 2023-06-28 ENCOUNTER — Telehealth: Payer: Self-pay | Admitting: Cardiology

## 2023-06-28 ENCOUNTER — Telehealth: Payer: Self-pay | Admitting: Pharmacy Technician

## 2023-06-28 NOTE — Telephone Encounter (Signed)
 PAP: Patient assistance application for Entresto  through [employer] has been mailed to pt's home address on file.

## 2023-06-28 NOTE — Telephone Encounter (Signed)
 Pt c/o medication issue:  1. Name of Medication:  sacubitril -valsartan  (ENTRESTO ) 24-26 MG   2. How are you currently taking this medication (dosage and times per day)?   3. Are you having a reaction (difficulty breathing--STAT)?   4. What is your medication issue?   Patient says patient assistance enrollment with [employer] ended on 5/04 and she would like assistance with re-applying.

## 2023-07-04 DIAGNOSIS — I110 Hypertensive heart disease with heart failure: Secondary | ICD-10-CM | POA: Diagnosis not present

## 2023-07-04 DIAGNOSIS — F331 Major depressive disorder, recurrent, moderate: Secondary | ICD-10-CM | POA: Diagnosis not present

## 2023-07-04 DIAGNOSIS — N1831 Chronic kidney disease, stage 3a: Secondary | ICD-10-CM | POA: Diagnosis not present

## 2023-07-04 DIAGNOSIS — R739 Hyperglycemia, unspecified: Secondary | ICD-10-CM | POA: Diagnosis not present

## 2023-07-04 DIAGNOSIS — F419 Anxiety disorder, unspecified: Secondary | ICD-10-CM | POA: Diagnosis not present

## 2023-07-04 DIAGNOSIS — R5383 Other fatigue: Secondary | ICD-10-CM | POA: Diagnosis not present

## 2023-07-04 DIAGNOSIS — I5020 Unspecified systolic (congestive) heart failure: Secondary | ICD-10-CM | POA: Diagnosis not present

## 2023-07-08 DIAGNOSIS — M25552 Pain in left hip: Secondary | ICD-10-CM | POA: Diagnosis not present

## 2023-07-10 DIAGNOSIS — R262 Difficulty in walking, not elsewhere classified: Secondary | ICD-10-CM | POA: Diagnosis not present

## 2023-07-10 DIAGNOSIS — M6281 Muscle weakness (generalized): Secondary | ICD-10-CM | POA: Diagnosis not present

## 2023-07-10 DIAGNOSIS — M7062 Trochanteric bursitis, left hip: Secondary | ICD-10-CM | POA: Diagnosis not present

## 2023-07-16 NOTE — Telephone Encounter (Signed)
 Called the patient to follow up since we have not heard back about their application

## 2023-07-18 ENCOUNTER — Other Ambulatory Visit (HOSPITAL_COMMUNITY): Payer: Self-pay

## 2023-07-18 ENCOUNTER — Encounter: Payer: Self-pay | Admitting: Pharmacy Technician

## 2023-07-18 MED ORDER — ENTRESTO 24-26 MG PO TABS: 1.0000 | ORAL_TABLET | Freq: Two times a day (BID) | ORAL | 1 refills | Status: DC

## 2023-07-18 MED ORDER — ENTRESTO 24-26 MG PO TABS: 1.0000 | ORAL_TABLET | Freq: Two times a day (BID) | ORAL | 1 refills | Status: AC

## 2023-07-18 NOTE — Addendum Note (Signed)
 Addended by: Debrah Fan, Tippi Mccrae L on: 07/18/2023 12:10 PM   Modules accepted: Orders

## 2023-07-18 NOTE — Telephone Encounter (Signed)
 RX sent to requested Pharmacy

## 2023-07-18 NOTE — Telephone Encounter (Signed)
   I called [facility] and gave them the coupon information. Patient has insurance. Copay is 75.00 on insurance. Copay is now 10.00 on coupon. Patient did not have insurance last year and that is how she got [employer] assistance last year. Patient happy with the 10.00 and wants rx [facility] [place].

## 2023-07-18 NOTE — Telephone Encounter (Signed)
 [facility] and [facility] and coupon. They said had to be ordered for tomorrow. Sent patient mychart

## 2023-07-18 NOTE — Telephone Encounter (Signed)
 Hi, can someone send in the [facility]rest[facility]  [facility] [place] please? [facility]

## 2023-07-18 NOTE — Addendum Note (Signed)
 Addended by: Debrah Fan, Eugine Bubb L on: 07/18/2023 12:08 PM   Modules accepted: Orders

## 2023-07-23 ENCOUNTER — Telehealth: Payer: Self-pay | Admitting: Cardiology

## 2023-07-23 NOTE — Telephone Encounter (Signed)
 Patient identification verified by 2 forms. Traci Duck, RN     Called and spoke to patient  Patient states:  -  at her last check she was told everything was fine with her and she wonders if she can go off of Entresto    Patient denies:  - abnormal symptoms at time of call.              Interventions/Plan: - Educated patient on importance of medication compliance with overall progress and that her consistent use of the Entresto  along with her current regime is most likely attributing to her current health status. Patient would still like question sent to provider for review.  - Encounter forwarded to primary cardiologist for review/recommendations.    Patient agrees with plan, no questions at this time

## 2023-07-23 NOTE — Telephone Encounter (Signed)
 Pt c/o medication issue:  1. Name of Medication: sacubitril -valsartan  (ENTRESTO ) 24-26 MG   2. How are you currently taking this medication (dosage and times per day)? As written   3. Are you having a reaction (difficulty breathing--STAT)? No   4. What is your medication issue? Pt called in stating at her last check she was told everything was fine with her and she wonders if she can go off of Entresto . Please advise.

## 2023-07-29 DIAGNOSIS — F4321 Adjustment disorder with depressed mood: Secondary | ICD-10-CM | POA: Diagnosis not present

## 2023-08-01 NOTE — Telephone Encounter (Signed)
 Left message to call back

## 2023-08-06 DIAGNOSIS — F4321 Adjustment disorder with depressed mood: Secondary | ICD-10-CM | POA: Diagnosis not present

## 2023-08-15 DIAGNOSIS — F4321 Adjustment disorder with depressed mood: Secondary | ICD-10-CM | POA: Diagnosis not present

## 2023-08-20 DIAGNOSIS — F4321 Adjustment disorder with depressed mood: Secondary | ICD-10-CM | POA: Diagnosis not present

## 2023-08-27 DIAGNOSIS — F4321 Adjustment disorder with depressed mood: Secondary | ICD-10-CM | POA: Diagnosis not present

## 2023-09-10 DIAGNOSIS — F4321 Adjustment disorder with depressed mood: Secondary | ICD-10-CM | POA: Diagnosis not present

## 2023-09-13 DIAGNOSIS — S93402A Sprain of unspecified ligament of left ankle, initial encounter: Secondary | ICD-10-CM | POA: Diagnosis not present

## 2023-09-17 DIAGNOSIS — F4321 Adjustment disorder with depressed mood: Secondary | ICD-10-CM | POA: Diagnosis not present

## 2023-09-23 DIAGNOSIS — M5459 Other low back pain: Secondary | ICD-10-CM | POA: Diagnosis not present

## 2023-09-23 DIAGNOSIS — M25552 Pain in left hip: Secondary | ICD-10-CM | POA: Diagnosis not present

## 2023-09-23 DIAGNOSIS — M25572 Pain in left ankle and joints of left foot: Secondary | ICD-10-CM | POA: Diagnosis not present

## 2023-09-25 DIAGNOSIS — M25572 Pain in left ankle and joints of left foot: Secondary | ICD-10-CM | POA: Diagnosis not present

## 2023-09-25 DIAGNOSIS — M25552 Pain in left hip: Secondary | ICD-10-CM | POA: Diagnosis not present

## 2023-10-02 DIAGNOSIS — F4321 Adjustment disorder with depressed mood: Secondary | ICD-10-CM | POA: Diagnosis not present

## 2023-10-08 ENCOUNTER — Other Ambulatory Visit: Payer: Self-pay

## 2023-10-08 DIAGNOSIS — F4321 Adjustment disorder with depressed mood: Secondary | ICD-10-CM | POA: Diagnosis not present

## 2023-10-08 DIAGNOSIS — I8391 Asymptomatic varicose veins of right lower extremity: Secondary | ICD-10-CM

## 2023-10-09 DIAGNOSIS — M47816 Spondylosis without myelopathy or radiculopathy, lumbar region: Secondary | ICD-10-CM | POA: Diagnosis not present

## 2023-10-14 ENCOUNTER — Encounter: Payer: Self-pay | Admitting: Cardiology

## 2023-10-15 DIAGNOSIS — F4321 Adjustment disorder with depressed mood: Secondary | ICD-10-CM | POA: Diagnosis not present

## 2023-10-28 DIAGNOSIS — L578 Other skin changes due to chronic exposure to nonionizing radiation: Secondary | ICD-10-CM | POA: Diagnosis not present

## 2023-10-28 DIAGNOSIS — F4321 Adjustment disorder with depressed mood: Secondary | ICD-10-CM | POA: Diagnosis not present

## 2023-10-28 DIAGNOSIS — L821 Other seborrheic keratosis: Secondary | ICD-10-CM | POA: Diagnosis not present

## 2023-10-28 DIAGNOSIS — L820 Inflamed seborrheic keratosis: Secondary | ICD-10-CM | POA: Diagnosis not present

## 2023-10-29 DIAGNOSIS — F4321 Adjustment disorder with depressed mood: Secondary | ICD-10-CM | POA: Diagnosis not present

## 2023-10-29 DIAGNOSIS — L820 Inflamed seborrheic keratosis: Secondary | ICD-10-CM | POA: Diagnosis not present

## 2023-10-29 DIAGNOSIS — L821 Other seborrheic keratosis: Secondary | ICD-10-CM | POA: Diagnosis not present

## 2023-10-29 DIAGNOSIS — L578 Other skin changes due to chronic exposure to nonionizing radiation: Secondary | ICD-10-CM | POA: Diagnosis not present

## 2023-11-05 DIAGNOSIS — F4321 Adjustment disorder with depressed mood: Secondary | ICD-10-CM | POA: Diagnosis not present

## 2023-11-06 ENCOUNTER — Ambulatory Visit (HOSPITAL_BASED_OUTPATIENT_CLINIC_OR_DEPARTMENT_OTHER): Payer: Self-pay | Admitting: Orthopaedic Surgery

## 2023-11-06 ENCOUNTER — Ambulatory Visit (INDEPENDENT_AMBULATORY_CARE_PROVIDER_SITE_OTHER): Admitting: Orthopaedic Surgery

## 2023-11-06 ENCOUNTER — Other Ambulatory Visit (HOSPITAL_BASED_OUTPATIENT_CLINIC_OR_DEPARTMENT_OTHER): Payer: Self-pay

## 2023-11-06 DIAGNOSIS — S76012A Strain of muscle, fascia and tendon of left hip, initial encounter: Secondary | ICD-10-CM

## 2023-11-06 MED ORDER — IBUPROFEN 800 MG PO TABS
800.0000 mg | ORAL_TABLET | Freq: Three times a day (TID) | ORAL | 0 refills | Status: AC
  Filled 2023-11-06: qty 30, 10d supply, fill #0

## 2023-11-06 MED ORDER — ACETAMINOPHEN 500 MG PO TABS
500.0000 mg | ORAL_TABLET | Freq: Three times a day (TID) | ORAL | 0 refills | Status: AC
  Filled 2023-11-06: qty 30, 10d supply, fill #0

## 2023-11-06 MED ORDER — ASPIRIN 325 MG PO TBEC
325.0000 mg | DELAYED_RELEASE_TABLET | Freq: Every day | ORAL | 0 refills | Status: DC
  Filled 2023-11-06: qty 14, 14d supply, fill #0

## 2023-11-06 MED ORDER — OXYCODONE HCL 5 MG PO TABS
5.0000 mg | ORAL_TABLET | ORAL | 0 refills | Status: AC | PRN
  Filled 2023-11-06: qty 30, 5d supply, fill #0

## 2023-11-06 NOTE — Progress Notes (Signed)
 Chief Complaint: Left hip pain     History of Present Illness:    Traci Roy is a 63 y.o. female presents today with lateral based hip pain which has been ongoing for the last several years.  Did have an injury in this late spring where she fell directly onto the side.  She was being seen by Dr. Daldorf who obtained an MRI.  She has not had an injection which only gave her 2 hours of relief.  She has been participating in physical therapy without significant relief.  She does enjoy woodworking for fun.  She does note a mildly antalgic and painful gait.  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   CHF (congestive heart failure) (HCC)    Depression    takes Lexapro  daily   Dizziness    occasionally and no meds   Flu    end of Dec 2014   GERD (gastroesophageal reflux disease)    occasionally and no meds required   History of bronchitis    last time many yrs ago   History of duodenal ulcer    History of shingles 3-5 YRS AGO   Hypertension    takes Losartan  and HCTZ daily   Joint pain    Restless legs    Past Surgical History:  Procedure Laterality Date   BREAST ENHANCEMENT SURGERY Bilateral    CESAREAN SECTION     X 1   COLONOSCOPY     CORONARY PRESSURE/FFR STUDY N/A 12/18/2019   Procedure: INTRAVASCULAR PRESSURE WIRE/FFR STUDY;  Surgeon: Dann Candyce RAMAN, MD;  Location: [facility] INVASIVE CV LAB;  Service: Cardiovascular;  Laterality: N/A;   CORONARY STENT INTERVENTION N/A 12/18/2019   Procedure: CORONARY STENT INTERVENTION;  Surgeon: Dann Candyce RAMAN, MD;  Location: [facility] INVASIVE CV LAB;  Service: Cardiovascular;  Laterality: N/A;   enlarged bladder as a child     HAD SURGERY TO ENLARGE BLADDER   LAPAROSCOPIC GASTRIC SLEEVE RESECTION N/A 10/24/2021   Procedure: LAPAROSCOPIC SLEEVE  GASTRECTOMY WITH HIATAL HERNIA REPAIR;  Surgeon: Signe Mitzie LABOR, MD;  Location: [facility] ORS;  Service: General;  Laterality: N/A;   RIGHT/LEFT HEART CATH AND CORONARY ANGIOGRAPHY N/A 12/18/2019   Procedure: RIGHT/LEFT HEART CATH AND CORONARY ANGIOGRAPHY;  Surgeon: Dann Candyce RAMAN, MD;  Location: [facility] INVASIVE CV LAB;  Service: Cardiovascular;  Laterality: N/A;   TONSILLECTOMY     TOTAL HIP ARTHROPLASTY Right 04/14/2013   Procedure: TOTAL HIP ARTHROPLASTY ANTERIOR APPROACH;  Surgeon: Maude KANDICE Herald, MD;  Location: [facility] OR;  Service: Orthopedics;  Laterality: Right;   UPPER GI ENDOSCOPY N/A 10/24/2021   Procedure: UPPER GI ENDOSCOPY;  Surgeon: Signe Mitzie LABOR, MD;  Location: [facility] ORS;  Service: General;  Laterality: N/A;   Social History   Socioeconomic History   Marital status: Married    Spouse name: Not on file   Number of children: Not on file   Years of education: Not on file   Highest education level: Not on file  Occupational History   Not on file  Tobacco Use   Smoking status: Never   Smokeless tobacco: Never  Vaping Use   Vaping status: Never Used  Substance and Sexual Activity   Alcohol use: Yes    Comment: glass of wine every  now and then   Drug use: No   Sexual activity: Yes    Birth control/protection: Other-see comments  Other Topics Concern   Not on file  Social History Narrative   Not on file   Social Drivers of Health   Financial Resource Strain: Not on file  Food Insecurity: No Food Insecurity (10/24/2021)   Hunger Vital Sign    Worried About Running Out of Food in the Last Year: Never true    Ran Out of Food in the Last Year: Never true  Transportation Needs: No Transportation Needs (10/24/2021)   PRAPARE - Administrator, Civil Service (Medical): No    Lack of Transportation (Non-Medical): No  Physical Activity: Not on file  Stress: Not on file  Social Connections: Unknown (06/20/2021)   [facility]   Social Network    Social  Network: Not on file   Family History  Family history unknown: Yes   Allergies  Allergen Reactions   Lisinopril Cough   Adhesive [Tape] Itching and Rash    Itching and breaks out   Silicone Itching and Rash    Itching and breaks out   Current Outpatient Medications  Medication Sig Dispense Refill   acetaminophen  (TYLENOL ) 500 MG tablet Take 1 tablet (500 mg total) by mouth every 8 (eight) hours for 10 days. 30 tablet 0   aspirin  EC 325 MG tablet Take 1 tablet (325 mg total) by mouth daily. 14 tablet 0   ibuprofen (ADVIL) 800 MG tablet Take 1 tablet (800 mg total) by mouth every 8 (eight) hours for 10 days. Please take with food, please alternate with acetaminophen  30 tablet 0   oxyCODONE  (ROXICODONE ) 5 MG immediate release tablet Take 1 tablet (5 mg total) by mouth every 4 (four) hours as needed for severe pain (pain score 7-10) or breakthrough pain. 30 tablet 0   acetaminophen  (TYLENOL ) 500 MG tablet Take 500 mg by mouth every 6 (six) hours as needed for moderate pain or mild pain.     aspirin  EC 81 MG tablet Take 1 tablet (81 mg total) by mouth daily. Swallow whole. 30 tablet 12   escitalopram  (LEXAPRO ) 20 MG tablet Take 20 mg by mouth daily.     gabapentin  (NEURONTIN ) 100 MG capsule Take 2 capsules (200 mg total) by mouth every 12 (twelve) hours. 20 capsule 0   magnesium  oxide (MAG-OX) 400 (240 Mg) MG tablet Take 500 mg by mouth daily.     Melatonin 5 MG CAPS Take 5 mg by mouth at bedtime.     nitroGLYCERIN  (NITROSTAT ) 0.4 MG SL tablet PLACE 1 TABLET (0.4 MG TOTAL) UNDER THE TONGUE EVERY FIVE MINUTES AS NEEDED FOR CHEST PAIN. 25 tablet 0   sacubitril -valsartan  (ENTRESTO ) 24-26 MG Take 1 tablet by mouth 2 (two) times daily. 180 tablet 1   No current facility-administered medications for this visit.   No results found.  Review of Systems:   A ROS was performed including pertinent positives and negatives as documented in the HPI.  Physical Exam :   Constitutional: NAD and appears  stated age Neurological: Alert and oriented Psych: Appropriate affect and cooperative Last menstrual period 02/05/2014.   Comprehensive Musculoskeletal Exam:    Left hip with tenderness about the greater trochanter and pain with resisted abduction, negative Trendelenburg gait.  30 degrees internal/external rotation of the left hip are without pain distal neurosensory exams intact   Imaging:   Xray (4 views left hip): Normal  MRI (left  left hip): Full-thickness tear of the gluteus medius minimus junction   I personally reviewed and interpreted the radiographs.   Assessment and Plan:   63 y.o. female with a full-thickness gluteus medius minimus tear of the left hip.  At this time she has trialed both an injection as well as physical therapy.  I did discuss that unfortunately her T1 view does show significant atrophy in the glue to this effect in addition to a repair I would recommend the gluteus maximus tendon transfer to aid in recovery and function.  I discussed the risks and limitations as well as associated recovery timeframe.  After discussion she would like to proceed  -Plan for left hip gluteus medius repair with gluteus maximus tendon transfer   After a lengthy discussion of treatment options, including risks, benefits, alternatives, complications of surgical and nonsurgical conservative options, the patient elected surgical repair.   The patient  is aware of the material risks  and complications including, but not limited to injury to adjacent structures, neurovascular injury, infection, numbness, bleeding, implant failure, thermal burns, stiffness, persistent pain, failure to heal, disease transmission from allograft, need for further surgery, dislocation, anesthetic risks, blood clots, risks of death,and others. The probabilities of surgical success and failure discussed with patient given their particular co-morbidities.The time and nature of expected rehabilitation and  recovery was discussed.The patient's questions were all answered preoperatively.  No barriers to understanding were noted. I explained the natural history of the disease process and Rx rationale.  I explained to the patient what I considered to be reasonable expectations given their personal situation.  The final treatment plan was arrived at through a shared patient decision making process model.    I personally saw and evaluated the patient, and participated in the management and treatment plan.  Elspeth Parker, MD Attending Physician, Orthopedic Surgery  This document was dictated using Dragon voice recognition software. A reasonable attempt at proof reading has been made to minimize errors.

## 2023-11-07 ENCOUNTER — Encounter: Admitting: Vascular Surgery

## 2023-11-07 ENCOUNTER — Encounter (HOSPITAL_COMMUNITY)

## 2023-11-12 DIAGNOSIS — F4321 Adjustment disorder with depressed mood: Secondary | ICD-10-CM | POA: Diagnosis not present

## 2023-11-14 ENCOUNTER — Telehealth (HOSPITAL_BASED_OUTPATIENT_CLINIC_OR_DEPARTMENT_OTHER): Payer: Self-pay | Admitting: *Deleted

## 2023-11-14 ENCOUNTER — Telehealth: Payer: Self-pay

## 2023-11-14 NOTE — Telephone Encounter (Signed)
   Pre-operative Risk Assessment    Patient Name: Traci Roy  DOB: 07/27/1960 MRN: [id number]   Date of last office visit: 12/21/22 TESSA CONTE, PA-C Date of next office visit: NONE   Request for Surgical Clearance    Procedure:  LEFT GLUTEUS MAXIMUS TENDON TRANSFER  Date of Surgery:  Clearance TBD                                Surgeon:  ELSPETH EWINGS, MD Surgeon's Group or Practice Name:  [facility] Phone number:  [phone] Fax number:  [phone]   Type of Clearance Requested:   - Medical  - Pharmacy:  Hold Aspirin  7 DAYS PRIOR   Type of Anesthesia:  General    Additional requests/questions:    SignedLucie DELENA Ku   11/14/2023, 3:05 PM

## 2023-11-14 NOTE — Telephone Encounter (Signed)
 Pt has been scheduled tele preop appt 11/21/23. Pt states surgeon is waiting for clearance before scheduling procedure. Med rec and consent are done.

## 2023-11-14 NOTE — Telephone Encounter (Signed)
 Pt has been scheduled tele preop appt 11/21/23. Pt states surgeon is waiting for clearance before scheduling procedure. Med rec and consent are done.      Patient Consent for Virtual Visit        Traci Roy has provided verbal consent on 11/14/2023 for a virtual visit (video or telephone).   CONSENT FOR VIRTUAL VISIT FOR:  Traci Roy  By participating in this virtual visit I agree to the following:  I hereby voluntarily request, consent and authorize [facility] and its employed or contracted physicians, physician assistants, nurse practitioners or other licensed health care professionals (the Practitioner), to provide me with telemedicine health care services (the "Services) as deemed necessary by the treating Practitioner. I acknowledge and consent to receive the Services by the Practitioner via telemedicine. I understand that the telemedicine visit will involve communicating with the Practitioner through live audiovisual communication technology and the disclosure of certain medical information by electronic transmission. I acknowledge that I have been given the opportunity to request an in-person assessment or other available alternative prior to the telemedicine visit and am voluntarily participating in the telemedicine visit.  I understand that I have the right to withhold or withdraw my consent to the use of telemedicine in the course of my care at any time, without affecting my right to future care or treatment, and that the Practitioner or I may terminate the telemedicine visit at any time. I understand that I have the right to inspect all information obtained and/or recorded in the course of the telemedicine visit and may receive copies of available information for a reasonable fee.  I understand that some of the potential risks of receiving the Services via telemedicine include:  Delay or interruption in medical evaluation due to technological equipment failure or  disruption; Information transmitted may not be sufficient (e.g. poor resolution of images) to allow for appropriate medical decision making by the Practitioner; and/or  In rare instances, security protocols could fail, causing a breach of personal health information.  Furthermore, I acknowledge that it is my responsibility to provide information about my medical history, conditions and care that is complete and accurate to the best of my ability. I acknowledge that Practitioner's advice, recommendations, and/or decision may be based on factors not within their control, such as incomplete or inaccurate data provided by me or distortions of diagnostic images or specimens that may result from electronic transmissions. I understand that the practice of medicine is not an exact science and that Practitioner makes no warranties or guarantees regarding treatment outcomes. I acknowledge that a copy of this consent can be made available to me via my patient portal [facility] MyChart), or I can request a printed copy by calling the office of [facility].    I understand that my insurance will be billed for this visit.   I have read or had this consent read to me. I understand the contents of this consent, which adequately explains the benefits and risks of the Services being provided via telemedicine.  I have been provided ample opportunity to ask questions regarding this consent and the Services and have had my questions answered to my satisfaction. I give my informed consent for the services to be provided through the use of telemedicine in my medical care

## 2023-11-14 NOTE — Telephone Encounter (Signed)
   Name: Traci Roy  DOB: [birth year]  MRN: [id number]  Primary Cardiologist: Oneil Parchment, MD   Preoperative team, please contact this patient and set up a phone call appointment for further preoperative risk assessment. Please obtain consent and complete medication review. Thank you for your help.  I confirm that guidance regarding antiplatelet and oral anticoagulation therapy has been completed and, if necessary, noted below.  Regarding ASA therapy, we recommend continuation of ASA throughout the perioperative period. However, if the surgeon feels that cessation of ASA is required in the perioperative period, it may be stopped 5-7 days prior to surgery with a plan to resume it as soon as felt to be feasible from a surgical standpoint in the post-operative period.    I also confirmed the patient resides in the state of [place] . As per [facility] Medical Board telemedicine laws, the patient must reside in the state in which the provider is licensed.   Josefa CHRISTELLA Beauvais, NP 11/14/2023, 3:53 PM [facility]

## 2023-11-21 ENCOUNTER — Ambulatory Visit: Attending: Student in an Organized Health Care Education/Training Program

## 2023-11-21 DIAGNOSIS — Z01810 Encounter for preprocedural cardiovascular examination: Secondary | ICD-10-CM

## 2023-11-21 NOTE — Progress Notes (Signed)
 Virtual Visit via Telephone Note   Because of Traci Roy co-morbid illnesses, she is at least at moderate risk for complications without adequate follow up.  This format is felt to be most appropriate for this patient at this time.  Due to technical limitations with video connection (technology), today's appointment will be conducted as an audio only telehealth visit, and Traci Roy verbally agreed to proceed in this manner.   All issues noted in this document were discussed and addressed.  No physical exam could be performed with this format.  Evaluation Performed:  Preoperative cardiovascular risk assessment _____________   Date:  11/21/2023   Patient ID:  Traci Roy, DOB 03-25-1960, MRN [id number] Patient Location:  Home Provider location:   Office  Primary Care Provider:  Dyane Anthony RAMAN, FNP Primary Cardiologist:  Oneil Parchment, MD  Chief Complaint / Patient Profile  63 y.o. y/o female with a h/o CAD s/p LAD stent in 12/2019, EF at that time was 40 to 45% who is pending left gluteus maximus tendon transfer and presents today for telephonic preoperative cardiovascular risk assessment. History of Present Illness  Traci Roy is a 63 y.o. female who presents via audio/video conferencing for a telehealth visit today.  Pt was last seen in cardiology clinic on 12/21/2022 by Traci Fabry, PA.  At that time Traci Roy was doing well.  The patient is now pending procedure as outlined above. Since her last visit, she has remained stable from a cardiac standpoint. Today denies chest pain, shortness of breath, lower extremity edema, fatigue, palpitations, melena, hematuria, hemoptysis, diaphoresis, weakness, presyncope, syncope, orthopnea, and PND. She is able to achieve 4 METs of activity with walking and household chores.  Past Medical History    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   CHF  (congestive heart failure) (HCC)    Depression    takes Lexapro  daily   Dizziness    occasionally and no meds   Flu    end of Dec 2014   GERD (gastroesophageal reflux disease)    occasionally and no meds required   History of bronchitis    last time many yrs ago   History of duodenal ulcer    History of shingles 3-5 YRS AGO   Hypertension    takes Losartan  and HCTZ daily   Joint pain    Restless legs    Past Surgical History:  Procedure Laterality Date   BREAST ENHANCEMENT SURGERY Bilateral    CESAREAN SECTION     X 1   COLONOSCOPY     CORONARY PRESSURE/FFR STUDY N/A 12/18/2019   Procedure: INTRAVASCULAR PRESSURE WIRE/FFR STUDY;  Surgeon: Dann Candyce RAMAN, MD;  Location: [facility] INVASIVE CV LAB;  Service: Cardiovascular;  Laterality: N/A;   CORONARY STENT INTERVENTION N/A 12/18/2019   Procedure: CORONARY STENT INTERVENTION;  Surgeon: Dann Candyce RAMAN, MD;  Location: [facility] INVASIVE CV LAB;  Service: Cardiovascular;  Laterality: N/A;   enlarged bladder as a child     HAD SURGERY TO ENLARGE BLADDER   LAPAROSCOPIC GASTRIC SLEEVE RESECTION N/A 10/24/2021   Procedure: LAPAROSCOPIC SLEEVE GASTRECTOMY WITH HIATAL HERNIA REPAIR;  Surgeon: Signe Mitzie LABOR, MD;  Location: [facility] ORS;  Service: General;  Laterality: N/A;   RIGHT/LEFT HEART CATH AND CORONARY ANGIOGRAPHY N/A 12/18/2019   Procedure: RIGHT/LEFT HEART CATH AND CORONARY ANGIOGRAPHY;  Surgeon: Dann Candyce RAMAN, MD;  Location: [facility] INVASIVE CV LAB;  Service: Cardiovascular;  Laterality: N/A;   TONSILLECTOMY     TOTAL HIP ARTHROPLASTY Right 04/14/2013   Procedure: TOTAL HIP ARTHROPLASTY ANTERIOR APPROACH;  Surgeon: Maude KANDICE Herald, MD;  Location: [facility] OR;  Service: Orthopedics;  Laterality: Right;   UPPER GI ENDOSCOPY N/A 10/24/2021   Procedure: UPPER GI ENDOSCOPY;  Surgeon: Signe Mitzie LABOR, MD;  Location: [facility] ORS;  Service: General;  Laterality: N/A;   Allergies Allergies  Allergen Reactions   Traci Roy Cough   Adhesive [Tape]  Itching and Rash    Itching and breaks out   Silicone Itching and Rash    Itching and breaks out   Home Medications    Prior to Admission medications   Medication Sig Start Date End Date Taking? Authorizing Provider  acetaminophen  (TYLENOL ) 500 MG tablet Take 500 mg by mouth every 6 (six) hours as needed for moderate pain or mild pain.    [provider]  aspirin  EC 325 MG tablet Take 1 tablet (325 mg total) by mouth daily. 11/06/23   Genelle Standing, MD  aspirin  EC 81 MG tablet Take 1 tablet (81 mg total) by mouth daily. Swallow whole. 12/19/21   Jeffrie Oneil BROCKS, MD  escitalopram  (LEXAPRO ) 20 MG tablet Take 20 mg by mouth daily. 09/04/19   [provider]  gabapentin  (NEURONTIN ) 100 MG capsule Take 2 capsules (200 mg total) by mouth every 12 (twelve) hours. 10/25/21   Signe Mitzie LABOR, MD  magnesium  oxide (MAG-OX) 400 (240 Mg) MG tablet Take 500 mg by mouth daily.    [provider]  Melatonin 5 MG CAPS Take 5 mg by mouth at bedtime.    [provider]  nitroGLYCERIN  (NITROSTAT ) 0.4 MG SL tablet PLACE 1 TABLET (0.4 MG TOTAL) UNDER THE TONGUE EVERY FIVE MINUTES AS NEEDED FOR CHEST PAIN. 12/18/19   Bhagat, Aleene, PA  oxyCODONE  (ROXICODONE ) 5 MG immediate release tablet Take 1 tablet (5 mg total) by mouth every 4 (four) hours as needed for severe pain (pain score 7-10) or breakthrough pain. 11/06/23   Genelle Standing, MD  sacubitril -valsartan  (ENTRESTO ) 24-26 MG Take 1 tablet by mouth 2 (two) times daily. 07/18/23   Jeffrie Oneil BROCKS, MD   Physical Exam  Vital Signs:  Traci Roy does not have vital signs available for review today. Given telephonic nature of communication, physical exam is limited. AAOx3. NAD. Normal affect.  Speech and respirations are unlabored. Accessory Clinical Findings  None Assessment & Plan  1.  Preoperative Cardiovascular Risk Assessment: Ms. Aten perioperative risk of a major cardiac event is 6.6% according to the  Revised Cardiac Risk Index (RCRI).  Therefore, she is at high risk for perioperative complications.   Her functional capacity is good at 6.12 METs according to the Duke Activity Status Index (DASI). Recommendations: According to ACC/AHA guidelines, no further cardiovascular testing needed.  The patient may proceed to surgery at acceptable risk.   Antiplatelet and/or Anticoagulation Recommendations: Regarding ASA therapy, we recommend continuation of ASA throughout the perioperative period.  However, if the surgeon feels that cessation of ASA is required in the perioperative period, it may be stopped 5-7 days prior to surgery with a plan to resume it as soon as felt to be feasible from a surgical standpoint in the post-operative period.   The patient was advised that if she develops new symptoms prior to surgery to contact our office to arrange for a follow-up visit, and she verbalized understanding.  A copy of  this note will be routed to requesting surgeon.  Time:   Today, I have spent 10 minutes with the patient with telehealth technology discussing medical history, symptoms, and management plan.    Haasini Patnaude D Ahnika Hannibal, NP  11/21/2023, 3:00 PM

## 2023-11-29 ENCOUNTER — Encounter: Payer: Self-pay | Admitting: Neurosurgery

## 2023-11-30 ENCOUNTER — Other Ambulatory Visit: Payer: Self-pay | Admitting: Neurosurgery

## 2023-11-30 DIAGNOSIS — M4316 Spondylolisthesis, lumbar region: Secondary | ICD-10-CM

## 2023-12-03 ENCOUNTER — Ambulatory Visit: Admitting: Plastic Surgery

## 2023-12-03 DIAGNOSIS — F4321 Adjustment disorder with depressed mood: Secondary | ICD-10-CM | POA: Diagnosis not present

## 2023-12-05 ENCOUNTER — Other Ambulatory Visit

## 2023-12-05 ENCOUNTER — Inpatient Hospital Stay: Admission: RE | Admit: 2023-12-05

## 2023-12-09 ENCOUNTER — Encounter: Payer: Self-pay | Admitting: Radiology

## 2023-12-10 DIAGNOSIS — F4321 Adjustment disorder with depressed mood: Secondary | ICD-10-CM | POA: Diagnosis not present

## 2023-12-16 DIAGNOSIS — N63 Unspecified lump in unspecified breast: Secondary | ICD-10-CM | POA: Diagnosis not present

## 2023-12-17 DIAGNOSIS — F4321 Adjustment disorder with depressed mood: Secondary | ICD-10-CM | POA: Diagnosis not present

## 2023-12-19 ENCOUNTER — Other Ambulatory Visit: Payer: Self-pay | Admitting: Family Medicine

## 2023-12-19 DIAGNOSIS — N63 Unspecified lump in unspecified breast: Secondary | ICD-10-CM

## 2023-12-23 DIAGNOSIS — L239 Allergic contact dermatitis, unspecified cause: Secondary | ICD-10-CM | POA: Diagnosis not present

## 2023-12-24 DIAGNOSIS — F4321 Adjustment disorder with depressed mood: Secondary | ICD-10-CM | POA: Diagnosis not present

## 2023-12-27 ENCOUNTER — Encounter: Payer: Self-pay | Admitting: Family Medicine

## 2023-12-30 DIAGNOSIS — E119 Type 2 diabetes mellitus without complications: Secondary | ICD-10-CM | POA: Diagnosis not present

## 2023-12-30 DIAGNOSIS — Z9884 Bariatric surgery status: Secondary | ICD-10-CM | POA: Diagnosis not present

## 2023-12-30 DIAGNOSIS — I10 Essential (primary) hypertension: Secondary | ICD-10-CM | POA: Diagnosis not present

## 2023-12-30 DIAGNOSIS — I5020 Unspecified systolic (congestive) heart failure: Secondary | ICD-10-CM | POA: Diagnosis not present

## 2023-12-30 DIAGNOSIS — E785 Hyperlipidemia, unspecified: Secondary | ICD-10-CM | POA: Diagnosis not present

## 2023-12-30 DIAGNOSIS — Z0000 Encounter for general adult medical examination without abnormal findings: Secondary | ICD-10-CM | POA: Diagnosis not present

## 2024-01-06 ENCOUNTER — Encounter (HOSPITAL_BASED_OUTPATIENT_CLINIC_OR_DEPARTMENT_OTHER): Payer: Self-pay | Admitting: Orthopaedic Surgery

## 2024-01-06 ENCOUNTER — Telehealth: Payer: Self-pay | Admitting: Orthopaedic Surgery

## 2024-01-06 ENCOUNTER — Other Ambulatory Visit: Payer: Self-pay

## 2024-01-06 ENCOUNTER — Encounter: Payer: Self-pay | Admitting: Cardiology

## 2024-01-06 NOTE — Telephone Encounter (Signed)
 Patient would like to know how soon will she be weight bearing, able to drive, etc. Please call to advise.

## 2024-01-06 NOTE — Progress Notes (Signed)
   01/06/24 1111  Pre-op Phone Call  Surgery Date Verified 01/13/24  Arrival Time Verified 1100  Surgery Location Verified [facility] [place]  Medical History Reviewed Yes  Is the patient taking a GLP-1 receptor agonist? No  Does the patient have diabetes? No diagnosis of diabetes  Do you have a history of heart problems? Yes  Cardiologist Name Dr. Jeffrie  Have you ever had tests on your heart? Yes  What cardiac tests were performed? Echo;Cardiac Cath;Stress Test;Labs  Results viewable: [facility] Media Tab  Does patient have other implanted devices? No  Patient Teaching Enhanced Recovery;Pre / Post Procedure  Patient educated about smoking cessation 24 hours prior to surgery. N/A Non-Smoker  Patient verbalizes understanding of bowel prep? N/A  THA/TKA patients only:  By your surgery date, will you have been taking narcotics for 90 days or greater? No  Med Rec Completed Yes  Recent  Lab Work, EKG, CXR? No  NPO (Including gum & candy) After midnight  Allowed clear liquids Water ;Gatorade  (diabetics please choose diet or no sugar options)  Patient instructed to stop clear liquids including Carb loading drink at: 0900  Stop Solids, Milk, Candy, and Gum STARTING AT MIDNIGHT  Did patient view EMMI videos? No  Responsible adult to drive and be with you for 24 hours? Yes  Name & Phone Number for Ride/Caregiver marty  No Jewelry, money, nail polish or make-up.  No lotions, powders, perfumes. No shaving  48 hrs. prior to surgery. Yes  Contacts, Dentures & Glasses Will Have to be Removed Before OR. Yes  Please bring your ID and Insurance Card the morning of your surgery. (Surgery Centers Only) Yes  Bring any papers or x-rays with you that your surgeon gave you. Yes  Instructed to contact the location of procedure/ provider if they or anyone in their household develops symptoms or tests positive for COVID-19, has close contact with someone who tests positive for COVID, or has known exposure to any contagious  illness. Yes  Call this number the morning of surgery  with any problems that may cancel your surgery. [phone]  Covid-19 Assessment  Have you had a positive COVID-19 test within the previous 90 days? No  COVID Testing Guidance Proceed with the additional questions.  Have you been unmasked and in close contact with anyone with COVID-19 or COVID-19 symptoms within the past 10 days? No  Do you or anyone in your household currently have any COVID-19 symptoms? No   Spoke with Dr. Treen and okay for [facility]

## 2024-01-06 NOTE — Telephone Encounter (Signed)
 Called and spoke to pateint informing her she likely will have to wait a few weeeks before driving as there is no clearance to drive. I explained that patient will be WBAT after surgery with a walker. I also confirmed she was set up with physical therapy post op to go over restrictions, walking etc. Post op

## 2024-01-07 DIAGNOSIS — F4321 Adjustment disorder with depressed mood: Secondary | ICD-10-CM | POA: Diagnosis not present

## 2024-01-09 ENCOUNTER — Encounter (HOSPITAL_BASED_OUTPATIENT_CLINIC_OR_DEPARTMENT_OTHER)
Admission: RE | Admit: 2024-01-09 | Discharge: 2024-01-09 | Disposition: A | Source: Ambulatory Visit | Attending: Orthopaedic Surgery

## 2024-01-09 DIAGNOSIS — Z01818 Encounter for other preprocedural examination: Secondary | ICD-10-CM | POA: Diagnosis not present

## 2024-01-09 LAB — SURGICAL PCR SCREEN
MRSA, PCR: POSITIVE — AB
Staphylococcus aureus: POSITIVE — AB

## 2024-01-09 NOTE — Progress Notes (Signed)

## 2024-01-10 NOTE — Progress Notes (Signed)
 Critical lab result CRITICAL VALUE STICKER  CRITICAL VALUE: positive MRSA PCR  RECEIVER (on-site recipient of call):Mayumi Summerson Julieanne, RN  DATE & TIME NOTIFIED: 01/09/2024   1625  MESSENGER (representative from lab):Gaylan Hick  MD NOTIFIED: 01/09/24    TIME OF NOTIFICATION:1635  RESPONSE: Orders for MRSA protocol

## 2024-01-12 ENCOUNTER — Encounter (HOSPITAL_BASED_OUTPATIENT_CLINIC_OR_DEPARTMENT_OTHER): Payer: Self-pay | Admitting: Physical Therapy

## 2024-01-13 ENCOUNTER — Encounter (HOSPITAL_BASED_OUTPATIENT_CLINIC_OR_DEPARTMENT_OTHER): Payer: Self-pay | Admitting: Orthopaedic Surgery

## 2024-01-13 ENCOUNTER — Ambulatory Visit (HOSPITAL_BASED_OUTPATIENT_CLINIC_OR_DEPARTMENT_OTHER): Payer: Self-pay | Admitting: Anesthesiology

## 2024-01-13 ENCOUNTER — Encounter (HOSPITAL_BASED_OUTPATIENT_CLINIC_OR_DEPARTMENT_OTHER): Admission: RE | Disposition: A | Payer: Self-pay | Source: Home / Self Care | Attending: Orthopaedic Surgery

## 2024-01-13 ENCOUNTER — Ambulatory Visit (HOSPITAL_BASED_OUTPATIENT_CLINIC_OR_DEPARTMENT_OTHER)
Admission: RE | Admit: 2024-01-13 | Discharge: 2024-01-13 | Disposition: A | Attending: Orthopaedic Surgery | Admitting: Orthopaedic Surgery

## 2024-01-13 ENCOUNTER — Other Ambulatory Visit: Payer: Self-pay

## 2024-01-13 ENCOUNTER — Encounter (HOSPITAL_BASED_OUTPATIENT_CLINIC_OR_DEPARTMENT_OTHER): Payer: Self-pay | Admitting: Anesthesiology

## 2024-01-13 DIAGNOSIS — I110 Hypertensive heart disease with heart failure: Secondary | ICD-10-CM | POA: Diagnosis not present

## 2024-01-13 DIAGNOSIS — S76012A Strain of muscle, fascia and tendon of left hip, initial encounter: Secondary | ICD-10-CM | POA: Diagnosis not present

## 2024-01-13 DIAGNOSIS — W19XXXA Unspecified fall, initial encounter: Secondary | ICD-10-CM | POA: Diagnosis not present

## 2024-01-13 DIAGNOSIS — I509 Heart failure, unspecified: Secondary | ICD-10-CM | POA: Diagnosis not present

## 2024-01-13 DIAGNOSIS — I340 Nonrheumatic mitral (valve) insufficiency: Secondary | ICD-10-CM | POA: Diagnosis not present

## 2024-01-13 DIAGNOSIS — F419 Anxiety disorder, unspecified: Secondary | ICD-10-CM | POA: Diagnosis not present

## 2024-01-13 DIAGNOSIS — S76019A Strain of muscle, fascia and tendon of unspecified hip, initial encounter: Secondary | ICD-10-CM | POA: Diagnosis not present

## 2024-01-13 DIAGNOSIS — I2510 Atherosclerotic heart disease of native coronary artery without angina pectoris: Secondary | ICD-10-CM | POA: Diagnosis not present

## 2024-01-13 DIAGNOSIS — F32A Depression, unspecified: Secondary | ICD-10-CM | POA: Diagnosis not present

## 2024-01-13 DIAGNOSIS — Z01818 Encounter for other preprocedural examination: Secondary | ICD-10-CM

## 2024-01-13 DIAGNOSIS — Z955 Presence of coronary angioplasty implant and graft: Secondary | ICD-10-CM | POA: Diagnosis not present

## 2024-01-13 HISTORY — PX: OPEN SURGICAL REPAIR OF GLUTEAL TENDON: SHX5995

## 2024-01-13 SURGERY — REPAIR, TENDON, GLUTEUS MEDIUS, OPEN
Anesthesia: General | Site: Hip | Laterality: Left

## 2024-01-13 MED ORDER — LIDOCAINE 2% (20 MG/ML) 5 ML SYRINGE
INTRAMUSCULAR | Status: DC | PRN
  Administered 2024-01-13: 60 mg via INTRAVENOUS

## 2024-01-13 MED ORDER — TRANEXAMIC ACID-NACL 1000-0.7 MG/100ML-% IV SOLN
INTRAVENOUS | Status: AC
  Filled 2024-01-13: qty 100

## 2024-01-13 MED ORDER — FENTANYL CITRATE (PF) 100 MCG/2ML IJ SOLN
INTRAMUSCULAR | Status: DC | PRN
  Administered 2024-01-13: 100 ug via INTRAVENOUS

## 2024-01-13 MED ORDER — MIDAZOLAM HCL 5 MG/5ML IJ SOLN
INTRAMUSCULAR | Status: DC | PRN
  Administered 2024-01-13: 2 mg via INTRAVENOUS

## 2024-01-13 MED ORDER — LACTATED RINGERS IV SOLN: INTRAVENOUS | Status: DC | PRN

## 2024-01-13 MED ORDER — KETOROLAC TROMETHAMINE 30 MG/ML IJ SOLN: 30.0000 mg | Freq: Once | INTRAMUSCULAR | Status: DC | PRN

## 2024-01-13 MED ORDER — 0.9 % SODIUM CHLORIDE (POUR BTL) OPTIME
TOPICAL | Status: DC | PRN
  Administered 2024-01-13: 1000 mL

## 2024-01-13 MED ORDER — PROPOFOL 10 MG/ML IV BOLUS
INTRAVENOUS | Status: DC | PRN
  Administered 2024-01-13: 150 ug via INTRAVENOUS

## 2024-01-13 MED ORDER — FENTANYL CITRATE (PF) 100 MCG/2ML IJ SOLN
INTRAMUSCULAR | Status: AC
  Filled 2024-01-13: qty 2

## 2024-01-13 MED ORDER — BUPIVACAINE HCL 0.25 % IJ SOLN
INTRAMUSCULAR | Status: DC | PRN
  Administered 2024-01-13: 20 mL

## 2024-01-13 MED ORDER — ONDANSETRON HCL 4 MG/2ML IJ SOLN
INTRAMUSCULAR | Status: DC | PRN
  Administered 2024-01-13: 4 mg via INTRAVENOUS

## 2024-01-13 MED ORDER — CEFAZOLIN SODIUM-DEXTROSE 2-4 GM/100ML-% IV SOLN
INTRAVENOUS | Status: AC
  Filled 2024-01-13: qty 100

## 2024-01-13 MED ORDER — DEXAMETHASONE SOD PHOSPHATE PF 10 MG/ML IJ SOLN
INTRAMUSCULAR | Status: DC | PRN
  Administered 2024-01-13: 8 mg via INTRAVENOUS

## 2024-01-13 MED ORDER — OXYCODONE HCL 5 MG PO TABS
ORAL_TABLET | ORAL | Status: AC
  Filled 2024-01-13: qty 1

## 2024-01-13 MED ORDER — TRANEXAMIC ACID-NACL 1000-0.7 MG/100ML-% IV SOLN: 1000.0000 mg | INTRAVENOUS | Status: DC

## 2024-01-13 MED ORDER — EPHEDRINE SULFATE-NACL 50-0.9 MG/10ML-% IV SOSY
PREFILLED_SYRINGE | INTRAVENOUS | Status: DC | PRN
  Administered 2024-01-13: 10 mg via INTRAVENOUS

## 2024-01-13 MED ORDER — CEFAZOLIN SODIUM-DEXTROSE 2-4 GM/100ML-% IV SOLN
2.0000 g | INTRAVENOUS | Status: AC
  Administered 2024-01-13: 2 g via INTRAVENOUS

## 2024-01-13 MED ORDER — GLYCOPYRROLATE PF 0.2 MG/ML IJ SOSY
PREFILLED_SYRINGE | INTRAMUSCULAR | Status: DC | PRN
  Administered 2024-01-13: .2 mg via INTRAVENOUS

## 2024-01-13 MED ORDER — FENTANYL CITRATE (PF) 100 MCG/2ML IJ SOLN
25.0000 ug | INTRAMUSCULAR | Status: DC | PRN
  Administered 2024-01-13 (×3): 50 ug via INTRAVENOUS

## 2024-01-13 MED ORDER — AMISULPRIDE (ANTIEMETIC) 5 MG/2ML IV SOLN: 10.0000 mg | Freq: Once | INTRAVENOUS | Status: DC | PRN

## 2024-01-13 MED ORDER — ACETAMINOPHEN 500 MG PO TABS
ORAL_TABLET | ORAL | Status: AC
  Filled 2024-01-13: qty 2

## 2024-01-13 MED ORDER — OXYCODONE HCL 5 MG PO TABS
5.0000 mg | ORAL_TABLET | Freq: Once | ORAL | Status: AC | PRN
  Administered 2024-01-13: 5 mg via ORAL

## 2024-01-13 MED ORDER — GABAPENTIN 300 MG PO CAPS
ORAL_CAPSULE | ORAL | Status: AC
  Filled 2024-01-13: qty 1

## 2024-01-13 MED ORDER — TRANEXAMIC ACID-NACL 1000-0.7 MG/100ML-% IV SOLN
INTRAVENOUS | Status: DC | PRN
  Administered 2024-01-13: 1000 mg via INTRAVENOUS

## 2024-01-13 MED ORDER — ROCURONIUM 10MG/ML (10ML) SYRINGE FOR MEDFUSION PUMP - OPTIME
INTRAVENOUS | Status: DC | PRN
  Administered 2024-01-13: 40 mg via INTRAVENOUS

## 2024-01-13 MED ORDER — MIDAZOLAM HCL 2 MG/2ML IJ SOLN
INTRAMUSCULAR | Status: AC
  Filled 2024-01-13: qty 2

## 2024-01-13 MED ORDER — EPHEDRINE 5 MG/ML INJ
INTRAVENOUS | Status: AC
  Filled 2024-01-13: qty 5

## 2024-01-13 MED ORDER — ACETAMINOPHEN 500 MG PO TABS
1000.0000 mg | ORAL_TABLET | Freq: Once | ORAL | Status: AC
  Administered 2024-01-13: 1000 mg via ORAL

## 2024-01-13 MED ORDER — ROCURONIUM BROMIDE 10 MG/ML (PF) SYRINGE
PREFILLED_SYRINGE | INTRAVENOUS | Status: AC
  Filled 2024-01-13: qty 10

## 2024-01-13 MED ORDER — LIDOCAINE 2% (20 MG/ML) 5 ML SYRINGE
INTRAMUSCULAR | Status: AC
  Filled 2024-01-13: qty 5

## 2024-01-13 MED ORDER — PROPOFOL 500 MG/50ML IV EMUL
INTRAVENOUS | Status: DC | PRN
  Administered 2024-01-13: 100 ug/kg/min via INTRAVENOUS

## 2024-01-13 MED ORDER — LACTATED RINGERS IV SOLN: INTRAVENOUS | Status: DC

## 2024-01-13 MED ORDER — GABAPENTIN 300 MG PO CAPS
300.0000 mg | ORAL_CAPSULE | Freq: Once | ORAL | Status: AC
  Administered 2024-01-13: 300 mg via ORAL

## 2024-01-13 MED ORDER — SUGAMMADEX SODIUM 200 MG/2ML IV SOLN
INTRAVENOUS | Status: DC | PRN
  Administered 2024-01-13: 200 mg via INTRAVENOUS

## 2024-01-13 MED ORDER — OXYCODONE HCL 5 MG/5ML PO SOLN: 5.0000 mg | Freq: Once | ORAL | Status: AC | PRN

## 2024-01-13 MED ORDER — ONDANSETRON HCL 4 MG/2ML IJ SOLN
INTRAMUSCULAR | Status: AC
  Filled 2024-01-13: qty 2

## 2024-01-13 SURGICAL SUPPLY — 45 items
ANCHOR JUGGERKNOT SOFT 2.9 (Anchor) IMPLANT
ANCHOR SUT QUATTRO KNTLS 4.5 (Anchor) IMPLANT
BLADE SURG 10 STRL SS (BLADE) ×1 IMPLANT
BLADE SURG 15 STRL LF DISP TIS (BLADE) ×1 IMPLANT
CANISTER SUCT 1200ML W/VALVE (MISCELLANEOUS) ×1 IMPLANT
CHLORAPREP W/TINT 26 (MISCELLANEOUS) ×1 IMPLANT
CLSR STERI-STRIP ANTIMIC 1/2X4 (GAUZE/BANDAGES/DRESSINGS) IMPLANT
COOLER ICEMAN CLASSIC (MISCELLANEOUS) ×1 IMPLANT
COVER BACK TABLE 60X90IN (DRAPES) ×1 IMPLANT
COVER MAYO STAND STRL (DRAPES) ×1 IMPLANT
DERMABOND ADVANCED .7 DNX12 (GAUZE/BANDAGES/DRESSINGS) IMPLANT
DRAPE STERI IOBAN 125X83 (DRAPES) ×1 IMPLANT
DRAPE U-SHAPE 47X51 STRL (DRAPES) ×2 IMPLANT
DRSG AQUACEL AG ADV 3.5X 6 (GAUZE/BANDAGES/DRESSINGS) ×1 IMPLANT
DRSG AQUACEL AG ADV 3.5X10 (GAUZE/BANDAGES/DRESSINGS) IMPLANT
ELECTRODE BLDE 4.0 EZ CLN MEGD (MISCELLANEOUS) IMPLANT
ELECTRODE REM PT RTRN 9FT ADLT (ELECTROSURGICAL) ×1 IMPLANT
GAUZE PAD ABD 8X10 STRL (GAUZE/BANDAGES/DRESSINGS) IMPLANT
GAUZE XEROFORM 1X8 LF (GAUZE/BANDAGES/DRESSINGS) IMPLANT
GLOVE BIO SURGEON STRL SZ 6 (GLOVE) ×2 IMPLANT
GLOVE BIO SURGEON STRL SZ7.5 (GLOVE) ×2 IMPLANT
GLOVE BIOGEL PI IND STRL 6.5 (GLOVE) ×1 IMPLANT
GLOVE BIOGEL PI IND STRL 8 (GLOVE) ×1 IMPLANT
GOWN STRL REUS W/ TWL LRG LVL3 (GOWN DISPOSABLE) ×2 IMPLANT
GOWN STRL REUS W/TWL XL LVL3 (GOWN DISPOSABLE) ×1 IMPLANT
MANIFOLD NEPTUNE II (INSTRUMENTS) ×1 IMPLANT
NDL HYPO 22X1.5 SAFETY MO (MISCELLANEOUS) ×1 IMPLANT
PACK BASIN DAY SURGERY FS (CUSTOM PROCEDURE TRAY) ×1 IMPLANT
PAD COLD SHLDR WRAP-ON (PAD) ×1 IMPLANT
PENCIL SMOKE EVACUATOR (MISCELLANEOUS) ×1 IMPLANT
SLEEVE SCD COMPRESS KNEE MED (STOCKING) ×1 IMPLANT
SOLN 0.9% NACL POUR BTL 1000ML (IV SOLUTION) ×1 IMPLANT
SPIKE FLUID TRANSFER (MISCELLANEOUS) IMPLANT
SPONGE T-LAP 18X18 ~~LOC~~+RFID (SPONGE) ×1 IMPLANT
SUCTION TUBE FRAZIER 10FR DISP (SUCTIONS) ×1 IMPLANT
SUT ETHILON 3 0 PS 1 (SUTURE) IMPLANT
SUT MNCRL AB 3-0 PS2 27 (SUTURE) IMPLANT
SUT VIC AB 0 CT1 27XBRD ANBCTR (SUTURE) ×1 IMPLANT
SUT VIC AB 2-0 CT1 TAPERPNT 27 (SUTURE) ×1 IMPLANT
SYR 20ML LL LF (SYRINGE) ×1 IMPLANT
SYR BULB EAR ULCER 3OZ GRN STR (SYRINGE) ×1 IMPLANT
TOWEL GREEN STERILE FF (TOWEL DISPOSABLE) ×2 IMPLANT
TUBE CONNECTING 20X1/4 (TUBING) ×1 IMPLANT
UNDERPAD 30X36 HEAVY ABSORB (UNDERPADS AND DIAPERS) IMPLANT
YANKAUER SUCT BULB TIP NO VENT (SUCTIONS) ×1 IMPLANT

## 2024-01-13 NOTE — Interval H&P Note (Signed)
 History and Physical Interval Note:  01/13/2024 1:39 PM  Traci Roy  has presented today for surgery, with the diagnosis of LEFT GLUTEUS MEDIUS INSUFFICIENCY.  The various methods of treatment have been discussed with the patient and family. After consideration of risks, benefits and other options for treatment, the patient has consented to  Procedure(s) with comments: REPAIR, TENDON, GLUTEUS MEDIUS, OPEN (Left) - LEFT GLUTEUS MAXIMUS TENDON TRANSFER as a surgical intervention.  The patient's history has been reviewed, patient examined, no change in status, stable for surgery.  I have reviewed the patient's chart and labs.  Questions were answered to the patient's satisfaction.     Arlinda Barcelona

## 2024-01-13 NOTE — Discharge Instructions (Addendum)
 Discharge Instructions    Attending Surgeon: Elspeth Parker, MD Office Phone Number: [phone]   Diagnosis and Procedures:    Surgeries Performed: Left hip gluteus maximus tendon transfer  Discharge Plan:    Diet: Resume usual diet. Begin with light or bland foods.  Drink plenty of fluids.  Activity:  Weight bearing as tolerated left leg. You are advised to go home directly from the hospital or surgical center. Restrict your activities.  GENERAL INSTRUCTIONS: 1.  Please apply ice to your wound to help with swelling and inflammation. This will improve your comfort and your overall recovery following surgery.     2. Please call Dr. Danetta office at [phone] with questions Monday-Friday during business hours. If no one answers, please leave a message and someone should get back to the patient within 24 hours. For emergencies please call 911 or proceed to the emergency room.   3. Patient to notify surgical team if experiences any of the following: Bowel/Bladder dysfunction, uncontrolled pain, nerve/muscle weakness, incision with increased drainage or redness, nausea/vomiting and Fever greater than 101.0 F.  Be alert for signs of infection including redness, streaking, odor, fever or chills. Be alert for excessive pain or bleeding and notify your surgeon immediately.  WOUND INSTRUCTIONS:   Leave your dressing, cast, or splint in place until your post operative visit.  Keep it clean and dry.  Always keep the incision clean and dry until the staples/sutures are removed. If there is no drainage from the incision you should keep it open to air. If there is drainage from the incision you must keep it covered at all times until the drainage stops  Do not soak in a bath tub, hot tub, pool, lake or other body of water  until 21 days after your surgery and your incision is completely dry and healed.  If you have removable sutures (or staples) they must be removed 10-14 days  (unless otherwise instructed) from the day of your surgery.     1)  Elevate the extremity as much as possible.  2)  Keep the dressing clean and dry.  3)  Please call us  if the dressing becomes wet or dirty.  4)  If you are experiencing worsening pain or worsening swelling, please call.     MEDICATIONS: Resume all previous home medications at the previous prescribed dose and frequency unless otherwise noted Start taking the  pain medications on an as-needed basis as prescribed  Please taper down pain medication over the next week following surgery.  Ideally you should not require a refill of any narcotic pain medication.  Take pain medication with food to minimize nausea. In addition to the prescribed pain medication, you may take over-the-counter pain relievers such as Tylenol .  Do NOT take additional tylenol  if your pain medication already has tylenol  in it.  Aspirin  325mg  daily per instructions on bottle. Narcotic policy: Per [facility] clinic policy, our goal is ensure optimal postoperative pain control with a multimodal pain management strategy. For all [facility] patients, our goal is to wean post-operative narcotic medications by 6 weeks post-operatively, and many times sooner. If this is not possible due to utilization of pain medication prior to surgery, your [facility] doctor will support your acute post-operative pain control for the first 6 weeks postoperatively, with a plan to transition you back to your primary pain team following that. Maralee will work to ensure a therapist, occupational.       FOLLOWUP INSTRUCTIONS: 1. Follow up at the  Physical Therapy Clinic 3-4 days following surgery. This appointment should be scheduled unless other arrangements have been made.The Physical Therapy scheduling number is [phone] if an appointment has not already been arranged.  2. Contact Dr. Danetta office during office hours at [phone] or the practice after hours line at [phone] for  non-emergencies. For medical emergencies call 911.   Discharge Location: Home   *No Tylenol  until after 5:40 pm   Post Anesthesia Home Care Instructions  Activity: Get plenty of rest for the remainder of the day. A responsible individual must stay with you for 24 hours following the procedure.  For the next 24 hours, DO NOT: -Drive a car -Advertising copywriter -Drink alcoholic beverages -Take any medication unless instructed by your physician -Make any legal decisions or sign important papers.  Meals: Start with liquid foods such as gelatin or soup. Progress to regular foods as tolerated. Avoid greasy, spicy, heavy foods. If nausea and/or vomiting occur, drink only clear liquids until the nausea and/or vomiting subsides. Call your physician if vomiting continues.  Special Instructions/Symptoms: Your throat may feel dry or sore from the anesthesia or the breathing tube placed in your throat during surgery. If this causes discomfort, gargle with warm salt water . The discomfort should disappear within 24 hours.  If you had a scopolamine patch placed behind your ear for the management of post- operative nausea and/or vomiting:  1. The medication in the patch is effective for 72 hours, after which it should be removed.  Wrap patch in a tissue and discard in the trash. Wash hands thoroughly with soap and water . 2. You may remove the patch earlier than 72 hours if you experience unpleasant side effects which may include dry mouth, dizziness or visual disturbances. 3. Avoid touching the patch. Wash your hands with soap and water  after contact with the patch.

## 2024-01-13 NOTE — Anesthesia Preprocedure Evaluation (Addendum)
 Anesthesia Evaluation  Patient identified by MRN, date of birth, ID band Patient awake    Reviewed: Allergy & Precautions, NPO status , Patient's Chart, lab work & pertinent test results  Airway Mallampati: II  TM Distance: >3 FB Neck ROM: Full    Dental no notable dental hx.    Pulmonary neg pulmonary ROS   Pulmonary exam normal        Cardiovascular hypertension, Pt. on medications + CAD, + Cardiac Stents and +CHF  Normal cardiovascular exam  ECHO: 1. Left ventricular ejection fraction, by estimation, is 50 to 55%. Left  ventricular ejection fraction by 3D volume is 54 %. The left ventricle has  low normal function. The left ventricle has no regional wall motion  abnormalities. Left ventricular  diastolic parameters were normal.   2. Right ventricular systolic function is normal. The right ventricular  size is normal. There is normal pulmonary artery systolic pressure. The  estimated right ventricular systolic pressure is 19.8 mmHg.   3. The mitral valve is normal in structure. Mild mitral valve  regurgitation. No evidence of mitral stenosis.   4. The aortic valve is tricuspid. Aortic valve regurgitation is not  visualized. No aortic stenosis is present.   5. The inferior vena cava is normal in size with greater than 50%  respiratory variability, suggesting right atrial pressure of 3 mmHg.      Neuro/Psych  PSYCHIATRIC DISORDERS Anxiety Depression    negative neurological ROS     GI/Hepatic negative GI ROS, Neg liver ROS,  Medicated and Controlled,,  Endo/Other  negative endocrine ROS    Renal/GU Renal disease     Musculoskeletal   Abdominal  (+) + obese  Peds  Hematology negative hematology ROS (+)   Anesthesia Other Findings LEFT GLUTEUS MEDIUS INSUFFICIENCY  Reproductive/Obstetrics                              Anesthesia Physical Anesthesia Plan  ASA: 3  Anesthesia Plan:  General   Post-op Pain Management:    Induction: Intravenous  PONV Risk Score and Plan: 3 and Ondansetron , Dexamethasone , Midazolam  and Treatment may vary due to age or medical condition  Airway Management Planned: Oral ETT  Additional Equipment:   Intra-op Plan:   Post-operative Plan: Extubation in OR  Informed Consent: I have reviewed the patients History and Physical, chart, labs and discussed the procedure including the risks, benefits and alternatives for the proposed anesthesia with the patient or authorized representative who has indicated his/her understanding and acceptance.     Dental advisory given  Plan Discussed with: CRNA  Anesthesia Plan Comments:          Anesthesia Quick Evaluation

## 2024-01-13 NOTE — Op Note (Signed)
 Date of Surgery: 01/13/2024  INDICATIONS: Traci Roy is a 63 y.o.-year-old female with left hip gluteus medius chronic tearing.  The risk and benefits of the procedure were discussed in detail and documented in the pre-operative evaluation.   PREOPERATIVE DIAGNOSIS: 1. Left hip chronic gluteus medius tear  POSTOPERATIVE DIAGNOSIS: Same.  PROCEDURE: 1. Left hip gluteus maximus tendon transfer 2. Left hip open trochanteric bursectomy  SURGEON: Elspeth LITTIE Parker MD  ASSISTANT: Conley Dawson, ATC  ANESTHESIA:  general  IV FLUIDS AND URINE: See anesthesia record.  ANTIBIOTICS: Ancef   ESTIMATED BLOOD LOSS: 25 mL.  IMPLANTS:  Implant Name Type Inv. Item Serial No. Manufacturer Lot No. LRB No. Used Action  ANCHOR SUT QUATTRO KNTLS 4.5 - [id number] Anchor ANCHOR SUT QUATTRO KNTLS 4.5  [employer](ORTH,TRAU,BIO,SG) [id number] Left 1 Implanted  ANCHOR SUT QUATTRO KNTLS 4.5 - [id number] Anchor ANCHOR SUT QUATTRO KNTLS 4.5  [employer](ORTH,TRAU,BIO,SG) [id number] Left 1 Implanted  ANCHOR JUGGERKNOT SOFT 2.9 - [id number] Anchor ANCHOR JUGGERKNOT SOFT 2.9  [employer](ORTH,TRAU,BIO,SG) [id number] Left 1 Implanted  ANCHOR JUGGERKNOT SOFT 2.9 - [id number] Anchor ANCHOR JUGGERKNOT SOFT 2.9  [employer](ORTH,TRAU,BIO,SG) [id number] Left 1 Implanted    DRAINS: None  CULTURES: None  COMPLICATIONS: none  DESCRIPTION OF PROCEDURE:  The patient was identified in the preoperative holding area.  Antibiotics were given 1 hour prior to skin incision.  Timeout was performed and surgical site was marked with nursing.  The patient was subsequently taken back to the operating room.  Anesthesia was induced. The patient was placed in the lateral decubitus position.  Axillary roll and down peroneal nerve was padded well   At this time I began with an approach to the lateral trochanter centered at the vastus ridge.  15 blade was used to incise skin.  This was done down to the level of the IT band.   Electrocautery was utilized to achieve hemostasis.  At this time the IT band was marked in such a way as to preserve an anterior flap.  This was incised completely with a of an abduction.  Gluteus medius was completely torn and found to be nonviable with significant fatty infiltration.  At this time a flap was mobilized anteriorly from the gluteus maximus in a triangular fashion.  This mobilized well over to the trochanter. The trochanteric bursectomy was seen and excises with Metzenbaums.   A double row construct was utilized with 2 medial row all suture anchors.  These were brought up through the remaining gluteus medius tendon and into the flap.  2 of these were tied to provisionally stabilize the flap.  These were then brought up to the distal most aspect of the flap and brought into 2 lateral row anchors over the native footprint of the gluteus medius.  Given the quality of the tendon the decision was made to supplement with a collagen patch.  This was sutured in the corners into the native insertion of the gluteus medius.   This time the wound was thoroughly irrigated.  The posterior aspect of the IT band was sutured to the posterior limb of the flap.  This was done with 0 Vicryl.  This was done in an interrupted fashion.  The wound was again irrigated and closed in layers of 0 Vicryl, 2-0 Vicryl and 3-0 nylon.  Xeroform, gauze, Aquacel dressing were applied.   Instrument, sponge, and needle counts were correct prior to wound closure and at the conclusion of the case.  The patient was taken to the PACU without  complication         POSTOPERATIVE PLAN: The patient will be 50% weightbearing for total of 2 weeks.  She will be placed on Aspirin  for blood clot prevention.  I will see the patient back in 2 weeks.   Elspeth LITTIE Parker, MD 3:13 PM

## 2024-01-13 NOTE — Brief Op Note (Signed)
   Brief Op Note  Date of Surgery: 01/13/2024  Preoperative Diagnosis: LEFT GLUTEUS MEDIUS INSUFFICIENCY  Postoperative Diagnosis: same  Procedure: Procedure(s): REPAIR, TENDON, GLUTEUS MEDIUS, OPEN  Implants: Implant Name Type Inv. Item Serial No. Manufacturer Lot No. LRB No. Used Action  ANCHOR SUT QUATTRO KNTLS 4.5 - [id number] Anchor ANCHOR SUT QUATTRO KNTLS 4.5  [employer](ORTH,TRAU,BIO,SG) [id number] Left 1 Implanted  ANCHOR SUT QUATTRO KNTLS 4.5 - [id number] Anchor ANCHOR SUT QUATTRO KNTLS 4.5  [employer](ORTH,TRAU,BIO,SG) [id number] Left 1 Implanted  ANCHOR JUGGERKNOT SOFT 2.9 - [id number] Anchor ANCHOR JUGGERKNOT SOFT 2.9  [employer](ORTH,TRAU,BIO,SG) [id number] Left 1 Implanted  ANCHOR JUGGERKNOT SOFT 2.9 - [id number] Anchor ANCHOR JUGGERKNOT SOFT 2.9  [employer](ORTH,TRAU,BIO,SG) [id number] Left 1 Implanted    Surgeons: Surgeon(s): Genelle Standing, MD  Anesthesia: General    Estimated Blood Loss: See anesthesia record  Complications: None  Condition to PACU: Stable  Standing LITTIE Genelle, MD 01/13/2024 3:13 PM

## 2024-01-13 NOTE — H&P (Signed)
 Expand All Collapse All       Chief Complaint: Left hip pain        History of Present Illness:      Traci Roy is a 63 y.o. female presents today with lateral based hip pain which has been ongoing for the last several years.  Did have an injury in this late spring where she fell directly onto the side.  She was being seen by Dr. Daldorf who obtained an MRI.  She has not had an injection which only gave her 2 hours of relief.  She has been participating in physical therapy without significant relief.  She does enjoy woodworking for fun.  She does note a mildly antalgic and painful gait.  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   CHF (congestive heart failure) (HCC)     Depression      takes Lexapro  daily   Dizziness      occasionally and no meds   Flu      end of Dec 2014   GERD (gastroesophageal reflux disease)      occasionally and no meds required   History of bronchitis      last time many yrs ago   History of duodenal ulcer     History of shingles 3-5 YRS AGO   Hypertension      takes Losartan  and HCTZ daily   Joint pain     Restless legs               Past Surgical History:  Procedure Laterality Date   BREAST ENHANCEMENT SURGERY Bilateral     CESAREAN SECTION        X 1   COLONOSCOPY       CORONARY PRESSURE/FFR STUDY N/A 12/18/2019    Procedure: INTRAVASCULAR PRESSURE WIRE/FFR STUDY;  Surgeon: Dann Candyce RAMAN, MD;  Location: [facility] INVASIVE CV LAB;  Service: Cardiovascular;  Laterality: N/A;   CORONARY STENT INTERVENTION N/A 12/18/2019    Procedure: CORONARY STENT INTERVENTION;  Surgeon: Dann Candyce RAMAN, MD;  Location: [facility] INVASIVE CV LAB;  Service: Cardiovascular;  Laterality: N/A;   enlarged bladder as a child        HAD SURGERY TO ENLARGE BLADDER    LAPAROSCOPIC GASTRIC SLEEVE RESECTION N/A 10/24/2021    Procedure: LAPAROSCOPIC SLEEVE GASTRECTOMY WITH HIATAL HERNIA REPAIR;  Surgeon: Signe Mitzie LABOR, MD;  Location: [facility] ORS;  Service: General;  Laterality: N/A;   RIGHT/LEFT HEART CATH AND CORONARY ANGIOGRAPHY N/A 12/18/2019    Procedure: RIGHT/LEFT HEART CATH AND CORONARY ANGIOGRAPHY;  Surgeon: Dann Candyce RAMAN, MD;  Location: [facility] INVASIVE CV LAB;  Service: Cardiovascular;  Laterality: N/A;   TONSILLECTOMY       TOTAL HIP ARTHROPLASTY Right 04/14/2013    Procedure: TOTAL HIP ARTHROPLASTY ANTERIOR APPROACH;  Surgeon: Maude KANDICE Herald, MD;  Location: [facility] OR;  Service: Orthopedics;  Laterality: Right;   UPPER GI ENDOSCOPY N/A 10/24/2021    Procedure: UPPER GI ENDOSCOPY;  Surgeon: Signe Mitzie LABOR, MD;  Location: [facility] ORS;  Service: General;  Laterality: N/A;        Social History         Socioeconomic History   Marital status: Married  Spouse name: Not on file   Number of children: Not on file   Years of education: Not on file   Highest education level: Not on file  Occupational History   Not on file  Tobacco Use   Smoking status: Never   Smokeless tobacco: Never  Vaping Use   Vaping status: Never Used  Substance and Sexual Activity   Alcohol use: Yes      Comment: glass of wine every now and then   Drug use: No   Sexual activity: Yes      Birth control/protection: Other-see comments  Other Topics Concern   Not on file  Social History Narrative   Not on file    Social Drivers of Health        Financial Resource Strain: Not on file  Food Insecurity: No Food Insecurity (10/24/2021)    Hunger Vital Sign     Worried About Running Out of Food in the Last Year: Never true     Ran Out of Food in the Last Year: Never true  Transportation Needs: No Transportation Needs (10/24/2021)    PRAPARE - Therapist, Art (Medical): No     Lack of Transportation (Non-Medical): No  Physical Activity: Not on  file  Stress: Not on file  Social Connections: Unknown (06/20/2021)    Received from [facility]    Social Network     Social Network: Not on file    Family History  Family history unknown: Yes        Allergies       Allergies  Allergen Reactions   Lisinopril Cough   Adhesive [Tape] Itching and Rash      Itching and breaks out   Silicone Itching and Rash      Itching and breaks out            Current Outpatient Medications  Medication Sig Dispense Refill   acetaminophen  (TYLENOL ) 500 MG tablet Take 1 tablet (500 mg total) by mouth every 8 (eight) hours for 10 days. 30 tablet 0   aspirin  EC 325 MG tablet Take 1 tablet (325 mg total) by mouth daily. 14 tablet 0   ibuprofen  (ADVIL ) 800 MG tablet Take 1 tablet (800 mg total) by mouth every 8 (eight) hours for 10 days. Please take with food, please alternate with acetaminophen  30 tablet 0   oxyCODONE  (ROXICODONE ) 5 MG immediate release tablet Take 1 tablet (5 mg total) by mouth every 4 (four) hours as needed for severe pain (pain score 7-10) or breakthrough pain. 30 tablet 0   acetaminophen  (TYLENOL ) 500 MG tablet Take 500 mg by mouth every 6 (six) hours as needed for moderate pain or mild pain.       aspirin  EC 81 MG tablet Take 1 tablet (81 mg total) by mouth daily. Swallow whole. 30 tablet 12   escitalopram  (LEXAPRO ) 20 MG tablet Take 20 mg by mouth daily.       gabapentin  (NEURONTIN ) 100 MG capsule Take 2 capsules (200 mg total) by mouth every 12 (twelve) hours. 20 capsule 0   magnesium  oxide (MAG-OX) 400 (240 Mg) MG tablet Take 500 mg by mouth daily.       Melatonin 5 MG CAPS Take 5 mg by mouth at bedtime.       nitroGLYCERIN  (NITROSTAT ) 0.4 MG SL tablet PLACE 1 TABLET (0.4 MG TOTAL) UNDER THE TONGUE EVERY FIVE MINUTES AS NEEDED FOR CHEST PAIN. 25 tablet 0  sacubitril -valsartan  (ENTRESTO ) 24-26 MG Take 1 tablet by mouth 2 (two) times daily. 180 tablet 1      No current facility-administered medications for this visit.       Imaging Results (Last 48 hours)  No results found.     Review of Systems:   A ROS was performed including pertinent positives and negatives as documented in the HPI.   Physical Exam :   Constitutional: NAD and appears stated age Neurological: Alert and oriented Psych: Appropriate affect and cooperative Last menstrual period 02/05/2014.    Comprehensive Musculoskeletal Exam:     Left hip with tenderness about the greater trochanter and pain with resisted abduction, negative Trendelenburg gait.  30 degrees internal/external rotation of the left hip are without pain distal neurosensory exams intact     Imaging:   Xray (4 views left hip): Normal   MRI (left left hip): Full-thickness tear of the gluteus medius minimus junction     I personally reviewed and interpreted the radiographs.     Assessment and Plan:   63 y.o. female with a full-thickness gluteus medius minimus tear of the left hip.  At this time she has trialed both an injection as well as physical therapy.  I did discuss that unfortunately her T1 view does show significant atrophy in the glue to this effect in addition to a repair I would recommend the gluteus maximus tendon transfer to aid in recovery and function.  I discussed the risks and limitations as well as associated recovery timeframe.  After discussion she would like to proceed   -Plan for left hip gluteus medius repair with gluteus maximus tendon transfer     After a lengthy discussion of treatment options, including risks, benefits, alternatives, complications of surgical and nonsurgical conservative options, the patient elected surgical repair.    The patient  is aware of the material risks  and complications including, but not limited to injury to adjacent structures, neurovascular injury, infection, numbness, bleeding, implant failure, thermal burns, stiffness, persistent pain, failure to heal, disease transmission from allograft, need for further surgery,  dislocation, anesthetic risks, blood clots, risks of death,and others. The probabilities of surgical success and failure discussed with patient given their particular co-morbidities.The time and nature of expected rehabilitation and recovery was discussed.The patient's questions were all answered preoperatively.  No barriers to understanding were noted. I explained the natural history of the disease process and Rx rationale.  I explained to the patient what I considered to be reasonable expectations given their personal situation.  The final treatment plan was arrived at through a shared patient decision making process model.       I personally saw and evaluated the patient, and participated in the management and treatment plan.   Elspeth Parker, MD Attending Physician, Orthopedic Surgery   This document was dictated using Dragon voice recognition software. A reasonable attempt at proof reading has been made to minimize errors.

## 2024-01-13 NOTE — Transfer of Care (Signed)
 Immediate Anesthesia Transfer of Care Note  Patient: Traci Roy  Procedure(s) Performed: REPAIR, TENDON, GLUTEUS MEDIUS, OPEN (Left: Hip)  Patient Location: PACU  Anesthesia Type:General  Level of Consciousness: drowsy and patient cooperative  Airway & Oxygen  Therapy: Patient Spontanous Breathing and Patient connected to face mask oxygen   Post-op Assessment: Report given to RN and Post -op Vital signs reviewed and stable  Post vital signs: Reviewed and stable  Last Vitals:  Vitals Value Taken Time  BP 145/84 01/13/24 15:21  Temp    Pulse 77 01/13/24 15:25  Resp 10 01/13/24 15:25  SpO2 99 % 01/13/24 15:25  Vitals shown include unfiled device data.  Last Pain:  Vitals:   01/13/24 1131  TempSrc: Temporal  PainSc: 0-No pain         Complications: No notable events documented.

## 2024-01-13 NOTE — Anesthesia Procedure Notes (Signed)
 Procedure Name: Intubation Date/Time: 01/13/2024 2:14 PM  Performed by: Denton Niels CROME, CRNAPre-anesthesia Checklist: Patient identified, Emergency Drugs available, Suction available and Patient being monitored Patient Re-evaluated:Patient Re-evaluated prior to induction Oxygen  Delivery Method: Circle system utilized Preoxygenation: Pre-oxygenation with 100% oxygen  Induction Type: IV induction Ventilation: Mask ventilation without difficulty Laryngoscope Size: Mac and 3 Grade View: Grade II Tube type: Oral Tube size: 7.0 mm Number of attempts: 1 Airway Equipment and Method: Stylet Placement Confirmation: ETT inserted through vocal cords under direct vision, positive ETCO2 and breath sounds checked- equal and bilateral Secured at: 22 cm Tube secured with: Tape Dental Injury: Teeth and Oropharynx as per pre-operative assessment

## 2024-01-14 ENCOUNTER — Telehealth: Payer: Self-pay

## 2024-01-14 ENCOUNTER — Telehealth: Payer: Self-pay | Admitting: Cardiology

## 2024-01-14 ENCOUNTER — Encounter (HOSPITAL_BASED_OUTPATIENT_CLINIC_OR_DEPARTMENT_OTHER): Payer: Self-pay | Admitting: Orthopaedic Surgery

## 2024-01-14 NOTE — Telephone Encounter (Signed)
 Pt was given Ibuprofen  800 MG in hospital before her surgery. Pt wants to know if she can take it along with sacubitril -valsartan  (ENTRESTO ) 24-26 MG .

## 2024-01-14 NOTE — Telephone Encounter (Signed)
 Shared message from Pharm D with patient. Patient verbalized understanding and expressed appreciation for follow-up.

## 2024-01-14 NOTE — Anesthesia Postprocedure Evaluation (Signed)
 Anesthesia Post Note  Patient: Traci Roy  Procedure(s) Performed: REPAIR, TENDON, GLUTEUS MEDIUS, OPEN (Left: Hip)     Patient location during evaluation: PACU Anesthesia Type: General Level of consciousness: awake and alert Pain management: pain level controlled Vital Signs Assessment: post-procedure vital signs reviewed and stable Respiratory status: spontaneous breathing, nonlabored ventilation, respiratory function stable and patient connected to nasal cannula oxygen  Cardiovascular status: blood pressure returned to baseline and stable Postop Assessment: no apparent nausea or vomiting Anesthetic complications: no   No notable events documented.  Last Vitals:  Vitals:   01/13/24 1615 01/13/24 1645  BP: 135/86 (!) 112/56  Pulse: 72 94  Resp: 14 18  Temp:  36.6 C  SpO2: 98% 97%    Last Pain:  Vitals:   01/13/24 1657  TempSrc:   PainSc: 5                  Traci Roy

## 2024-01-14 NOTE — Telephone Encounter (Signed)
 Patient had surgery yesterday and was given Ibuprofen  800 mg, she is asking if OK to take while also taking Entresto .  Informed patient this combination could affect her kidneys and may interfere with BP control. Will forward to pharmacy team to review and let us  know if OK for her to take (or even half dose) for short-term pain management following surgery.

## 2024-01-14 NOTE — Telephone Encounter (Signed)
 Pt called stating she is just opening her medications she was given and there isn't an antibiotic in there. I told I don't really know if he gives antibiotics just because she had surgery but that I would send you a message to confirm  [phone]

## 2024-01-16 ENCOUNTER — Encounter (HOSPITAL_BASED_OUTPATIENT_CLINIC_OR_DEPARTMENT_OTHER): Payer: Self-pay | Admitting: Orthopaedic Surgery

## 2024-01-17 ENCOUNTER — Encounter (HOSPITAL_BASED_OUTPATIENT_CLINIC_OR_DEPARTMENT_OTHER): Admitting: Orthopaedic Surgery

## 2024-01-17 ENCOUNTER — Ambulatory Visit (HOSPITAL_BASED_OUTPATIENT_CLINIC_OR_DEPARTMENT_OTHER): Payer: Self-pay | Admitting: Physical Therapy

## 2024-01-17 ENCOUNTER — Encounter (HOSPITAL_BASED_OUTPATIENT_CLINIC_OR_DEPARTMENT_OTHER): Payer: Self-pay | Admitting: Physical Therapy

## 2024-01-17 ENCOUNTER — Other Ambulatory Visit: Payer: Self-pay

## 2024-01-17 DIAGNOSIS — M6281 Muscle weakness (generalized): Secondary | ICD-10-CM

## 2024-01-17 DIAGNOSIS — R29898 Other symptoms and signs involving the musculoskeletal system: Secondary | ICD-10-CM | POA: Insufficient documentation

## 2024-01-17 DIAGNOSIS — R2689 Other abnormalities of gait and mobility: Secondary | ICD-10-CM | POA: Diagnosis not present

## 2024-01-17 DIAGNOSIS — M25552 Pain in left hip: Secondary | ICD-10-CM | POA: Insufficient documentation

## 2024-01-17 DIAGNOSIS — S76012A Strain of muscle, fascia and tendon of left hip, initial encounter: Secondary | ICD-10-CM | POA: Insufficient documentation

## 2024-01-17 NOTE — Therapy (Signed)
 OUTPATIENT PHYSICAL THERAPY LOWER EXTREMITY EVALUATION   Patient Name: Traci Roy MRN: [id number] DOB:25-Jan-1961, 64 y.o., female Today's Date: 01/17/2024  END OF SESSION:  PT End of Session - 01/17/24 1102     Visit Number 1    Number of Visits 24    Date for Recertification  04/10/24    Authorization Type BLUE [employer]    PT Start Time 1102    PT Stop Time 1146    PT Time Calculation (min) 44 min    Activity Tolerance Patient tolerated treatment well    Behavior During Therapy WFL for tasks assessed/performed          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   CHF (congestive heart failure) (HCC)    Depression    takes Lexapro  daily   Dizziness    occasionally and no meds   Flu    end of Dec 2014   GERD (gastroesophageal reflux disease)    occasionally and no meds required   History of bronchitis    last time many yrs ago   History of duodenal ulcer    History of shingles 3-5 YRS AGO   Hypertension    takes Losartan  and HCTZ daily   Joint pain    Restless legs    Past Surgical History:  Procedure Laterality Date   BREAST ENHANCEMENT SURGERY Bilateral    CESAREAN SECTION     X 1   COLONOSCOPY     CORONARY PRESSURE/FFR STUDY N/A 12/18/2019   Procedure: INTRAVASCULAR PRESSURE WIRE/FFR STUDY;  Surgeon: Dann Candyce RAMAN, MD;  Location: [facility] INVASIVE CV [facility];  Service: Cardiovascular;  Laterality: N/A;   CORONARY STENT INTERVENTION N/A 12/18/2019   Procedure: CORONARY STENT INTERVENTION;  Surgeon: Dann Candyce RAMAN, MD;  Location: [facility] INVASIVE CV LAB;  Service: Cardiovascular;  Laterality: N/A;   enlarged bladder as a child     HAD SURGERY TO ENLARGE BLADDER   LAPAROSCOPIC GASTRIC SLEEVE RESECTION N/A 10/24/2021   Procedure: LAPAROSCOPIC SLEEVE GASTRECTOMY WITH HIATAL HERNIA REPAIR;  Surgeon: Signe Mitzie LABOR, MD;  Location: [facility];  Service: General;  Laterality: N/A;    OPEN SURGICAL REPAIR OF GLUTEAL TENDON Left 01/13/2024   Procedure: REPAIR, TENDON, GLUTEUS MEDIUS, OPEN;  Surgeon: Genelle Standing, MD;  Location: [facility];  Service: Orthopedics;  Laterality: Left;  LEFT GLUTEUS MAXIMUS TENDON TRANSFER   RIGHT/LEFT HEART CATH AND CORONARY ANGIOGRAPHY N/A 12/18/2019   Procedure: RIGHT/LEFT HEART CATH AND CORONARY ANGIOGRAPHY;  Surgeon: Dann Candyce RAMAN, MD;  Location: [facility] INVASIVE CV LAB;  Service: Cardiovascular;  Laterality: N/A;   TONSILLECTOMY     TOTAL HIP ARTHROPLASTY Right 04/14/2013   Procedure: TOTAL HIP ARTHROPLASTY ANTERIOR APPROACH;  Surgeon: Maude KANDICE Herald, MD;  Location: [facility] OR;  Service: Orthopedics;  Laterality: Right;   UPPER GI ENDOSCOPY N/A 10/24/2021   Procedure: UPPER GI ENDOSCOPY;  Surgeon: Signe Mitzie LABOR, MD;  Location: [facility];  Service: General;  Laterality: N/A;   Patient Active Problem List   Diagnosis Date Noted   Tear of left gluteus medius tendon 01/13/2024   Panniculitis 03/12/2023   Back pain 03/12/2023   Impacted cerumen of both ears 03/09/2023   Chronic rhinitis 03/09/2023   Other specified disorders of eustachian tube, bilateral 03/09/2023   GERD (gastroesophageal reflux disease)    Preop cardiovascular exam 06/23/2021   Morbid obesity (  HCC) 04/10/2021   Type 2 diabetes mellitus with complication, without long-term current use of insulin  (HCC) 04/10/2021   CAD (coronary artery disease) 11/06/2020   Left ventricular dysfunction 11/06/2020   Hyperlipidemia 11/06/2020   Angina pectoris    Essential hypertension 10/22/2019   Pneumonia due to COVID-19 virus 10/22/2019   Acute respiratory failure with hypoxia (HCC) 10/22/2019   AKI (acute kidney injury) 10/22/2019   Hypokalemia 10/22/2019   Degenerative joint disease (DJD) of hip 04/14/2013    PCP: Dyane Anthony RAMAN, FNP   REFERRING PROVIDER: Genelle Standing, MD  REFERRING DIAG: S76.012A (ICD-10-CM) - Tear of left gluteus medius tendon, initial  encounter  PROCEDURE: 1. Left hip gluteus maximus tendon transfer 2. Left hip open trochanteric bursectomy  THERAPY DIAG:  Pain in left hip  Muscle weakness (generalized)  Other abnormalities of gait and mobility  Other symptoms and signs involving the musculoskeletal system  Rationale for Evaluation and Treatment: Rehabilitation  ONSET DATE: DOS 01/13/24  SUBJECTIVE:   SUBJECTIVE STATEMENT: Patient states some soreness in hip. Has had a good bit of bleeding on dressing. Has been walking with crutches most of the time. She feels like she is getting around better.   PERTINENT HISTORY: CHF, hx depression/anxiety, HTN PAIN:  Are you having pain? Yes: NPRS scale: 5/10 Pain location: L hip Pain description: dull/sharp Aggravating factors: movement Relieving factors: rest  PRECAUTIONS: Other: glute repair  WEIGHT BEARING RESTRICTIONS: 50% weightbearing for total of 2 weeks  FALLS:  Has patient fallen in last 6 months? No  OCCUPATION: Retired  PLOF: Independent  PATIENT GOALS: get back to where she was before, wants to start exercising   OBJECTIVE: (objective measures from initial evaluation unless otherwise dated) OBSERVATION:  sutures intact, significant old drainage/blood but wet still   PATIENT SURVEYS:  LEFS  Extreme difficulty/unable (0), Quite a bit of difficulty (1), Moderate difficulty (2), Little difficulty (3), No difficulty (4) Survey date:  01/17/24  Any of your usual work, housework or school activities 0  2. Usual hobbies, recreational or sporting activities 0  3. Getting into/out of the bath 0  4. Walking between rooms 2  5. Putting on socks/shoes 2  6. Squatting  2  7. Lifting an object, like a bag of groceries from the floor 1  8. Performing light activities around your home 2  9. Performing heavy activities around your home 0  10. Getting into/out of a car 1  11. Walking 2 blocks 0  12. Walking 1 mile 0  13. Going up/down 10 stairs (1 flight)  0  14. Standing for 1 hour 0  15.  sitting for 1 hour 2  16. Running on even ground 0  17. Running on uneven ground 0  18. Making sharp turns while running fast 0  19. Hopping  0  20. Rolling over in bed 1  Score total:  13/80     COGNITION: Overall cognitive status: Within functional limits for tasks assessed     SENSATION: WFL   PALPATION: Grossly tender throughout L hip  LOWER EXTREMITY ROM:  Passive ROM Right eval Left eval  Hip flexion  90  Hip extension    Hip abduction    Hip adduction    Hip internal rotation    Hip external rotation    Knee flexion    Knee extension    Ankle dorsiflexion    Ankle plantarflexion    Ankle inversion    Ankle eversion     (Blank rows =  not tested) *= pain/symptoms  LOWER EXTREMITY MMT: NT fully due to post op status  MMT Right eval Left eval  Hip flexion    Hip extension    Hip abduction    Hip adduction    Hip internal rotation    Hip external rotation    Knee flexion    Knee extension    Ankle dorsiflexion    Ankle plantarflexion    Ankle inversion    Ankle eversion     (Blank rows = not tested) *= pain/symptoms    FUNCTIONAL TESTS:  NT due to post op status  GAIT: Distance walked: 50 feet Assistive device utilized: Crutches Level of assistance: Modified independence Comments: improved form following crutch adjustment and cueing   TODAY'S TREATMENT:                                                                                                                              DATE:  01/17/24 Eval, education, HEP, dressing change  Crutch use and gait mechanics   PATIENT EDUCATION:  Education details: Patient educated on exam findings, POC, scope of PT, HEP, relevant anatomy and biomechanics, and protocol/precautions, dressing changes. Person educated: Patient Education method: Explanation, Demonstration, and Handouts Education comprehension: verbalized understanding, returned demonstration, verbal  cues required, and tactile cues required  HOME EXERCISE PROGRAM: Access Code: [email or web address] URL: [email or web address] Date: 01/17/2024 Prepared by: Prentice Ferdie Bakken  Exercises - Gluteal Sets  - 3 x daily - 7 x weekly - 10 reps - 5-10 second hold - Supine Hip Adduction Isometric with Ball  - 3 x daily - 7 x weekly - 10 reps - 5-10 second hold - Seated Long Arc Quad  - 3 x daily - 7 x weekly - 3 sets - 10 reps - Prone Knee Flexion  - 3 x daily - 7 x weekly - 2 sets - 10 reps  ASSESSMENT:  CLINICAL IMPRESSION: Patient a 63 y.o. y.o. female who was seen today for physical therapy evaluation and treatment for Left hip gluteus maximus tendon transfer DOS 01/13/24. Patient presents with pain limited deficits in L hip strength, ROM, endurance, activity tolerance, and functional mobility with ADL. Patient is having to modify and restrict ADL as indicated by outcome measure score as well as subjective information and objective measures which is affecting overall participation. Patient will benefit from skilled physical therapy in order to improve function and reduce impairment.  OBJECTIVE IMPAIRMENTS: Abnormal gait, decreased activity tolerance, decreased balance, decreased endurance, decreased mobility, difficulty walking, decreased ROM, decreased strength, increased muscle spasms, impaired flexibility, improper body mechanics, and pain  ACTIVITY LIMITATIONS: lifting, bending, standing, squatting, stairs, transfers, bathing, dressing, hygiene/grooming, locomotion level, and caring for others  PARTICIPATION LIMITATIONS: meal prep, cleaning, laundry, shopping, community activity, occupation, and yard work  PERSONAL FACTORS: 3+ comorbidities: CHF, hx depression/anxiety, HTN are also affecting patient's functional outcome.   REHAB POTENTIAL: Good  CLINICAL DECISION MAKING: Evolving/moderate complexity  EVALUATION COMPLEXITY: Moderate  GOALS: Goals reviewed with patient? Yes  SHORT  TERM GOALS: Target date: 02/28/2024    Patient will be independent with HEP in order to improve functional outcomes. Baseline: Goal status: INITIAL  2.  Patient will report at least 25% improvement in symptoms for improved quality of life. Baseline: Goal status: INITIAL  3.  Pain free flexion to 90 Baseline:  Goal status: INITIAL  4.  SLS 30s without compensation Baseline:  Goal status: INITIAL    LONG TERM GOALS: Target date: 04/10/2024    Patient will report at least 75% improvement in symptoms for improved quality of life. Baseline:  Goal status: INITIAL  2.  Patient will improve LEFS score by at least 18 points in order to indicate improved tolerance to activity. Baseline:  Goal status: INITIAL  3.  Patient will be able to navigate stairs with reciprocal pattern without compensation in order to demonstrate improved LE strength. Baseline:  Goal status: INITIAL  4.  Patient will demonstrate MMT 80% of opposite lower extremity in all tested musculature as evidence of improved strength to assist with stair ambulation and gait. Baseline:  Goal status: INITIAL  5.  Patient will be able to return to all activities unrestricted for improved ability to perform work functions and participate with family.  Baseline:  Goal status: INITIAL     PLAN:  PT FREQUENCY: 1-2x/week  PT DURATION: 12 weeks  PLANNED INTERVENTIONS: [id number]- PT Re-evaluation, 97110-Therapeutic exercises, 97530- Therapeutic activity, [id number]- Neuromuscular re-education, 97535- Self Care, [id number]- Manual therapy, [id number]- Gait training, [phone]- Orthotic Fit/training, [phone]- Canalith repositioning, [id number]- Aquatic Therapy, [phone]- Splinting, [phone]- Wound care (first 20 sq cm), 97598- Wound care (each additional 20 sq cm)Patient/Family education, Balance training, Stair training, Taping, Dry Needling, Joint mobilization, Joint manipulation, Spinal manipulation, Spinal mobilization, Scar mobilization, and DME  instructions.  PLAN FOR NEXT SESSION: progress with hip gluteus maximus tendon transfer protocol   Prentice GORMAN Stains, PT, DPT 01/17/2024, 12:04 PM

## 2024-01-21 DIAGNOSIS — F4321 Adjustment disorder with depressed mood: Secondary | ICD-10-CM | POA: Diagnosis not present

## 2024-01-22 ENCOUNTER — Ambulatory Visit (HOSPITAL_BASED_OUTPATIENT_CLINIC_OR_DEPARTMENT_OTHER): Payer: Self-pay

## 2024-01-22 NOTE — Therapy (Incomplete)
 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   CHF (congestive heart failure) (HCC)    Depression    takes Lexapro  daily   Dizziness    occasionally and no meds   Flu    end of Dec 2014   GERD (gastroesophageal reflux disease)    occasionally and no meds required   History of bronchitis    last time many yrs ago   History of duodenal ulcer    History of shingles 3-5 YRS AGO   Hypertension    takes Losartan  and HCTZ daily   Joint pain    Restless legs    Past Surgical History:  Procedure Laterality Date   BREAST ENHANCEMENT SURGERY Bilateral    CESAREAN SECTION     X 1   COLONOSCOPY     CORONARY PRESSURE/FFR STUDY N/A 12/18/2019   Procedure: INTRAVASCULAR PRESSURE WIRE/FFR STUDY;  Surgeon: Dann Candyce RAMAN, MD;  Location: [facility];  Service: Cardiovascular;  Laterality: N/A;   CORONARY STENT INTERVENTION N/A 12/18/2019   Procedure: CORONARY STENT INTERVENTION;  Surgeon: Dann Candyce RAMAN, MD;  Location: [facility] INVASIVE CV LAB;  Service: Cardiovascular;  Laterality: N/A;   enlarged bladder as a child     HAD SURGERY TO ENLARGE BLADDER   LAPAROSCOPIC GASTRIC SLEEVE RESECTION N/A 10/24/2021   Procedure: LAPAROSCOPIC SLEEVE GASTRECTOMY WITH HIATAL HERNIA REPAIR;  Surgeon: Signe Mitzie LABOR, MD;  Location: [facility];  Service: General;  Laterality: N/A;   OPEN SURGICAL REPAIR OF GLUTEAL TENDON Left 01/13/2024   Procedure: REPAIR, TENDON, GLUTEUS MEDIUS, OPEN;  Surgeon: Genelle Standing, MD;  Location: [facility];  Service: Orthopedics;  Laterality: Left;  LEFT GLUTEUS MAXIMUS TENDON TRANSFER   RIGHT/LEFT HEART CATH AND CORONARY ANGIOGRAPHY N/A 12/18/2019   Procedure: RIGHT/LEFT HEART CATH AND CORONARY  ANGIOGRAPHY;  Surgeon: Dann Candyce RAMAN, MD;  Location: [facility] INVASIVE CV LAB;  Service: Cardiovascular;  Laterality: N/A;   TONSILLECTOMY     TOTAL HIP ARTHROPLASTY Right 04/14/2013   Procedure: TOTAL HIP ARTHROPLASTY ANTERIOR APPROACH;  Surgeon: Maude KANDICE Herald, MD;  Location: [facility];  Service: Orthopedics;  Laterality: Right;   UPPER GI ENDOSCOPY N/A 10/24/2021   Procedure: UPPER GI ENDOSCOPY;  Surgeon: Signe Mitzie LABOR, MD;  Location: [facility];  Service: General;  Laterality: N/A;   Patient Active Problem List   Diagnosis Date Noted   Tear of left gluteus medius tendon 01/13/2024   Panniculitis 03/12/2023   Back pain 03/12/2023   Impacted cerumen of both ears 03/09/2023   Chronic rhinitis 03/09/2023   Other specified disorders of eustachian tube, bilateral 03/09/2023   GERD (gastroesophageal reflux disease)    Preop cardiovascular exam 06/23/2021   Morbid obesity (HCC) 04/10/2021   Type 2 diabetes mellitus with complication, without long-term current use of insulin  (HCC) 04/10/2021   CAD (coronary artery disease) 11/06/2020   Left ventricular dysfunction 11/06/2020   Hyperlipidemia 11/06/2020   Angina pectoris    Essential hypertension 10/22/2019   Pneumonia due to COVID-19 virus 10/22/2019   Acute respiratory failure with hypoxia (HCC) 10/22/2019   AKI (acute kidney injury) 10/22/2019   Hypokalemia 10/22/2019   Degenerative joint disease (DJD) of hip 04/14/2013    PCP: Dyane,  Anthony RAMAN, FNP   REFERRING PROVIDER: Genelle Standing, MD  REFERRING DIAG: [phone] (ICD-10-CM) - Tear of left gluteus medius tendon, initial encounter  PROCEDURE: 1. Left hip gluteus maximus tendon transfer 2. Left hip open trochanteric bursectomy  THERAPY DIAG:  No diagnosis found.  Rationale for Evaluation and Treatment: Rehabilitation  ONSET DATE: DOS 01/13/24  SUBJECTIVE:   SUBJECTIVE STATEMENT: Patient states some soreness in hip. Has had a good bit of bleeding on dressing. Has been walking  with crutches most of the time. She feels like she is getting around better.   PERTINENT HISTORY: CHF, hx depression/anxiety, HTN PAIN:  Are you having pain? Yes: NPRS scale: 5/10 Pain location: L hip Pain description: dull/sharp Aggravating factors: movement Relieving factors: rest  PRECAUTIONS: Other: glute repair  WEIGHT BEARING RESTRICTIONS: 50% weightbearing for total of 2 weeks  FALLS:  Has patient fallen in last 6 months? No  OCCUPATION: Retired  PLOF: Independent  PATIENT GOALS: get back to where she was before, wants to start exercising   OBJECTIVE: (objective measures from initial evaluation unless otherwise dated) OBSERVATION:  sutures intact, significant old drainage/blood but wet still   PATIENT SURVEYS:  LEFS  Extreme difficulty/unable (0), Quite a bit of difficulty (1), Moderate difficulty (2), Little difficulty (3), No difficulty (4) Survey date:  01/17/24  Any of your usual work, housework or school activities 0  2. Usual hobbies, recreational or sporting activities 0  3. Getting into/out of the bath 0  4. Walking between rooms 2  5. Putting on socks/shoes 2  6. Squatting  2  7. Lifting an object, like a bag of groceries from the floor 1  8. Performing light activities around your home 2  9. Performing heavy activities around your home 0  10. Getting into/out of a car 1  11. Walking 2 blocks 0  12. Walking 1 mile 0  13. Going up/down 10 stairs (1 flight) 0  14. Standing for 1 hour 0  15.  sitting for 1 hour 2  16. Running on even ground 0  17. Running on uneven ground 0  18. Making sharp turns while running fast 0  19. Hopping  0  20. Rolling over in bed 1  Score total:  13/80     COGNITION: Overall cognitive status: Within functional limits for tasks assessed     SENSATION: WFL   PALPATION: Grossly tender throughout L hip  LOWER EXTREMITY ROM:  Passive ROM Right eval Left eval  Hip flexion  90  Hip extension    Hip abduction     Hip adduction    Hip internal rotation    Hip external rotation    Knee flexion    Knee extension    Ankle dorsiflexion    Ankle plantarflexion    Ankle inversion    Ankle eversion     (Blank rows = not tested) *= pain/symptoms  LOWER EXTREMITY MMT: NT fully due to post op status  MMT Right eval Left eval  Hip flexion    Hip extension    Hip abduction    Hip adduction    Hip internal rotation    Hip external rotation    Knee flexion    Knee extension    Ankle dorsiflexion    Ankle plantarflexion    Ankle inversion    Ankle eversion     (Blank rows = not tested) *= pain/symptoms    FUNCTIONAL TESTS:  NT due to post op status  GAIT: Distance  walked: 50 feet Assistive device utilized: Crutches Level of assistance: Modified independence Comments: improved form following crutch adjustment and cueing   TODAY'S TREATMENT:                                                                                                                              DATE:  01/17/24 Eval, education, HEP, dressing change  Crutch use and gait mechanics   PATIENT EDUCATION:  Education details: Patient educated on exam findings, POC, scope of PT, HEP, relevant anatomy and biomechanics, and protocol/precautions, dressing changes. Person educated: Patient Education method: Explanation, Demonstration, and Handouts Education comprehension: verbalized understanding, returned demonstration, verbal cues required, and tactile cues required  HOME EXERCISE PROGRAM: Access Code: [email or web address] URL: [email or web address] Date: 01/17/2024 Prepared by: Prentice Zaunegger  Exercises - Gluteal Sets  - 3 x daily - 7 x weekly - 10 reps - 5-10 second hold - Supine Hip Adduction Isometric with Ball  - 3 x daily - 7 x weekly - 10 reps - 5-10 second hold - Seated Long Arc Quad  - 3 x daily - 7 x weekly - 3 sets - 10 reps - Prone Knee Flexion  - 3 x daily - 7 x weekly - 2 sets - 10  reps  ASSESSMENT:  CLINICAL IMPRESSION: Patient a 63 y.o. y.o. female who was seen today for physical therapy evaluation and treatment for Left hip gluteus maximus tendon transfer DOS 01/13/24. Patient presents with pain limited deficits in L hip strength, ROM, endurance, activity tolerance, and functional mobility with ADL. Patient is having to modify and restrict ADL as indicated by outcome measure score as well as subjective information and objective measures which is affecting overall participation. Patient will benefit from skilled physical therapy in order to improve function and reduce impairment.  OBJECTIVE IMPAIRMENTS: Abnormal gait, decreased activity tolerance, decreased balance, decreased endurance, decreased mobility, difficulty walking, decreased ROM, decreased strength, increased muscle spasms, impaired flexibility, improper body mechanics, and pain  ACTIVITY LIMITATIONS: lifting, bending, standing, squatting, stairs, transfers, bathing, dressing, hygiene/grooming, locomotion level, and caring for others  PARTICIPATION LIMITATIONS: meal prep, cleaning, laundry, shopping, community activity, occupation, and yard work  PERSONAL FACTORS: 3+ comorbidities: CHF, hx depression/anxiety, HTN are also affecting patient's functional outcome.   REHAB POTENTIAL: Good  CLINICAL DECISION MAKING: Evolving/moderate complexity  EVALUATION COMPLEXITY: Moderate   GOALS: Goals reviewed with patient? Yes  SHORT TERM GOALS: Target date: 02/28/2024    Patient will be independent with HEP in order to improve functional outcomes. Baseline: Goal status: INITIAL  2.  Patient will report at least 25% improvement in symptoms for improved quality of life. Baseline: Goal status: INITIAL  3.  Pain free flexion to 90 Baseline:  Goal status: INITIAL  4.  SLS 30s without compensation Baseline:  Goal status: INITIAL    LONG TERM GOALS: Target date: 04/10/2024    Patient will report at least  75% improvement in symptoms for improved quality of life. Baseline:  Goal status: INITIAL  2.  Patient will improve LEFS score by at least 18 points in order to indicate improved tolerance to activity. Baseline:  Goal status: INITIAL  3.  Patient will be able to navigate stairs with reciprocal pattern without compensation in order to demonstrate improved LE strength. Baseline:  Goal status: INITIAL  4.  Patient will demonstrate MMT 80% of opposite lower extremity in all tested musculature as evidence of improved strength to assist with stair ambulation and gait. Baseline:  Goal status: INITIAL  5.  Patient will be able to return to all activities unrestricted for improved ability to perform work functions and participate with family.  Baseline:  Goal status: INITIAL     PLAN:  PT FREQUENCY: 1-2x/week  PT DURATION: 12 weeks  PLANNED INTERVENTIONS: 97164- PT Re-evaluation, 97110-Therapeutic exercises, 97530- Therapeutic activity, [id number]- Neuromuscular re-education, 97535- Self Care, 02859- Manual therapy, [id number]- Gait training, [phone]- Orthotic Fit/training, [phone]- Canalith repositioning, [id number]- Aquatic Therapy, [phone]- Splinting, [phone]- Wound care (first 20 sq cm), 97598- Wound care (each additional 20 sq cm)Patient/Family education, Balance training, Stair training, Taping, Dry Needling, Joint mobilization, Joint manipulation, Spinal manipulation, Spinal mobilization, Scar mobilization, and DME instructions.  PLAN FOR NEXT SESSION: progress with hip gluteus maximus tendon transfer protocol   [place] BRAVO Ivan Lacher, PTA 01/22/2024, 8:05 AM

## 2024-01-23 ENCOUNTER — Ambulatory Visit (HOSPITAL_BASED_OUTPATIENT_CLINIC_OR_DEPARTMENT_OTHER)

## 2024-01-24 ENCOUNTER — Telehealth: Payer: Self-pay | Admitting: Orthopaedic Surgery

## 2024-01-24 ENCOUNTER — Encounter (HOSPITAL_BASED_OUTPATIENT_CLINIC_OR_DEPARTMENT_OTHER): Admitting: Orthopaedic Surgery

## 2024-01-24 NOTE — Telephone Encounter (Signed)
 Pt called to get rescheduled for her post op. She didn't mean to miss her appointment this morning. CB#[phone]

## 2024-01-24 NOTE — Telephone Encounter (Signed)
 Patient rescheduled.

## 2024-01-28 ENCOUNTER — Encounter (HOSPITAL_BASED_OUTPATIENT_CLINIC_OR_DEPARTMENT_OTHER): Payer: Self-pay

## 2024-01-28 ENCOUNTER — Ambulatory Visit (HOSPITAL_BASED_OUTPATIENT_CLINIC_OR_DEPARTMENT_OTHER): Payer: Self-pay

## 2024-01-28 DIAGNOSIS — M25552 Pain in left hip: Secondary | ICD-10-CM

## 2024-01-28 DIAGNOSIS — M6281 Muscle weakness (generalized): Secondary | ICD-10-CM

## 2024-01-28 DIAGNOSIS — S76012A Strain of muscle, fascia and tendon of left hip, initial encounter: Secondary | ICD-10-CM | POA: Diagnosis not present

## 2024-01-28 DIAGNOSIS — R2689 Other abnormalities of gait and mobility: Secondary | ICD-10-CM

## 2024-01-28 DIAGNOSIS — R29898 Other symptoms and signs involving the musculoskeletal system: Secondary | ICD-10-CM

## 2024-01-28 DIAGNOSIS — F4321 Adjustment disorder with depressed mood: Secondary | ICD-10-CM | POA: Diagnosis not present

## 2024-01-28 NOTE — Therapy (Signed)
 " OUTPATIENT PHYSICAL THERAPY LOWER EXTREMITY EVALUATION   Patient Name: Traci Roy MRN: [id number] DOB:[birth year], 63 y.o., female Today's Date: 01/28/2024  END OF SESSION:  PT End of Session - 01/28/24 1436     Visit Number 2    Number of Visits 24    Date for Recertification  04/10/24    Authorization Type [employer]    PT Start Time 1434    PT Stop Time 1515    PT Time Calculation (min) 41 min    Activity Tolerance Patient tolerated treatment well    Behavior During Therapy WFL for tasks assessed/performed           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   CHF (congestive heart failure) (HCC)    Depression    takes Lexapro  daily   Dizziness    occasionally and no meds   Flu    end of Dec 2014   GERD (gastroesophageal reflux disease)    occasionally and no meds required   History of bronchitis    last time many yrs ago   History of duodenal ulcer    History of shingles 3-5 YRS AGO   Hypertension    takes Losartan  and HCTZ daily   Joint pain    Restless legs    Past Surgical History:  Procedure Laterality Date   BREAST ENHANCEMENT SURGERY Bilateral    CESAREAN SECTION     X 1   COLONOSCOPY     CORONARY PRESSURE/FFR STUDY N/A 12/18/2019   Procedure: INTRAVASCULAR PRESSURE WIRE/FFR STUDY;  Surgeon: Dann Candyce RAMAN, MD;  Location: [facility];  Service: Cardiovascular;  Laterality: N/A;   CORONARY STENT INTERVENTION N/A 12/18/2019   Procedure: CORONARY STENT INTERVENTION;  Surgeon: Dann Candyce RAMAN, MD;  Location: [facility] INVASIVE CV LAB;  Service: Cardiovascular;  Laterality: N/A;   enlarged bladder as a child     HAD SURGERY TO ENLARGE BLADDER   LAPAROSCOPIC GASTRIC SLEEVE RESECTION N/A 10/24/2021   Procedure: LAPAROSCOPIC SLEEVE GASTRECTOMY WITH HIATAL HERNIA REPAIR;  Surgeon: Signe Mitzie LABOR, MD;  Location: [facility];  Service: General;  Laterality: N/A;    OPEN SURGICAL REPAIR OF GLUTEAL TENDON Left 01/13/2024   Procedure: REPAIR, TENDON, GLUTEUS MEDIUS, OPEN;  Surgeon: Genelle Standing, MD;  Location: [facility];  Service: Orthopedics;  Laterality: Left;  LEFT GLUTEUS MAXIMUS TENDON TRANSFER   RIGHT/LEFT HEART CATH AND CORONARY ANGIOGRAPHY N/A 12/18/2019   Procedure: RIGHT/LEFT HEART CATH AND CORONARY ANGIOGRAPHY;  Surgeon: Dann Candyce RAMAN, MD;  Location: [facility] INVASIVE CV LAB;  Service: Cardiovascular;  Laterality: N/A;   TONSILLECTOMY     TOTAL HIP ARTHROPLASTY Right 04/14/2013   Procedure: TOTAL HIP ARTHROPLASTY ANTERIOR APPROACH;  Surgeon: Maude KANDICE Herald, MD;  Location: [facility];  Service: Orthopedics;  Laterality: Right;   UPPER GI ENDOSCOPY N/A 10/24/2021   Procedure: UPPER GI ENDOSCOPY;  Surgeon: Signe Mitzie LABOR, MD;  Location: [facility];  Service: General;  Laterality: N/A;   Patient Active Problem List   Diagnosis Date Noted   Tear of left gluteus medius tendon 01/13/2024   Panniculitis 03/12/2023   Back pain 03/12/2023   Impacted cerumen of both ears 03/09/2023   Chronic rhinitis 03/09/2023   Other specified disorders of eustachian tube, bilateral 03/09/2023   GERD (gastroesophageal reflux disease)    Preop cardiovascular exam 06/23/2021  Morbid obesity (HCC) 04/10/2021   Type 2 diabetes mellitus with complication, without long-term current use of insulin  (HCC) 04/10/2021   CAD (coronary artery disease) 11/06/2020   Left ventricular dysfunction 11/06/2020   Hyperlipidemia 11/06/2020   Angina pectoris    Essential hypertension 10/22/2019   Pneumonia due to COVID-19 virus 10/22/2019   Acute respiratory failure with hypoxia (HCC) 10/22/2019   AKI (acute kidney injury) 10/22/2019   Hypokalemia 10/22/2019   Degenerative joint disease (DJD) of hip 04/14/2013    PCP: Dyane Anthony RAMAN, FNP   REFERRING PROVIDER: Genelle Standing, MD  REFERRING DIAG: S76.012A (ICD-10-CM) - Tear of left gluteus medius tendon, initial  encounter  PROCEDURE: 1. Left hip gluteus maximus tendon transfer 2. Left hip open trochanteric bursectomy  THERAPY DIAG:  Other symptoms and signs involving the musculoskeletal system  Other abnormalities of gait and mobility  Muscle weakness (generalized)  Pain in left hip  Rationale for Evaluation and Treatment: Rehabilitation  ONSET DATE: DOS 01/13/24  SUBJECTIVE:   SUBJECTIVE STATEMENT:  Pt arrives with single crutch. Has some back pain along with hip pain. Has trouble sleeping in bed.   Eval: Patient states some soreness in hip. Has had a good bit of bleeding on dressing. Has been walking with crutches most of the time. She feels like she is getting around better.   PERTINENT HISTORY: CHF, hx depression/anxiety, HTN PAIN:  Are you having pain? Yes: NPRS scale: 3-4/10 Pain location: L hip Pain description: dull/sharp Aggravating factors: movement Relieving factors: rest  PRECAUTIONS: Other: glute repair  WEIGHT BEARING RESTRICTIONS: 50% weightbearing for total of 2 weeks  FALLS:  Has patient fallen in last 6 months? No  OCCUPATION: Retired  PLOF: Independent  PATIENT GOALS: get back to where she was before, wants to start exercising   OBJECTIVE: (objective measures from initial evaluation unless otherwise dated) OBSERVATION:  sutures intact, significant old drainage/blood but wet still   PATIENT SURVEYS:  LEFS  Extreme difficulty/unable (0), Quite a bit of difficulty (1), Moderate difficulty (2), Little difficulty (3), No difficulty (4) Survey date:  01/17/24  Any of your usual work, housework or school activities 0  2. Usual hobbies, recreational or sporting activities 0  3. Getting into/out of the bath 0  4. Walking between rooms 2  5. Putting on socks/shoes 2  6. Squatting  2  7. Lifting an object, like a bag of groceries from the floor 1  8. Performing light activities around your home 2  9. Performing heavy activities around your home 0  10.  Getting into/out of a car 1  11. Walking 2 blocks 0  12. Walking 1 mile 0  13. Going up/down 10 stairs (1 flight) 0  14. Standing for 1 hour 0  15.  sitting for 1 hour 2  16. Running on even ground 0  17. Running on uneven ground 0  18. Making sharp turns while running fast 0  19. Hopping  0  20. Rolling over in bed 1  Score total:  13/80     COGNITION: Overall cognitive status: Within functional limits for tasks assessed     SENSATION: WFL   PALPATION: Grossly tender throughout L hip  LOWER EXTREMITY ROM:  Passive ROM Right eval Left eval  Hip flexion  90  Hip extension    Hip abduction    Hip adduction    Hip internal rotation    Hip external rotation    Knee flexion    Knee extension  Ankle dorsiflexion    Ankle plantarflexion    Ankle inversion    Ankle eversion     (Blank rows = not tested) *= pain/symptoms  LOWER EXTREMITY MMT: NT fully due to post op status  MMT Right eval Left eval  Hip flexion    Hip extension    Hip abduction    Hip adduction    Hip internal rotation    Hip external rotation    Knee flexion    Knee extension    Ankle dorsiflexion    Ankle plantarflexion    Ankle inversion    Ankle eversion     (Blank rows = not tested) *= pain/symptoms    FUNCTIONAL TESTS:  NT due to post op status  GAIT: Distance walked: 50 feet Assistive device utilized: Crutches Level of assistance: Modified independence Comments: improved form following crutch adjustment and cueing   TODAY'S TREATMENT:                                                                                                                              DATE:   12/23 PROM L hip Hooklying adductor squeeze Hooklying glute sqz LAQ 2.5# 3x10 STS elevated plinth x5 Standing HR x20 Nu-step x74min L3 Bandage change/inspection of incision.    01/17/24 Eval, education, HEP, dressing change  Crutch use and gait mechanics   PATIENT EDUCATION:  Education details:  Patient educated on exam findings, POC, scope of PT, HEP, relevant anatomy and biomechanics, and protocol/precautions, dressing changes. Person educated: Patient Education method: Explanation, Demonstration, and Handouts Education comprehension: verbalized understanding, returned demonstration, verbal cues required, and tactile cues required  HOME EXERCISE PROGRAM: Access Code: [email or web address] URL: [email or web address] Date: 01/17/2024 Prepared by: Prentice Zaunegger  Exercises - Gluteal Sets  - 3 x daily - 7 x weekly - 10 reps - 5-10 second hold - Supine Hip Adduction Isometric with Ball  - 3 x daily - 7 x weekly - 10 reps - 5-10 second hold - Seated Long Arc Quad  - 3 x daily - 7 x weekly - 3 sets - 10 reps - Prone Knee Flexion  - 3 x daily - 7 x weekly - 2 sets - 10 reps  ASSESSMENT:  CLINICAL IMPRESSION:  Pt 2 weeks s/p. Good tolerance for exercises and PROM per protocol. Reviewed healing timeline/protocol as well as precautions with verbalized understanding. Changed bandage today with incision appearing to heal well. No  signs of infection or irritation. Pt has f/u with surgeon on Monday.    Eval: Patient a 63 y.o. y.o. female who was seen today for physical therapy evaluation and treatment for Left hip gluteus maximus tendon transfer DOS 01/13/24. Patient presents with pain limited deficits in L hip strength, ROM, endurance, activity tolerance, and functional mobility with ADL. Patient is having to modify and restrict ADL as indicated by outcome measure score as well as subjective information and objective measures which is affecting overall participation. Patient will benefit  from skilled physical therapy in order to improve function and reduce impairment.  OBJECTIVE IMPAIRMENTS: Abnormal gait, decreased activity tolerance, decreased balance, decreased endurance, decreased mobility, difficulty walking, decreased ROM, decreased strength, increased muscle spasms, impaired  flexibility, improper body mechanics, and pain  ACTIVITY LIMITATIONS: lifting, bending, standing, squatting, stairs, transfers, bathing, dressing, hygiene/grooming, locomotion level, and caring for others  PARTICIPATION LIMITATIONS: meal prep, cleaning, laundry, shopping, community activity, occupation, and yard work  PERSONAL FACTORS: 3+ comorbidities: CHF, hx depression/anxiety, HTN are also affecting patient's functional outcome.   REHAB POTENTIAL: Good  CLINICAL DECISION MAKING: Evolving/moderate complexity  EVALUATION COMPLEXITY: Moderate   GOALS: Goals reviewed with patient? Yes  SHORT TERM GOALS: Target date: 02/28/2024    Patient will be independent with HEP in order to improve functional outcomes. Baseline: Goal status: MET 12/23  2.  Patient will report at least 25% improvement in symptoms for improved quality of life. Baseline: Goal status: INITIAL  3.  Pain free flexion to 90 Baseline:  Goal status: INITIAL  4.  SLS 30s without compensation Baseline:  Goal status: INITIAL    LONG TERM GOALS: Target date: 04/10/2024    Patient will report at least 75% improvement in symptoms for improved quality of life. Baseline:  Goal status: INITIAL  2.  Patient will improve LEFS score by at least 18 points in order to indicate improved tolerance to activity. Baseline:  Goal status: INITIAL  3.  Patient will be able to navigate stairs with reciprocal pattern without compensation in order to demonstrate improved LE strength. Baseline:  Goal status: INITIAL  4.  Patient will demonstrate MMT 80% of opposite lower extremity in all tested musculature as evidence of improved strength to assist with stair ambulation and gait. Baseline:  Goal status: INITIAL  5.  Patient will be able to return to all activities unrestricted for improved ability to perform work functions and participate with family.  Baseline:  Goal status: INITIAL     PLAN:  PT FREQUENCY:  1-2x/week  PT DURATION: 12 weeks  PLANNED INTERVENTIONS: 97164- PT Re-evaluation, 97110-Therapeutic exercises, 97530- Therapeutic activity, [id number]- Neuromuscular re-education, 97535- Self Care, 02859- Manual therapy, [id number]- Gait training, [phone]- Orthotic Fit/training, [phone]- Canalith repositioning, [id number]- Aquatic Therapy, [phone]- Splinting, [phone]- Wound care (first 20 sq cm), 97598- Wound care (each additional 20 sq cm)Patient/Family education, Balance training, Stair training, Taping, Dry Needling, Joint mobilization, Joint manipulation, Spinal manipulation, Spinal mobilization, Scar mobilization, and DME instructions.  PLAN FOR NEXT SESSION: progress with hip gluteus maximus tendon transfer protocol   Traci Roy, PTA 01/28/2024, 4:22 PM  "

## 2024-02-03 ENCOUNTER — Ambulatory Visit (INDEPENDENT_AMBULATORY_CARE_PROVIDER_SITE_OTHER): Admitting: Student

## 2024-02-03 DIAGNOSIS — S76012A Strain of muscle, fascia and tendon of left hip, initial encounter: Secondary | ICD-10-CM

## 2024-02-03 NOTE — Progress Notes (Unsigned)
 "                                Post Operative Evaluation    Procedure/Date of Surgery: Left hip gluteus maximus tendon transfer with trochanteric bursectomy 01/13/2024  Interval History:   Patient presents to clinic today 3 weeks status post the above procedure.  She reports today that her recovery has been going well.  She experiences mild soreness around the incision but no significant pain.  She is not having to take pain medication.  She has now had 2 sessions with physical therapy.   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   CHF (congestive heart failure) (HCC)    Depression    takes Lexapro  daily   Dizziness    occasionally and no meds   Flu    end of Dec 2014   GERD (gastroesophageal reflux disease)    occasionally and no meds required   History of bronchitis    last time many yrs ago   History of duodenal ulcer    History of shingles 3-5 YRS AGO   Hypertension    takes Losartan  and HCTZ daily   Joint pain    Restless legs    Past Surgical History:  Procedure Laterality Date   BREAST ENHANCEMENT SURGERY Bilateral    CESAREAN SECTION     X 1   COLONOSCOPY     CORONARY PRESSURE/FFR STUDY N/A 12/18/2019   Procedure: INTRAVASCULAR PRESSURE WIRE/FFR STUDY;  Surgeon: Dann Candyce RAMAN, MD;  Location: [facility];  Service: Cardiovascular;  Laterality: N/A;   CORONARY STENT INTERVENTION N/A 12/18/2019   Procedure: CORONARY STENT INTERVENTION;  Surgeon: Dann Candyce RAMAN, MD;  Location: [facility] INVASIVE CV [facility];  Service: Cardiovascular;  Laterality: N/A;   enlarged bladder as a child     HAD SURGERY TO ENLARGE BLADDER   LAPAROSCOPIC GASTRIC SLEEVE RESECTION N/A 10/24/2021   Procedure: LAPAROSCOPIC SLEEVE GASTRECTOMY WITH HIATAL HERNIA REPAIR;  Surgeon: Signe Mitzie LABOR, MD;  Location: [facility];  Service: General;  Laterality: N/A;    OPEN SURGICAL REPAIR OF GLUTEAL TENDON Left 01/13/2024   Procedure: REPAIR, TENDON, GLUTEUS MEDIUS, OPEN;  Surgeon: Genelle Standing, MD;  Location: [facility];  Service: Orthopedics;  Laterality: Left;  LEFT GLUTEUS MAXIMUS TENDON TRANSFER   RIGHT/LEFT HEART CATH AND CORONARY ANGIOGRAPHY N/A 12/18/2019   Procedure: RIGHT/LEFT HEART CATH AND CORONARY ANGIOGRAPHY;  Surgeon: Dann Candyce RAMAN, MD;  Location: [facility] INVASIVE CV LAB;  Service: Cardiovascular;  Laterality: N/A;   TONSILLECTOMY     TOTAL HIP ARTHROPLASTY Right 04/14/2013   Procedure: TOTAL HIP ARTHROPLASTY ANTERIOR APPROACH;  Surgeon: Maude KANDICE Herald, MD;  Location: [facility];  Service: Orthopedics;  Laterality: Right;   UPPER GI ENDOSCOPY N/A 10/24/2021   Procedure: UPPER GI ENDOSCOPY;  Surgeon: Signe Mitzie LABOR, MD;  Location: [facility];  Service: General;  Laterality: N/A;   Social History   Socioeconomic History   Marital status: Married    Spouse name: Not on file   Number of children: Not on file   Years of education: Not on file   Highest education level: Not on file  Occupational History   Not on file  Tobacco Use   Smoking status: Never   Smokeless tobacco: Never  Vaping Use   Vaping status: Never Used  Substance and Sexual Activity   Alcohol use: Yes    Comment: glass of wine every now and then   Drug use: No   Sexual activity: Yes    Birth control/protection: Other-see comments  Other Topics Concern   Not on file  Social History Narrative   Not on file   Social Drivers of Health   Tobacco Use: Low Risk (01/28/2024)   Patient History    Smoking Tobacco Use: Never    Smokeless Tobacco Use: Never    Passive Exposure: Not on file  Financial Resource Strain: Not on file  Food Insecurity: No Food Insecurity (10/24/2021)   Hunger Vital Sign    Worried About Running Out of Food in the Last Year: Never true    Ran Out of Food in the Last Year: Never true  Transportation Needs: No Transportation  Needs (10/24/2021)   PRAPARE - Administrator, Civil Service (Medical): No    Lack of Transportation (Non-Medical): No  Physical Activity: Not on file  Stress: Not on file  Social Connections: Unknown (06/20/2021)   Received from [facility]   Social Network    Social Network: Not on file  Depression (PHQ2-9): Medium Risk (08/01/2021)   Depression (PHQ2-9)    PHQ-2 Score: 6  Alcohol Screen: Not on file  Housing: Low Risk (10/24/2021)   Housing    Last Housing Risk Score: 0  Utilities: Not At Risk (10/24/2021)   AHC Utilities    Threatened with loss of utilities: No  Health Literacy: Not on file   Family History  Family history unknown: Yes   Allergies[1] Current Outpatient Medications  Medication Sig Dispense Refill   acetaminophen  (TYLENOL ) 500 MG tablet Take 500 mg by mouth every 6 (six) hours as needed for moderate pain or mild pain.     aspirin  EC 325 MG tablet Take 1 tablet (325 mg total) by mouth daily. 14 tablet 0   aspirin  EC 81 MG tablet Take 1 tablet (81 mg total) by mouth daily. Swallow whole. 30 tablet 12   escitalopram  (LEXAPRO ) 20 MG tablet Take 20 mg by mouth daily.     gabapentin  (NEURONTIN ) 100 MG capsule Take 2 capsules (200 mg total) by mouth every 12 (twelve) hours. 20 capsule 0   magnesium  oxide (MAG-OX) 400 (240 Mg) MG tablet Take 500 mg by mouth daily.     Melatonin 5 MG CAPS Take 5 mg by mouth at bedtime.     nitroGLYCERIN  (NITROSTAT ) 0.4 MG SL tablet PLACE 1 TABLET (0.4 MG TOTAL) UNDER THE TONGUE EVERY FIVE MINUTES AS NEEDED FOR CHEST PAIN. 25 tablet 0   oxyCODONE  (ROXICODONE ) 5 MG immediate release tablet Take 1 tablet (5 mg total) by mouth every 4 (four) hours as needed for severe pain (pain score 7-10) or breakthrough pain. 30 tablet 0   sacubitril -valsartan  (ENTRESTO ) 24-26 MG Take 1 tablet by mouth 2 (two) times daily. 180 tablet 1   VITAMIN D, CHOLECALCIFEROL, PO Take by mouth.     No current facility-administered medications for this  visit.   No results found.  Review of Systems:   A ROS was performed including pertinent positives and negatives as documented in the HPI.   Musculoskeletal Exam:    Last menstrual period 02/05/2014.  Left hip incision is well-appearing without evidence of erythema or drainage.  Incision edges are well-approximated.  Patient is ambulating with assistance of 1 crutch.  Distal neurosensory exam  is intact.  Imaging:     Assessment:   Traci Roy is 3 weeks status post left gluteus maximus tendon transfer with trochanteric bursectomy overall progressing very well.  Sutures were removed today and the incision is very well-appearing with no concerns complication.  Pain is well-controlled at this time.  She will continue to work with physical therapy and progress weightbearing as tolerated.  Follow-up in another 3 weeks for reassessment.  Plan :    - Return to clinic in 3 weeks for next postop visit      I personally saw and evaluated the patient, and participated in the management and treatment plan.  Leonce Reveal, PA-C Orthopedics     [1]  Allergies Allergen Reactions   Lisinopril Cough   Adhesive [Tape] Itching and Rash    Itching and breaks out   "

## 2024-02-04 ENCOUNTER — Other Ambulatory Visit

## 2024-02-04 ENCOUNTER — Ambulatory Visit (HOSPITAL_BASED_OUTPATIENT_CLINIC_OR_DEPARTMENT_OTHER): Payer: Self-pay | Admitting: Physical Therapy

## 2024-02-04 ENCOUNTER — Encounter

## 2024-02-04 ENCOUNTER — Encounter (HOSPITAL_BASED_OUTPATIENT_CLINIC_OR_DEPARTMENT_OTHER): Payer: Self-pay | Admitting: Physical Therapy

## 2024-02-04 DIAGNOSIS — F4321 Adjustment disorder with depressed mood: Secondary | ICD-10-CM | POA: Diagnosis not present

## 2024-02-04 DIAGNOSIS — M6281 Muscle weakness (generalized): Secondary | ICD-10-CM | POA: Diagnosis not present

## 2024-02-04 DIAGNOSIS — R2689 Other abnormalities of gait and mobility: Secondary | ICD-10-CM | POA: Diagnosis not present

## 2024-02-04 DIAGNOSIS — R29898 Other symptoms and signs involving the musculoskeletal system: Secondary | ICD-10-CM

## 2024-02-04 DIAGNOSIS — S76012A Strain of muscle, fascia and tendon of left hip, initial encounter: Secondary | ICD-10-CM | POA: Diagnosis not present

## 2024-02-04 DIAGNOSIS — M25552 Pain in left hip: Secondary | ICD-10-CM

## 2024-02-04 NOTE — Therapy (Signed)
 " OUTPATIENT PHYSICAL THERAPY LOWER EXTREMITY EVALUATION   Patient Name: Traci Roy MRN: [id number] DOB:[birth year], 63 y.o., female Today's Date: 02/04/2024  END OF SESSION:  PT End of Session - 02/04/24 1352     Visit Number 3    Number of Visits 24    Date for Recertification  04/10/24    Authorization Type BLUE [employer]    PT Start Time 1350    PT Stop Time 1430    PT Time Calculation (min) 40 min    Activity Tolerance Patient tolerated treatment well    Behavior During Therapy WFL for tasks assessed/performed           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   CHF (congestive heart failure) (HCC)    Depression    takes Lexapro  daily   Dizziness    occasionally and no meds   Flu    end of Dec 2014   GERD (gastroesophageal reflux disease)    occasionally and no meds required   History of bronchitis    last time many yrs ago   History of duodenal ulcer    History of shingles 3-5 YRS AGO   Hypertension    takes Losartan  and HCTZ daily   Joint pain    Restless legs    Past Surgical History:  Procedure Laterality Date   BREAST ENHANCEMENT SURGERY Bilateral    CESAREAN SECTION     X 1   COLONOSCOPY     CORONARY PRESSURE/FFR STUDY N/A 12/18/2019   Procedure: INTRAVASCULAR PRESSURE WIRE/FFR STUDY;  Surgeon: Dann Candyce RAMAN, MD;  Location: [facility];  Service: Cardiovascular;  Laterality: N/A;   CORONARY STENT INTERVENTION N/A 12/18/2019   Procedure: CORONARY STENT INTERVENTION;  Surgeon: Dann Candyce RAMAN, MD;  Location: [facility] INVAS[facility] CV LAB;  Service: Cardiovascular;  Laterality: N/A;   enlarged bladder as a child     HAD SURGERY TO ENLARGE BLADDER   LAPAROSCOPIC GASTRIC SLEEVE RESECTION N/A 10/24/2021   Procedure: LAPAROSCOPIC SLEEVE GASTRECTOMY WITH HIATAL HERNIA REPAIR;  Surgeon: Signe Mitzie LABOR, MD;  Location: [facility];  Service: General;  Laterality: N/A;    OPEN SURGICAL REPAIR OF GLUTEAL TENDON Left 01/13/2024   Procedure: REPAIR, TENDON, GLUTEUS MEDIUS, OPEN;  Surgeon: Genelle Standing, MD;  Location: [facility];  Service: Orthopedics;  Laterality: Left;  LEFT GLUTEUS MAXIMUS TENDON TRANSFER   RIGHT/LEFT HEART CATH AND CORONARY ANGIOGRAPHY N/A 12/18/2019   Procedure: RIGHT/LEFT HEART CATH AND CORONARY ANGIOGRAPHY;  Surgeon: Dann Candyce RAMAN, MD;  Location: [facility];  Service: Cardiovascular;  Laterality: N/A;   TONSILLECTOMY     TOTAL HIP ARTHROPLASTY Right 04/14/2013   Procedure: TOTAL HIP ARTHROPLASTY ANTERIOR APPROACH;  Surgeon: Maude KANDICE Herald, MD;  Location: [facility];  Service: Orthopedics;  Laterality: Right;   UPPER GI ENDOSCOPY N/A 10/24/2021   Procedure: UPPER GI ENDOSCOPY;  Surgeon: Signe Mitzie LABOR, MD;  Location: [facility];  Service: General;  Laterality: N/A;   Patient Active Problem List   Diagnosis Date Noted   Tear of left gluteus medius tendon 01/13/2024   Panniculitis 03/12/2023   Back pain 03/12/2023   Impacted cerumen of both ears 03/09/2023   Chronic rhinitis 03/09/2023   Other specified disorders of eustachian tube, bilateral 03/09/2023   GERD (gastroesophageal reflux disease)    Preop cardiovascular exam 06/23/2021  Morbid obesity (HCC) 04/10/2021   Type 2 diabetes mellitus with complication, without long-term current use of insulin  (HCC) 04/10/2021   CAD (coronary artery disease) 11/06/2020   Left ventricular dysfunction 11/06/2020   Hyperlipidemia 11/06/2020   Angina pectoris    Essential hypertension 10/22/2019   Pneumonia due to COVID-19 virus 10/22/2019   Acute respiratory failure with hypoxia (HCC) 10/22/2019   AKI (acute kidney injury) 10/22/2019   Hypokalemia 10/22/2019   Degenerative joint disease (DJD) of hip 04/14/2013    PCP: Dyane Anthony RAMAN, FNP   REFERRING PROVIDER: Genelle Standing, MD  REFERRING DIAG: S76.012A (ICD-10-CM) - Tear of left gluteus medius tendon, initial  encounter  PROCEDURE: 1. Left hip gluteus maximus tendon transfer 2. Left hip open trochanteric bursectomy  THERAPY DIAG:  Other symptoms and signs involving the musculoskeletal system  Other abnormalities of gait and mobility  Muscle weakness (generalized)  Pain in left hip  Rationale for Evaluation and Treatment: Rehabilitation  ONSET DATE: DOS 01/13/24  SUBJECTIVE:   SUBJECTIVE STATEMENT:  Pt arrives with single crutch. Has some back pain along with hip pain. Has trouble sleeping in bed.   Eval: Patient states some soreness in hip. Has had a good bit of bleeding on dressing. Has been walking with crutches most of the time. She feels like she is getting around better.   PERTINENT HISTORY: CHF, hx depression/anxiety, HTN PAIN:  Are you having pain? Yes: NPRS scale: 3-4/10 Pain location: L hip Pain description: dull/sharp Aggravating factors: movement Relieving factors: rest  PRECAUTIONS: Other: glute repair  WEIGHT BEARING RESTRICTIONS: 50% weightbearing for total of 2 weeks  FALLS:  Has patient fallen in last 6 months? No  OCCUPATION: Retired  PLOF: Independent  PATIENT GOALS: get back to where she was before, wants to start exercising   OBJECTIVE: (objective measures from initial evaluation unless otherwise dated) OBSERVATION:  sutures intact, significant old drainage/blood but wet still   PATIENT SURVEYS:  LEFS  Extreme difficulty/unable (0), Quite a bit of difficulty (1), Moderate difficulty (2), Little difficulty (3), No difficulty (4) Survey date:  01/17/24  Any of your usual work, housework or school activities 0  2. Usual hobbies, recreational or sporting activities 0  3. Getting into/out of the bath 0  4. Walking between rooms 2  5. Putting on socks/shoes 2  6. Squatting  2  7. Lifting an object, like a bag of groceries from the floor 1  8. Performing light activities around your home 2  9. Performing heavy activities around your home 0  10.  Getting into/out of a car 1  11. Walking 2 blocks 0  12. Walking 1 mile 0  13. Going up/down 10 stairs (1 flight) 0  14. Standing for 1 hour 0  15.  sitting for 1 hour 2  16. Running on even ground 0  17. Running on uneven ground 0  18. Making sharp turns while running fast 0  19. Hopping  0  20. Rolling over in bed 1  Score total:  13/80     COGNITION: Overall cognitive status: Within functional limits for tasks assessed     SENSATION: WFL   PALPATION: Grossly tender throughout L hip  LOWER EXTREMITY ROM:  Passive ROM Right eval Left eval  Hip flexion  90  Hip extension    Hip abduction    Hip adduction    Hip internal rotation    Hip external rotation    Knee flexion    Knee extension  Ankle dorsiflexion    Ankle plantarflexion    Ankle inversion    Ankle eversion     (Blank rows = not tested) *= pain/symptoms  LOWER EXTREMITY MMT: NT fully due to post op status  MMT Right eval Left eval  Hip flexion    Hip extension    Hip abduction    Hip adduction    Hip internal rotation    Hip external rotation    Knee flexion    Knee extension    Ankle dorsiflexion    Ankle plantarflexion    Ankle inversion    Ankle eversion     (Blank rows = not tested) *= pain/symptoms    FUNCTIONAL TESTS:  NT due to post op status  GAIT: Distance walked: 50 feet Assistive device utilized: Crutches Level of assistance: Modified independence Comments: improved form following crutch adjustment and cueing   TODAY'S TREATMENT:                                                                                                                              DATE:  02/04/24 Hooklying ball squeeze 10  x 10 second holds Supine hamstring curls 2# 4 x 15 LAQ 2 x 10 with 5-10 second holds Gait with crutch/SPC with cueing for proper mechanics Standing weight shift with glute set 2 x 10   12/23 PROM L hip Hooklying adductor squeeze Hooklying glute sqz LAQ 2.5# 3x10 STS  elevated plinth x5 Standing HR x20 Nu-step x78min L3 Bandage change/inspection of incision.    01/17/24 Eval, education, HEP, dressing change  Crutch use and gait mechanics   PATIENT EDUCATION:  Education details: Patient educated on exam findings, POC, scope of PT, HEP, relevant anatomy and biomechanics, and protocol/precautions, dressing changes. Person educated: Patient Education method: Explanation, Demonstration, and Handouts Education comprehension: verbalized understanding, returned demonstration, verbal cues required, and tactile cues required  HOME EXERCISE PROGRAM: Access Code: [email or web address] URL: [email or web address] Date: 01/17/2024 Prepared by: Prentice Aubryn Spinola  Exercises - Gluteal Sets  - 3 x daily - 7 x weekly - 10 reps - 5-10 second hold - Supine Hip Adduction Isometric with Ball  - 3 x daily - 7 x weekly - 10 reps - 5-10 second hold - Seated Long Arc Quad  - 3 x daily - 7 x weekly - 3 sets - 10 reps - Prone Knee Flexion  - 3 x daily - 7 x weekly - 2 sets - 10 reps  ASSESSMENT:  CLINICAL IMPRESSION:  Patient now WBAT per last follow up with ortho. Continued with glute activation exercises which are tolerated well. Worked on animator with LRAD and cueing for mechanics. Patient will continue to benefit from physical therapy in order to improve function and reduce impairment.   Eval: Patient a 63 y.o. y.o. female who was seen today for physical therapy evaluation and treatment for Left hip gluteus maximus tendon transfer DOS 01/13/24. Patient presents with pain limited deficits in L  hip strength, ROM, endurance, activity tolerance, and functional mobility with ADL. Patient is having to modify and restrict ADL as indicated by outcome measure score as well as subjective information and objective measures which is affecting overall participation. Patient will benefit from skilled physical therapy in order to improve function and reduce  impairment.  OBJECTIVE IMPAIRMENTS: Abnormal gait, decreased activity tolerance, decreased balance, decreased endurance, decreased mobility, difficulty walking, decreased ROM, decreased strength, increased muscle spasms, impaired flexibility, improper body mechanics, and pain  ACTIVITY LIMITATIONS: lifting, bending, standing, squatting, stairs, transfers, bathing, dressing, hygiene/grooming, locomotion level, and caring for others  PARTICIPATION LIMITATIONS: meal prep, cleaning, laundry, shopping, community activity, occupation, and yard work  PERSONAL FACTORS: 3+ comorbidities: CHF, hx depression/anxiety, HTN are also affecting patient's functional outcome.   REHAB POTENTIAL: Good  CLINICAL DECISION MAKING: Evolving/moderate complexity  EVALUATION COMPLEXITY: Moderate   GOALS: Goals reviewed with patient? Yes  SHORT TERM GOALS: Target date: 02/28/2024    Patient will be independent with HEP in order to improve functional outcomes. Baseline: Goal status: MET 12/23  2.  Patient will report at least 25% improvement in symptoms for improved quality of life. Baseline: Goal status: INITIAL  3.  Pain free flexion to 90 Baseline:  Goal status: INITIAL  4.  SLS 30s without compensation Baseline:  Goal status: INITIAL    LONG TERM GOALS: Target date: 04/10/2024    Patient will report at least 75% improvement in symptoms for improved quality of life. Baseline:  Goal status: INITIAL  2.  Patient will improve LEFS score by at least 18 points in order to indicate improved tolerance to activity. Baseline:  Goal status: INITIAL  3.  Patient will be able to navigate stairs with reciprocal pattern without compensation in order to demonstrate improved LE strength. Baseline:  Goal status: INITIAL  4.  Patient will demonstrate MMT 80% of opposite lower extremity in all tested musculature as evidence of improved strength to assist with stair ambulation and gait. Baseline:  Goal  status: INITIAL  5.  Patient will be able to return to all activities unrestricted for improved ability to perform work functions and participate with family.  Baseline:  Goal status: INITIAL     PLAN:  PT FREQUENCY: 1-2x/week  PT DURATION: 12 weeks  PLANNED INTERVENTIONS: [id number]- PT Re-evaluation, 97110-Therapeutic exercises, 97530- Therapeutic activity, [id number]- Neuromuscular re-education, 97535- Self Care, [id number]- Manual therapy, [id number]- Gait training, [phone]- Orthotic Fit/training, [phone]- Canalith repositioning, [id number]- Aquatic Therapy, [phone]- Splinting, [phone]- Wound care (first 20 sq cm), 97598- Wound care (each additional 20 sq cm)Patient/Family education, Balance training, Stair training, Taping, Dry Needling, Joint mobilization, Joint manipulation, Spinal manipulation, Spinal mobilization, Scar mobilization, and DME instructions.  PLAN FOR NEXT SESSION: progress with hip gluteus maximus tendon transfer protocol   Prentice RAMAN Kelyn Ponciano, PT 02/04/2024, 1:52 PM  "

## 2024-02-12 ENCOUNTER — Encounter (HOSPITAL_BASED_OUTPATIENT_CLINIC_OR_DEPARTMENT_OTHER): Payer: Self-pay | Admitting: Physical Therapy

## 2024-02-17 ENCOUNTER — Ambulatory Visit (HOSPITAL_BASED_OUTPATIENT_CLINIC_OR_DEPARTMENT_OTHER): Payer: Self-pay | Attending: Orthopaedic Surgery

## 2024-02-17 ENCOUNTER — Encounter (HOSPITAL_BASED_OUTPATIENT_CLINIC_OR_DEPARTMENT_OTHER): Payer: Self-pay

## 2024-02-17 DIAGNOSIS — M6281 Muscle weakness (generalized): Secondary | ICD-10-CM | POA: Diagnosis present

## 2024-02-17 DIAGNOSIS — R29898 Other symptoms and signs involving the musculoskeletal system: Secondary | ICD-10-CM | POA: Insufficient documentation

## 2024-02-17 DIAGNOSIS — M25552 Pain in left hip: Secondary | ICD-10-CM | POA: Diagnosis present

## 2024-02-17 DIAGNOSIS — R2689 Other abnormalities of gait and mobility: Secondary | ICD-10-CM | POA: Diagnosis present

## 2024-02-17 NOTE — Therapy (Signed)
 " OUTPATIENT PHYSICAL THERAPY LOWER EXTREMITY EVALUATION   Patient Name: Traci Roy MRN: [id number] DOB:1960-07-11, 64 y.o., female Today's Date: 02/17/2024  END OF SESSION:  PT End of Session - 02/17/24 1302     Visit Number 4    Number of Visits 24    Date for Recertification  04/10/24    Authorization Type BLUE CROSS BLUE [employer]IELD    PT Start Time 1302    PT Stop Time 1348    PT Time Calculation (min) 46 min    Activity Tolerance Patient tolerated treatment well    Behavior During Therapy WFL for tasks assessed/performed            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   CHF (congestive heart failure) (HCC)    Depression    takes Lexapro  daily   Dizziness    occasionally and no meds   Flu    end of Dec 2014   GERD (gastroesophageal reflux disease)    occasionally and no meds required   History of bronchitis    last time many yrs ago   History of duodenal ulcer    History of shingles 3-5 YRS AGO   Hypertension    takes Losartan  and HCTZ daily   Joint pain    Restless legs    Past Surgical History:  Procedure Laterality Date   BREAST ENHANCEMENT SURGERY Bilateral    CESAREAN SECTION     X 1   COLONOSCOPY     CORONARY PRESSURE/FFR STUDY N/A 12/18/2019   Procedure: INTRAVASCULAR PRESSURE WIRE/FFR STUDY;  Surgeon: Dann Candyce RAMAN, MD;  Location: [facility];  Service: Cardiovascular;  Laterality: N/A;   CORONARY STENT INTERVENTION N/A 12/18/2019   Procedure: CORONARY STENT INTERVENTION;  Surgeon: Dann Candyce RAMAN, MD;  Location: [facility] INVASIVE CV LAB;  Service: Cardiovascular;  Laterality: N/A;   enlarged bladder as a child     HAD SURGERY TO ENLARGE BLADDER   LAPAROSCOPIC GASTRIC SLEEVE RESECTION N/A 10/24/2021   Procedure: LAPAROSCOPIC SLEEVE GASTRECTOMY WITH HIATAL HERNIA REPAIR;  Surgeon: Signe Mitzie LABOR, MD;  Location: [facility];  Service: General;  Laterality: N/A;    OPEN SURGICAL REPAIR OF GLUTEAL TENDON Left 01/13/2024   Procedure: REPAIR, TENDON, GLUTEUS MEDIUS, OPEN;  Surgeon: Genelle Standing, MD;  Location: [facility];  Service: Orthopedics;  Laterality: Left;  LEFT GLUTEUS MAXIMUS TENDON TRANSFER   RIGHT/LEFT HEART CATH AND CORONARY ANGIOGRAPHY N/A 12/18/2019   Procedure: RIGHT/LEFT HEART CATH AND CORONARY ANGIOGRAPHY;  Surgeon: Dann Candyce RAMAN, MD;  Location: [facility] INVASIVE CV LAB;  Service: Cardiovascular;  Laterality: N/A;   TONSILLECTOMY     TOTAL HIP ARTHROPLASTY Right 04/14/2013   Procedure: TOTAL HIP ARTHROPLASTY ANTERIOR APPROACH;  Surgeon: Maude KANDICE Herald, MD;  Location: [facility];  Service: Orthopedics;  Laterality: Right;   UPPER GI ENDOSCOPY N/A 10/24/2021   Procedure: UPPER GI ENDOSCOPY;  Surgeon: Signe Mitzie LABOR, MD;  Location: [facility];  Service: General;  Laterality: N/A;   Patient Active Problem List   Diagnosis Date Noted   Tear of left gluteus medius tendon 01/13/2024   Panniculitis 03/12/2023   Back pain 03/12/2023   Impacted cerumen of both ears 03/09/2023   Chronic rhinitis 03/09/2023   Other specified disorders of eustachian tube, bilateral 03/09/2023   GERD (gastroesophageal reflux disease)    Preop cardiovascular exam 06/23/2021  Morbid obesity (HCC) 04/10/2021   Type 2 diabetes mellitus with complication, without long-term current use of insulin  (HCC) 04/10/2021   CAD (coronary artery disease) 11/06/2020   Left ventricular dysfunction 11/06/2020   Hyperlipidemia 11/06/2020   Angina pectoris    Essential hypertension 10/22/2019   Pneumonia due to COVID-19 virus 10/22/2019   Acute respiratory failure with hypoxia (HCC) 10/22/2019   AKI (acute kidney injury) 10/22/2019   Hypokalemia 10/22/2019   Degenerative joint disease (DJD) of hip 04/14/2013    PCP: Dyane Anthony RAMAN, FNP   REFERRING PROVIDER: Genelle Standing, MD  REFERRING DIAG: S76.012A (ICD-10-CM) - Tear of left gluteus medius tendon, initial  encounter  PROCEDURE: 1. Left hip gluteus maximus tendon transfer 2. Left hip open trochanteric bursectomy  THERAPY DIAG:  Other symptoms and signs involving the musculoskeletal system  Other abnormalities of gait and mobility  Muscle weakness (generalized)  Pain in left hip  Rationale for Evaluation and Treatment: Rehabilitation  ONSET DATE: DOS 01/13/24  SUBJECTIVE:   SUBJECTIVE STATEMENT:  Pt continues to ambulate with single crutch. She states she will not be able to continue coming to PT and would like to know progressions she can work on.  I've been trying to watch Youtube videos for exercises.  Eval: Patient states some soreness in hip. Has had a good bit of bleeding on dressing. Has been walking with crutches most of the time. She feels like she is getting around better.   PERTINENT HISTORY: CHF, hx depression/anxiety, HTN PAIN:  Are you having pain? Yes: NPRS scale: 3-4/10 Pain location: L hip Pain description: dull/sharp Aggravating factors: movement Relieving factors: rest  PRECAUTIONS: Other: glute repair  WEIGHT BEARING RESTRICTIONS: 50% weightbearing for total of 2 weeks  FALLS:  Has patient fallen in last 6 months? No  OCCUPATION: Retired  PLOF: Independent  PATIENT GOALS: get back to where she was before, wants to start exercising   OBJECTIVE: (objective measures from initial evaluation unless otherwise dated) OBSERVATION:  sutures intact, significant old drainage/blood but wet still   PATIENT SURVEYS:  LEFS  Extreme difficulty/unable (0), Quite a bit of difficulty (1), Moderate difficulty (2), Little difficulty (3), No difficulty (4) Survey date:  01/17/24  Any of your usual work, housework or school activities 0  2. Usual hobbies, recreational or sporting activities 0  3. Getting into/out of the bath 0  4. Walking between rooms 2  5. Putting on socks/shoes 2  6. Squatting  2  7. Lifting an object, like a bag of groceries from the floor  1  8. Performing light activities around your home 2  9. Performing heavy activities around your home 0  10. Getting into/out of a car 1  11. Walking 2 blocks 0  12. Walking 1 mile 0  13. Going up/down 10 stairs (1 flight) 0  14. Standing for 1 hour 0  15.  sitting for 1 hour 2  16. Running on even ground 0  17. Running on uneven ground 0  18. Making sharp turns while running fast 0  19. Hopping  0  20. Rolling over in bed 1  Score total:  13/80     COGNITION: Overall cognitive status: Within functional limits for tasks assessed     SENSATION: WFL   PALPATION: Grossly tender throughout L hip  LOWER EXTREMITY ROM:  Passive ROM Right eval Left eval  Hip flexion  90  Hip extension    Hip abduction    Hip adduction    Hip  internal rotation    Hip external rotation    Knee flexion    Knee extension    Ankle dorsiflexion    Ankle plantarflexion    Ankle inversion    Ankle eversion     (Blank rows = not tested) *= pain/symptoms  LOWER EXTREMITY MMT: NT fully due to post op status  MMT Right eval Left eval  Hip flexion    Hip extension    Hip abduction    Hip adduction    Hip internal rotation    Hip external rotation    Knee flexion    Knee extension    Ankle dorsiflexion    Ankle plantarflexion    Ankle inversion    Ankle eversion     (Blank rows = not tested) *= pain/symptoms    FUNCTIONAL TESTS:  NT due to post op status  GAIT: Distance walked: 50 feet Assistive device utilized: Crutches Level of assistance: Modified independence Comments: improved form following crutch adjustment and cueing   TODAY'S TREATMENT:                                                                                                                              DATE:   1/12 PROM L hip Hooklying isometric adduction Hooklying isometric abduction 5 2x10 Prone hip extensoin 2x10 Prone HSC 2# x20 LAQ 2# 3x10 Gait with single crutch x143ft HEP update and  review   02/04/24 Hooklying ball squeeze 10  x 10 second holds Supine hamstring curls 2# 4 x 15 LAQ 2 x 10 with 5-10 second holds Gait with crutch/SPC with cueing for proper mechanics Standing weight shift with glute set 2 x 10   12/23 PROM L hip Hooklying adductor squeeze Hooklying glute sqz LAQ 2.5# 3x10 STS elevated plinth x5 Standing HR x20 Nu-step x87min L3 Bandage change/inspection of incision.    01/17/24 Eval, education, HEP, dressing change  Crutch use and gait mechanics   PATIENT EDUCATION:  Education details: Patient educated on exam findings, POC, scope of PT, HEP, relevant anatomy and biomechanics, and protocol/precautions, dressing changes. Person educated: Patient Education method: Explanation, Demonstration, and Handouts Education comprehension: verbalized understanding, returned demonstration, verbal cues required, and tactile cues required  HOME EXERCISE PROGRAM: Access Code: [email or web address] URL: [email or web address] Date: 01/17/2024 Prepared by: Prentice Zaunegger  Exercises - Gluteal Sets  - 3 x daily - 7 x weekly - 10 reps - 5-10 second hold - Supine Hip Adduction Isometric with Ball  - 3 x daily - 7 x weekly - 10 reps - 5-10 second hold - Seated Long Arc Quad  - 3 x daily - 7 x weekly - 3 sets - 10 reps - Prone Knee Flexion  - 3 x daily - 7 x weekly - 2 sets - 10 reps  ASSESSMENT:  CLINICAL IMPRESSION:  Educated pt on gentle progressions and expectations. Advised against watching youtube videos for exercises. Educated on healing timeline and restrictions. Worked on isometric strengthening  for hip abduction today with good tolerance. Pt stated she will likely attend PT a few more sessions. Pt tolerates protocol based strengthening well. Worked on AUTOMATIC DATA and strength of L hip. Pt limited primarily in hip ER at this time (as expected). She is 5 weeks s/p at this time.    Eval: Patient a 64 y.o. y.o. female who was seen today for physical  therapy evaluation and treatment for Left hip gluteus maximus tendon transfer DOS 01/13/24. Patient presents with pain limited deficits in L hip strength, ROM, endurance, activity tolerance, and functional mobility with ADL. Patient is having to modify and restrict ADL as indicated by outcome measure score as well as subjective information and objective measures which is affecting overall participation. Patient will benefit from skilled physical therapy in order to improve function and reduce impairment.  OBJECTIVE IMPAIRMENTS: Abnormal gait, decreased activity tolerance, decreased balance, decreased endurance, decreased mobility, difficulty walking, decreased ROM, decreased strength, increased muscle spasms, impaired flexibility, improper body mechanics, and pain  ACTIVITY LIMITATIONS: lifting, bending, standing, squatting, stairs, transfers, bathing, dressing, hygiene/grooming, locomotion level, and caring for others  PARTICIPATION LIMITATIONS: meal prep, cleaning, laundry, shopping, community activity, occupation, and yard work  PERSONAL FACTORS: 3+ comorbidities: CHF, hx depression/anxiety, HTN are also affecting patient's functional outcome.   REHAB POTENTIAL: Good  CLINICAL DECISION MAKING: Evolving/moderate complexity  EVALUATION COMPLEXITY: Moderate   GOALS: Goals reviewed with patient? Yes  SHORT TERM GOALS: Target date: 02/28/2024    Patient will be independent with HEP in order to improve functional outcomes. Baseline: Goal status: MET 12/23  2.  Patient will report at least 25% improvement in symptoms for improved quality of life. Baseline: Goal status: INITIAL  3.  Pain free flexion to 90 Baseline:  Goal status: INITIAL  4.  SLS 30s without compensation Baseline:  Goal status: INITIAL    LONG TERM GOALS: Target date: 04/10/2024    Patient will report at least 75% improvement in symptoms for improved quality of life. Baseline:  Goal status: INITIAL  2.  Patient  will improve LEFS score by at least 18 points in order to indicate improved tolerance to activity. Baseline:  Goal status: INITIAL  3.  Patient will be able to navigate stairs with reciprocal pattern without compensation in order to demonstrate improved LE strength. Baseline:  Goal status: INITIAL  4.  Patient will demonstrate MMT 80% of opposite lower extremity in all tested musculature as evidence of improved strength to assist with stair ambulation and gait. Baseline:  Goal status: INITIAL  5.  Patient will be able to return to all activities unrestricted for improved ability to perform work functions and participate with family.  Baseline:  Goal status: INITIAL     PLAN:  PT FREQUENCY: 1-2x/week  PT DURATION: 12 weeks  PLANNED INTERVENTIONS: [id number]- PT Re-evaluation, 97110-Therapeutic exercises, 97530- Therapeutic activity, [id number]- Neuromuscular re-education, 97535- Self Care, [id number]- Manual therapy, [id number]- Gait training, [phone]- Orthotic Fit/training, [phone]- Canalith repositioning, [id number]- Aquatic Therapy, [phone]- Splinting, [phone]- Wound care (first 20 sq cm), 97598- Wound care (each additional 20 sq cm)Patient/Family education, Balance training, Stair training, Taping, Dry Needling, Joint mobilization, Joint manipulation, Spinal manipulation, Spinal mobilization, Scar mobilization, and DME instructions.  PLAN FOR NEXT SESSION: progress with hip gluteus maximus tendon transfer protocol   Asberry BRAVO Carnita Golob, PTA 02/17/2024, 1:56 PM  "

## 2024-02-19 ENCOUNTER — Ambulatory Visit (HOSPITAL_BASED_OUTPATIENT_CLINIC_OR_DEPARTMENT_OTHER): Payer: Self-pay | Admitting: Physical Therapy

## 2024-02-24 ENCOUNTER — Ambulatory Visit (HOSPITAL_BASED_OUTPATIENT_CLINIC_OR_DEPARTMENT_OTHER): Payer: Self-pay

## 2024-02-26 ENCOUNTER — Encounter: Payer: Self-pay | Admitting: *Deleted

## 2024-02-26 ENCOUNTER — Ambulatory Visit (INDEPENDENT_AMBULATORY_CARE_PROVIDER_SITE_OTHER): Admitting: Student

## 2024-02-26 ENCOUNTER — Ambulatory Visit (HOSPITAL_BASED_OUTPATIENT_CLINIC_OR_DEPARTMENT_OTHER): Payer: Self-pay | Admitting: Physical Therapy

## 2024-02-26 ENCOUNTER — Telehealth (HOSPITAL_BASED_OUTPATIENT_CLINIC_OR_DEPARTMENT_OTHER): Payer: Self-pay | Admitting: Physical Therapy

## 2024-02-26 DIAGNOSIS — S76012A Strain of muscle, fascia and tendon of left hip, initial encounter: Secondary | ICD-10-CM

## 2024-02-26 NOTE — Telephone Encounter (Signed)
 Patient no show, left message for patient regarding missed appointment. Patient with no other PT appointments scheduled. Instructed her to call clinic if she would like to continue or d/c.  1:39 PM, 02/26/24 Prentice CANDIE Stains PT, DPT Physical Therapist at [facility]

## 2024-02-26 NOTE — Progress Notes (Signed)
 "                                Post Operative Evaluation    Procedure/Date of Surgery: Left hip gluteus maximus tendon transfer with trochanteric bursectomy 01/13/2024  Interval History:   Patient presents today 6 weeks status post left gluteus maximus tendon transfer and trochanteric bursectomy.  She does report continuing to improve and that pain is well-controlled without use pain medication.  She is still unable to lay on her left side.  She has been working with physical therapy.  Continues to use 1 crutch however states that she is able to get around some without it.   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   CHF (congestive heart failure) (HCC)    Depression    takes Lexapro  daily   Dizziness    occasionally and no meds   Flu    end of Dec 2014   GERD (gastroesophageal reflux disease)    occasionally and no meds required   History of bronchitis    last time many yrs ago   History of duodenal ulcer    History of shingles 3-5 YRS AGO   Hypertension    takes Losartan  and HCTZ daily   Joint pain    Restless legs    Past Surgical History:  Procedure Laterality Date   BREAST ENHANCEMENT SURGERY Bilateral    CESAREAN SECTION     X 1   COLONOSCOPY     CORONARY PRESSURE/FFR STUDY N/A 12/18/2019   Procedure: INTRAVASCULAR PRESSURE WIRE/FFR STUDY;  Surgeon: Dann Candyce RAMAN, MD;  Location: [facility];  Service: Cardiovascular;  Laterality: N/A;   CORONARY STENT INTERVENTION N/A 12/18/2019   Procedure: CORONARY STENT INTERVENTION;  Surgeon: Dann Candyce RAMAN, MD;  Location: [facility] INVASIVE CV LAB;  Service: Cardiovascular;  Laterality: N/A;   enlarged bladder as a child     HAD SURGERY TO ENLARGE BLADDER   LAPAROSCOPIC GASTRIC SLEEVE RESECTION N/A 10/24/2021   Procedure: LAPAROSCOPIC SLEEVE GASTRECTOMY WITH HIATAL HERNIA REPAIR;   Surgeon: Signe Mitzie LABOR, MD;  Location: [facility];  Service: General;  Laterality: N/A;   OPEN SURGICAL REPAIR OF GLUTEAL TENDON Left 01/13/2024   Procedure: REPAIR, TENDON, GLUTEUS MEDIUS, OPEN;  Surgeon: Genelle Standing, MD;  Location: [facility];  Service: Orthopedics;  Laterality: Left;  LEFT GLUTEUS MAXIMUS TENDON TRANSFER   RIGHT/LEFT HEART CATH AND CORONARY ANGIOGRAPHY N/A 12/18/2019   Procedure: RIGHT/LEFT HEART CATH AND CORONARY ANGIOGRAPHY;  Surgeon: Dann Candyce RAMAN, MD;  Location: [facility] INVASIVE CV LAB;  Service: Cardiovascular;  Laterality: N/A;   TONSILLECTOMY     TOTAL HIP ARTHROPLASTY Right 04/14/2013   Procedure: TOTAL HIP ARTHROPLASTY ANTERIOR APPROACH;  Surgeon: Maude KANDICE Herald, MD;  Location: [facility];  Service: Orthopedics;  Laterality: Right;   UPPER GI ENDOSCOPY N/A 10/24/2021   Procedure: UPPER GI ENDOSCOPY;  Surgeon: Signe Mitzie LABOR, MD;  Location: [facility];  Service: General;  Laterality: N/A;   Social History   Socioeconomic History   Marital status: Married    Spouse name: Not on file   Number of children: Not on file   Years of education: Not on file   Highest education level: Not on file  Occupational History   Not on file  Tobacco Use   Smoking status: Never   Smokeless tobacco: Never  Vaping Use   Vaping status: Never Used  Substance and Sexual Activity   Alcohol use: Yes    Comment: glass of wine every now and then   Drug use: No   Sexual activity: Yes    Birth control/protection: Other-see comments  Other Topics Concern   Not on file  Social History Narrative   Not on file   Social Drivers of Health   Tobacco Use: Low Risk (02/17/2024)   Patient History    Smoking Tobacco Use: Never    Smokeless Tobacco Use: Never    Passive Exposure: Not on file  Financial Resource Strain: Not on file  Food Insecurity: No Food Insecurity (10/24/2021)   Hunger Vital Sign    Worried About Running Out of Food in the Last Year: Never true    Ran  Out of Food in the Last Year: Never true  Transportation Needs: No Transportation Needs (10/24/2021)   PRAPARE - Administrator, Civil Service (Medical): No    Lack of Transportation (Non-Medical): No  Physical Activity: Not on file  Stress: Not on file  Social Connections: Unknown (06/20/2021)   Received [facility]   Social Network    Social Network: Not on file  Depression (PHQ2-9): Medium Risk (08/01/2021)   Depression (PHQ2-9)    PHQ-2 Score: 6  Alcohol Screen: Not on file  Housing: Low Risk (10/24/2021)   Housing    Last Housing Risk Score: 0  Utilities: Not At Risk (10/24/2021)   AHC Utilities    Threatened with loss of utilities: No  Health Literacy: Not on file   Family History  Family history unknown: Yes   Allergies[1] Current Outpatient Medications  Medication Sig Dispense Refill   acetaminophen  (TYLENOL ) 500 MG tablet Take 500 mg by mouth every 6 (six) hours as needed for moderate pain or mild pain.     aspirin  EC 325 MG tablet Take 1 tablet (325 mg total) by mouth daily. 14 tablet 0   aspirin  EC 81 MG tablet Take 1 tablet (81 mg total) by mouth daily. Swallow whole. 30 tablet 12   escitalopram  (LEXAPRO ) 20 MG tablet Take 20 mg by mouth daily.     gabapentin  (NEURONTIN ) 100 MG capsule Take 2 capsules (200 mg total) by mouth every 12 (twelve) hours. 20 capsule 0   magnesium  oxide (MAG-OX) 400 (240 Mg) MG tablet Take 500 mg by mouth daily.     Melatonin 5 MG CAPS Take 5 mg by mouth at bedtime.     nitroGLYCERIN  (NITROSTAT ) 0.4 MG SL tablet PLACE 1 TABLET (0.4 MG TOTAL) UNDER THE TONGUE EVERY FIVE MINUTES AS NEEDED FOR CHEST PAIN. 25 tablet 0   oxyCODONE  (ROXICODONE ) 5 MG immediate release tablet Take 1 tablet (5 mg total) by mouth every 4 (four) hours as needed for severe pain (pain score 7-10) or breakthrough pain. 30 tablet 0   sacubitril -valsartan  (ENTRESTO ) 24-26 MG Take 1 tablet by mouth 2 (two) times daily. 180 tablet 1   VITAMIN D,  CHOLECALCIFEROL, PO Take by mouth.     No current facility-administered medications for this visit.   No results found.  Review of Systems:   A ROS was performed including pertinent positives and negatives as documented in the HPI.   Musculoskeletal Exam:    Last menstrual period 02/05/2014.  Left hip incision is well-appearing without evidence of erythema or drainage.  Passive left hip range  of motion is to 120 degrees flexion and 20 degrees of internal and external rotation.  Distal neurosensory exam is intact.  Patient ambulating with 1 crutch.  Imaging:     Assessment:   Patient is 6 weeks status post left gluteus maximus tendon transfer and trochanteric bursectomy continuing to do well.  She is continuing to work with physical therapy per the rehab protocol and she expresses interest in decreasing visits or discontinuing although discussion was had regarding the importance of physical therapy in order to regain strength and function.  I would like for her to continue progressing into full weightbearing with discontinuation of crutches.  She will follow-up in another 6 weeks for next reassessment.  Plan :    - Return to clinic in 6 weeks for next visit      I personally saw and evaluated the patient, and participated in the management and treatment plan.  Leonce Reveal, PA-C Orthopedics     [1]  Allergies Allergen Reactions   Lisinopril Cough   Adhesive [Tape] Itching and Rash    Itching and breaks out   "

## 2024-02-28 ENCOUNTER — Ambulatory Visit: Attending: Emergency Medicine | Admitting: Emergency Medicine

## 2024-02-28 ENCOUNTER — Encounter: Payer: Self-pay | Admitting: Emergency Medicine

## 2024-02-28 VITALS — BP 108/72 | HR 70 | Ht 63.0 in | Wt 195.0 lb

## 2024-02-28 DIAGNOSIS — I5020 Unspecified systolic (congestive) heart failure: Secondary | ICD-10-CM | POA: Diagnosis not present

## 2024-02-28 DIAGNOSIS — E785 Hyperlipidemia, unspecified: Secondary | ICD-10-CM

## 2024-02-28 DIAGNOSIS — I2510 Atherosclerotic heart disease of native coronary artery without angina pectoris: Secondary | ICD-10-CM

## 2024-02-28 NOTE — Patient Instructions (Addendum)
 Medication Instructions:  NO CHANGES   Lab Work: NONE TO BE DONE TODAY.  Testing/Procedures: NONE  Follow-Up: At [facility], you and your health needs are our priority.  As part of our continuing mission to provide you with exceptional heart care, our providers are all part of one team.  This team includes your primary Cardiologist (physician) and Advanced Practice Providers or APPs (Physician Assistants and Nurse Practitioners) who all work together to provide you with the care you need, when you need it.  Your next appointment:   1 YEAR  Provider:   MADISON FOUNTAIN, DNP   Other Instructions:

## 2024-02-28 NOTE — Progress Notes (Signed)
 " Cardiology Office Note:    Date:  02/28/2024  ID:  Traci Roy, DOB 09/13/1960, MRN [id number] PCP: Freddrick, No  [facility] Providers Cardiologist:  Oneil Parchment, MD       Patient Profile:       Chief Complaint: 1 year follow-up History of Present Illness:  Traci Roy is a 64 y.o. female with visit-pertinent history of coronary artery disease s/p LAD stent in 2021, hypertension, diabetes, hyperlipidemia  Patient has history of coronary artery disease s/p DES to mid LAD on 12/2019.  Echocardiogram at the time of the catheterization showed LVEF 40 to 45%, global hypokinesis, RV function normal, no valvular abnormalities.  Echocardiogram 10/2020 showed unchanged LVEF of 40 to 45%.  Echocardiogram 01/2023 showed LVEF 50 to 55%, no RWMA, normal diastolic parameters, RV function and size normal, normal PASP, mild mitral valve regurgitation.  Patient was last seen in clinic on 12/21/2022.  She was stable with no symptoms.  No medication changes were made.  She was to follow-up in 1 year.   Discussed the use of AI scribe software for clinical note transcription with the patient, who gave verbal consent to proceed.  History of Present Illness Traci Roy is a 64 year old female with coronary artery disease and high cholesterol who presents for a one year follow-up.  Today patient is doing well without acute cardiovascular concerns.  She has no chest pain, shortness of breath, lightheadedness, or dizziness. She is active during the day but has limited ability to walk long distances while recovering from hip muscle repair surgery on December 8 and attending physical therapy. She gets mildly winded with longer walks but is otherwise stable.  She is without any orthopnea, PND, LEE, or weight gain.  She takes generic Entresto  and a daily baby aspirin . She is not taking cholesterol medication.   Review of systems:  Please see the history of present illness. All other systems are  reviewed and otherwise negative.      Studies Reviewed:    EKG Interpretation Date/Time:  Friday February 28 2024 08:39:40 EST Ventricular Rate:  61 PR Interval:  150 QRS Duration:  78 QT Interval:  420 QTC Calculation: 422 R Axis:   -12  Text Interpretation: Normal sinus rhythm Low voltage QRS Cannot rule out Anteroseptal infarct (cited on or before 09-Jan-2024) When compared with ECG of 09-Jan-2024 12:38, Questionable change in initial forces of Anterior leads Confirmed by Rana Dixon [phone]) on 02/28/2024 10:28:46 PM    Echocardiogram 02/01/2023  1. Left ventricular ejection fraction, by estimation, is 50 to 55%. Left  ventricular ejection fraction by 3D volume is 54 %. The left ventricle has  low normal function. The left ventricle has no regional wall motion  abnormalities. Left ventricular  diastolic parameters were normal.   2. Right ventricular systolic function is normal. The right ventricular  size is normal. There is normal pulmonary artery systolic pressure. The  estimated right ventricular systolic pressure is 19.8 mmHg.   3. The mitral valve is normal in structure. Mild mitral valve  regurgitation. No evidence of mitral stenosis.   4. The aortic valve is tricuspid. Aortic valve regurgitation is not  visualized. No aortic stenosis is present.   5. The inferior vena cava is normal in size with greater than 50%  respiratory variability, suggesting right atrial pressure of 3 mmHg.   Echocardiogram 10/28/2020  1. Left ventricular ejection fraction, by estimation, is 40 to 45%. The  left ventricle has mildly  decreased function. The left ventricle  demonstrates global hypokinesis. The left ventricular internal cavity size  was mildly dilated. Left ventricular  diastolic parameters are consistent with Grade I diastolic dysfunction  (impaired relaxation).   2. Right ventricular systolic function is normal. The right ventricular  size is normal.   3. The mitral valve  is normal in structure. Mild mitral valve  regurgitation. No evidence of mitral stenosis.   4. The aortic valve has an indeterminant number of cusps. Aortic valve  regurgitation is not visualized. No aortic stenosis is present.   5. The inferior vena cava is normal in size with greater than 50%  respiratory variability, suggesting right atrial pressure of 3 mmHg.   Cardiac catheterization 12/18/2019 2nd Diag lesion is 50% stenosed. Mid LAD lesion is 70% stenosed. Significant by DFR. A drug-eluting stent was successfully placed using a STENT RESOLUTE ONYX 3.0X26, postdilated to 3.75. Post intervention, there is a 0% residual stenosis. The left ventricular ejection fraction is 50-55% by visual estimate. LV end diastolic pressure is normal. The left ventricular systolic function is normal. There is no aortic valve stenosis. Ao sat 97%, PA sat 72%, PA pressure 35/14, mean PA pressure 24 mm Hg, mean PCWP 11 mm Hg; CO 6.3 L/min; CI 3.01 Normal right heart pressures. EBU 3.5 would have been a better choice of guide catheter.   Continue dual antiplatelet therapy along with aggressive secondary prevention.  Diagnostic Dominance: Right  Intervention   Echocardiogram 12/02/2019  1. Left ventricular ejection fraction, by estimation, is 40 to 45%. The  left ventricle has mildly decreased function. The left ventricle  demonstrates global hypokinesis. The left ventricular internal cavity size  was mildly dilated. Left ventricular  diastolic parameters were normal.   2. Right ventricular systolic function is normal. The right ventricular  size is normal. There is normal pulmonary artery systolic pressure.   3. Left atrial size was mildly dilated.   4. The mitral valve is normal in structure. No evidence of mitral valve  regurgitation. No evidence of mitral stenosis.   5. The aortic valve is tricuspid. Aortic valve regurgitation is not  visualized. Mild aortic valve sclerosis is present, with  no evidence of  aortic valve stenosis.   6. The inferior vena cava is normal in size with greater than 50%  respiratory variability, suggesting right atrial pressure of 3 mmHg.   Risk Assessment/Calculations:              Physical Exam:   VS:  BP 108/72   Pulse 70   Ht 5' 3 (1.6 m)   Wt 195 lb (88.5 kg)   LMP 02/05/2014   BMI 34.54 kg/m    Wt Readings from Last 3 Encounters:  02/28/24 195 lb (88.5 kg)  01/13/24 194 lb 3.6 oz (88.1 kg)  03/12/23 191 lb (86.6 kg)    GEN: Well nourished, well developed in no acute distress NECK: No JVD; No carotid bruits CARDIAC: RRR, no murmurs, rubs, gallops RESPIRATORY:  Clear to auscultation without rales, wheezing or rhonchi  ABDOMEN: Soft, non-tender, non-distended EXTREMITIES:  No edema; No acute deformity      Assessment and Plan:  Coronary artery disease S/p DES to mid LAD in 2021 - Today patient stable without chest pains.  She denies exertional chest discomfort or dyspnea.  She is without symptoms concerning for angina.  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  Signed, Lum LITTIE Louis, NP  "

## 2024-03-09 ENCOUNTER — Encounter

## 2024-03-09 ENCOUNTER — Other Ambulatory Visit

## 2024-04-08 ENCOUNTER — Encounter (HOSPITAL_BASED_OUTPATIENT_CLINIC_OR_DEPARTMENT_OTHER): Admitting: Orthopaedic Surgery
